# Patient Record
Sex: Male | Born: 1945 | Race: White | Hispanic: No | Smoking: Never smoker
Health system: Southern US, Community
[De-identification: ages and names within clinical notes are randomized; demographics above are authoritative.]

## PROBLEM LIST (undated history)

## (undated) DIAGNOSIS — F419 Anxiety disorder, unspecified: Secondary | ICD-10-CM

## (undated) DIAGNOSIS — I2089 Other forms of angina pectoris: Secondary | ICD-10-CM

## (undated) DIAGNOSIS — F32A Depression, unspecified: Secondary | ICD-10-CM

## (undated) DIAGNOSIS — I208 Other forms of angina pectoris: Secondary | ICD-10-CM

## (undated) DIAGNOSIS — F4310 Post-traumatic stress disorder, unspecified: Secondary | ICD-10-CM

## (undated) DIAGNOSIS — T7840XA Allergy, unspecified, initial encounter: Secondary | ICD-10-CM

## (undated) DIAGNOSIS — M545 Low back pain, unspecified: Secondary | ICD-10-CM

## (undated) DIAGNOSIS — I2510 Atherosclerotic heart disease of native coronary artery without angina pectoris: Secondary | ICD-10-CM

## (undated) DIAGNOSIS — R42 Dizziness and giddiness: Secondary | ICD-10-CM

## (undated) DIAGNOSIS — M1990 Unspecified osteoarthritis, unspecified site: Secondary | ICD-10-CM

## (undated) DIAGNOSIS — G9050 Complex regional pain syndrome I, unspecified: Secondary | ICD-10-CM

## (undated) DIAGNOSIS — N419 Inflammatory disease of prostate, unspecified: Secondary | ICD-10-CM

## (undated) DIAGNOSIS — L309 Dermatitis, unspecified: Secondary | ICD-10-CM

## (undated) DIAGNOSIS — M546 Pain in thoracic spine: Secondary | ICD-10-CM

## (undated) DIAGNOSIS — K219 Gastro-esophageal reflux disease without esophagitis: Secondary | ICD-10-CM

## (undated) HISTORY — DX: Dizziness and giddiness: R42

## (undated) HISTORY — PX: TONSILLECTOMY: SUR1361

## (undated) HISTORY — DX: Complex regional pain syndrome I, unspecified: G90.50

## (undated) HISTORY — DX: Low back pain, unspecified: M54.50

## (undated) HISTORY — DX: Allergy, unspecified, initial encounter: T78.40XA

## (undated) HISTORY — DX: Gastro-esophageal reflux disease without esophagitis: K21.9

## (undated) HISTORY — DX: Dermatitis, unspecified: L30.9

## (undated) HISTORY — DX: Pain in thoracic spine: M54.6

## (undated) HISTORY — DX: Depression, unspecified: F32.A

## (undated) HISTORY — DX: Other forms of angina pectoris: I20.8

## (undated) HISTORY — PX: NASAL SINUS SURGERY: SHX719

## (undated) HISTORY — PX: OTHER SURGICAL HISTORY: SHX169

## (undated) HISTORY — DX: Inflammatory disease of prostate, unspecified: N41.9

## (undated) HISTORY — PX: CHOLECYSTECTOMY: SHX55

## (undated) HISTORY — DX: Other forms of angina pectoris: I20.89

## (undated) HISTORY — PX: ESOPHAGEAL DILATION: SHX303

## (undated) HISTORY — DX: Low back pain: M54.5

## (undated) HISTORY — DX: Unspecified osteoarthritis, unspecified site: M19.90

## (undated) HISTORY — PX: BACK SURGERY: SHX140

---

## 1998-01-11 ENCOUNTER — Ambulatory Visit (HOSPITAL_COMMUNITY): Admission: RE | Admit: 1998-01-11 | Discharge: 1998-01-11 | Payer: Self-pay | Admitting: Neurological Surgery

## 1998-01-21 ENCOUNTER — Ambulatory Visit (HOSPITAL_COMMUNITY): Admission: RE | Admit: 1998-01-21 | Discharge: 1998-01-21 | Payer: Self-pay | Admitting: Neurological Surgery

## 1998-03-18 ENCOUNTER — Encounter: Admission: RE | Admit: 1998-03-18 | Discharge: 1998-06-16 | Payer: Self-pay | Admitting: Neurological Surgery

## 1998-04-12 ENCOUNTER — Encounter: Admission: RE | Admit: 1998-04-12 | Discharge: 1998-07-11 | Payer: Self-pay | Admitting: Anesthesiology

## 1998-10-09 ENCOUNTER — Encounter: Payer: Self-pay | Admitting: Neurological Surgery

## 1998-10-09 ENCOUNTER — Ambulatory Visit (HOSPITAL_COMMUNITY): Admission: RE | Admit: 1998-10-09 | Discharge: 1998-10-09 | Payer: Self-pay | Admitting: Neurological Surgery

## 1998-11-29 ENCOUNTER — Encounter: Admission: RE | Admit: 1998-11-29 | Discharge: 1999-02-27 | Payer: Self-pay

## 1998-12-12 ENCOUNTER — Encounter: Payer: Self-pay | Admitting: Emergency Medicine

## 1998-12-12 ENCOUNTER — Emergency Department (HOSPITAL_COMMUNITY): Admission: EM | Admit: 1998-12-12 | Discharge: 1998-12-12 | Payer: Self-pay | Admitting: Emergency Medicine

## 1999-02-21 ENCOUNTER — Encounter: Admission: RE | Admit: 1999-02-21 | Discharge: 1999-05-22 | Payer: Self-pay

## 2001-09-07 HISTORY — PX: OTHER SURGICAL HISTORY: SHX169

## 2016-07-21 ENCOUNTER — Ambulatory Visit: Payer: Self-pay | Admitting: Pediatrics

## 2016-09-22 ENCOUNTER — Ambulatory Visit (INDEPENDENT_AMBULATORY_CARE_PROVIDER_SITE_OTHER): Payer: Medicare Other | Admitting: Pediatrics

## 2016-09-22 ENCOUNTER — Encounter: Payer: Self-pay | Admitting: Pediatrics

## 2016-09-22 VITALS — BP 140/88 | HR 88 | Temp 98.0°F | Resp 24 | Ht 69.69 in | Wt 223.1 lb

## 2016-09-22 DIAGNOSIS — K219 Gastro-esophageal reflux disease without esophagitis: Secondary | ICD-10-CM | POA: Diagnosis not present

## 2016-09-22 DIAGNOSIS — L500 Allergic urticaria: Secondary | ICD-10-CM | POA: Diagnosis not present

## 2016-09-22 DIAGNOSIS — J3089 Other allergic rhinitis: Secondary | ICD-10-CM | POA: Diagnosis not present

## 2016-09-22 DIAGNOSIS — L282 Other prurigo: Secondary | ICD-10-CM

## 2016-09-22 DIAGNOSIS — I209 Angina pectoris, unspecified: Secondary | ICD-10-CM | POA: Diagnosis not present

## 2016-09-22 DIAGNOSIS — I208 Other forms of angina pectoris: Secondary | ICD-10-CM | POA: Diagnosis not present

## 2016-09-22 NOTE — Patient Instructions (Addendum)
Zyrtec 10 mg once a day for runny nose or itching Do foods with salicylates make you itch ? Environmental control of dust mite Triamcinolone 0.1% cream twice a day if needed to red itchy areas below the face Schedule your cardiac catheterization

## 2016-09-22 NOTE — Progress Notes (Signed)
[address] [place] [address] Dept: [phone]  New Patient Note  Patient ID: Cesar [name], Cesar [name]    DOB: 1946/04/05  Age: 71 y.o. MRN: [id number] Date of Office Visit: 09/22/2016 Referring provider: Brooke Bonito, MD No address on file    Chief Complaint: Pruritus (all over for about 20 years. sores on his legs and chest area. pt.states he noticed some itching when he came out of [place] exposed to agent orange.)  HPI Cesar [name] presents for evaluation of an itchy rash since 1970. He also has had eczema since then. He was exposed to agent orange in [place]. The itching comes and goes. He has had sinus problems for  several years which improved following sinus surgery. There are no clearcut symptoms to his rash. He has never had asthma. He has angina and has to take nitroglycerin 3 times a day. This may explain why his skin feels warm most of the time He was scheduled to have cardiac catheterization 2 months ago but it was canceled when he had a severe case of poison ivy.  Review of Systems  Constitutional:       Feels warm most of the time  HENT:       Sinus surgery. Occasional  nasal congestion and postnasal drainage. Tonsillectomy  Eyes: Negative.   Respiratory: Negative.   Cardiovascular:       Angina requiring nitroglycerin 3 times a day. He was scheduled for coronary angiography last fall but postponed because of poison ivy  Gastrointestinal:       Heartburn. 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 Carpal tunnel and  replacement of one joint in one thumb  Skin:       Itching and hives since 1970. History of eczema.  Neurological:       Lumbar surgery requiring a rod  Endo/Heme/Allergies:       No diabetes or thyroid disease  Psychiatric/Behavioral: Negative.     Outpatient Encounter Prescriptions as of 09/22/2016  Medication Sig  . acetaminophen (TYLENOL) 500 MG  tablet Take 500 mg by mouth every 6 (six) hours as needed.  . ALPRAZolam (XANAX) 0.5 MG tablet Take by mouth.  Marland Kitchen aspirin 81 MG chewable tablet Chew by mouth daily.  Marland Kitchen CALCIUM CARBONATE PO Take by mouth.  . Calcium-Vitamin D 600-200 MG-UNIT tablet Take by mouth.  . ciprofloxacin (CIPRO) 500 MG tablet Take 500 mg by mouth 2 (two) times daily. Pt. Takes as needed  . Naproxen Sodium (ALEVE PO) Take by mouth.  . neomycin-polymyxin-hydrocortisone (CORTISPORIN) 3.5-10000-1 otic suspension 4 drops two (2) times a day as needed (itching).  . nitroGLYCERIN (NITRODUR - DOSED IN MG/24 HR) 0.1 mg/hr patch Place onto the skin.  Marland Kitchen nitroGLYCERIN (NITROSTAT) 0.4 MG SL tablet Place 0.4 mg under the tongue.  Marland Kitchen omeprazole (PRILOSEC) 20 MG capsule Take by mouth.  . polyethylene glycol (MIRALAX / GLYCOLAX) packet 17 g by Does not apply route.  . triamcinolone cream (KENALOG) 0.1 % Apply to affected areas twice daily as needed  . [DISCONTINUED] aspirin EC 81 MG tablet Take by mouth.  . [DISCONTINUED] meloxicam (MOBIC) 7.5 MG tablet Take by mouth.   No facility-administered encounter medications on file as of 09/22/2016.      Drug Allergies:  Allergies  Allergen Reactions  . Formaldehyde Itching and Rash    SOLN  . Latex Itching  .  Sulfa Antibiotics Itching, Nausea And Vomiting and Rash    15 POWD SOLN  . Butorphanol Other (See Comments)    Drowsiness Drowsy Drowsiness  . Celecoxib Rash    Flushing Flushing  . Hydrocodone-Acetaminophen Other (See Comments) and Nausea And Vomiting  . Metformin Other (See Comments)    Unknown  . Oxycodone-Acetaminophen Nausea And Vomiting  . Povidone Iodine Rash  . Metformin Hcl Other (See Comments)    *blurred vision, 11/06/2009  . Simvastatin Other (See Comments)    Myalgias  . Tamsulosin Other (See Comments)    Dyspnea  . Nifedipine Nausea Only  . Nitroglycerin Rash    Nitroglycerin patch developed rash and blisters from adhesive and latex  . Povidone-Iodine  Rash  . Quaternium-15 Rash  . Rofecoxib Nausea Only    Flushing  . Tape Itching and Rash    Family History: Viyan's family history is not on file..Family history is negative for asthma, hayfever, sinus problems, angioedema, eczema, hives, and food allergies or lupus.  Social and environmental. There are no pets in the home. He is not exposed to cigarette smoking. He has never smoked cigarettes in the past. He was exposed to agent orange in the 1960s when he was in [place]  Physical Exam: BP 140/88 (BP Location: Right Arm, Patient Position: Sitting, Cuff Size: Normal)   Pulse 88   Temp 98 F (36.7 C) (Oral)   Resp (!) 24   Ht 5' 9.69" (1.77 m)   Wt 223 lb 1.7 oz (101.2 kg)   BMI 32.30 kg/m    Physical Exam  Constitutional: He is oriented to person, place, and time. He appears well-developed and well-nourished.  HENT:  Eyes normal. Ears normal. Nose normal. Pharynx normal.  Neck: Neck supple. No thyromegaly present.  Cardiovascular:  S1 and S2 normal no murmurs  Pulmonary/Chest:  Clear to percussion and  auscultation  Abdominal: Soft. There is no tenderness (no hepatosplenomegaly).  Neurological: He is alert and oriented to person, place, and time.  Skin:  Clear except for a few papular urticaria lesions. He also had dermographia  Psychiatric: He has a normal mood and affect. His behavior is normal. Judgment and thought content normal.  Vitals reviewed.   Diagnostics:  Allergy skin tests were positive to grass pollen and dust mites. Skin testing to foods was negative  Assessment  Assessment and Plan: 1. Allergic urticaria   2. Other allergic rhinitis   3. Angina at rest [facility](HCC)   4. Gastroesophageal reflux disease without esophagitis   5. Papular urticaria     No orders of the defined types were placed in this encounter.   Patient Instructions  Zyrtec 10 mg once a day for runny nose or itching Do foods with salicylates make you itch ? Environmental control of  dust mite Triamcinolone 0.1% cream twice a day if needed to red itchy areas below the face Schedule your cardiac catheterization   Return in about 4 weeks (around 10/20/2016).   Thank you for the opportunity to care for this patient.  Please do not hesitate to contact me with questions.  Tonette BihariJ. A. Sydny Schnitzler, M.D.  [facility] [address] [place], [place] [address] [phone]

## 2016-09-25 DIAGNOSIS — K219 Gastro-esophageal reflux disease without esophagitis: Secondary | ICD-10-CM | POA: Insufficient documentation

## 2016-09-25 DIAGNOSIS — J3089 Other allergic rhinitis: Secondary | ICD-10-CM | POA: Insufficient documentation

## 2016-09-25 DIAGNOSIS — L500 Allergic urticaria: Secondary | ICD-10-CM | POA: Insufficient documentation

## 2016-09-25 DIAGNOSIS — I208 Other forms of angina pectoris: Secondary | ICD-10-CM | POA: Insufficient documentation

## 2016-10-26 ENCOUNTER — Ambulatory Visit (INDEPENDENT_AMBULATORY_CARE_PROVIDER_SITE_OTHER): Payer: Medicare Other | Admitting: Pediatrics

## 2016-10-26 ENCOUNTER — Encounter: Payer: Self-pay | Admitting: Pediatrics

## 2016-10-26 ENCOUNTER — Ambulatory Visit: Payer: Medicare Other | Admitting: Pediatrics

## 2016-10-26 VITALS — BP 138/86 | HR 84 | Temp 98.2°F | Resp 18

## 2016-10-26 DIAGNOSIS — L503 Dermatographic urticaria: Secondary | ICD-10-CM | POA: Diagnosis not present

## 2016-10-26 DIAGNOSIS — L282 Other prurigo: Secondary | ICD-10-CM | POA: Diagnosis not present

## 2016-10-26 DIAGNOSIS — K219 Gastro-esophageal reflux disease without esophagitis: Secondary | ICD-10-CM | POA: Diagnosis not present

## 2016-10-26 DIAGNOSIS — I208 Other forms of angina pectoris: Secondary | ICD-10-CM | POA: Diagnosis not present

## 2016-10-26 DIAGNOSIS — L500 Allergic urticaria: Secondary | ICD-10-CM

## 2016-10-26 DIAGNOSIS — I209 Angina pectoris, unspecified: Secondary | ICD-10-CM | POA: Diagnosis not present

## 2016-10-26 NOTE — Progress Notes (Signed)
[address] [place] [address] Dept: [phone]  FOLLOW UP NOTE  Patient ID: Cesar [name], male    DOB: 1946/05/09  Age: 71 y.o. MRN: [id number] Date of Office Visit: 10/26/2016  Assessment  Chief Complaint: Follow-up (pt is doing well)  HPI Cesar PortsDonald C Vanschaick presents for follow-up of itchy skin rash. His symptoms are somewhat improved. He has been able to cut back nitroglycerin to twice a day. He has not had a cardiac catheterization as recommended by his cardiologist. He did find that foods with a lot of acids did make his skin itch more.  Current medications are Zyrtec 10 mg once a day and triamcinolone 0.1% cream twice a day if needed to red itchy areas below the face   Drug Allergies:  Allergies  Allergen Reactions  . Formaldehyde Itching and Rash    SOLN  . Latex Itching  . Sulfa Antibiotics Itching, Nausea And Vomiting and Rash    15 POWD SOLN  . Butorphanol Other (See Comments)    Drowsiness Drowsy Drowsiness  . Celecoxib Rash    Flushing Flushing  . Hydrocodone-Acetaminophen Other (See Comments) and Nausea And Vomiting  . Metformin Other (See Comments)    Unknown  . Oxycodone-Acetaminophen Nausea And Vomiting  . Povidone Iodine Rash  . Metformin Hcl Other (See Comments)    *blurred vision, 11/06/2009  . Simvastatin Other (See Comments)    Myalgias  . Tamsulosin Other (See Comments)    Dyspnea  . Nifedipine Nausea Only  . Nitroglycerin Rash    Nitroglycerin patch developed rash and blisters from adhesive and latex  . Povidone-Iodine Rash  . Quaternium-15 Rash  . Rofecoxib Nausea Only    Flushing  . Tape Itching and Rash    Physical Exam: BP 138/86   Pulse 84   Temp 98.2 F (36.8 C) (Oral)   Resp 18   SpO2 97%    Physical Exam  Constitutional: He is oriented to person, place, and time. He appears well-developed and well-nourished.  HENT:  Eyes normal. Ears normal. Nose normal. Pharynx normal.  Neck: Neck supple.  Cardiovascular:    S1 and S2 normal no murmurs  Pulmonary/Chest:  Clear to percussion and auscultation  Lymphadenopathy:    He has no cervical adenopathy.  Neurological: He is alert and oriented to person, place, and time.  Skin:  IA Few 2 mm papular erythematous areas in the back  Psychiatric: He has a normal mood and affect. His behavior is normal. Judgment and thought content normal.  Vitals reviewed.   Diagnostics:  none  Assessment and Plan: 1. Allergic urticaria   2. Dermographia   3. Papular urticaria   4. Angina at rest [facility](HCC)   5. Gastroesophageal reflux disease without esophagitis     No orders of the defined types were placed in this encounter.   Patient Instructions  Zyrtec 10 mg once a day Triamcinolone 0.1% cream twice a day if needed to red itchy areas below the face Avoid foods with a lot of acids Triamcinolone 0.1% cream twice a day if needed to red itchy areas Follow-up about a month after his cardiac catheterization which he plans to do within a month Continue on his other medications   Return in about 8 weeks (around 12/21/2016).    Thank you for the opportunity to care for this patient.  Please do not hesitate to contact me with questions.  Tonette BihariJ. A. Bardelas, M.D.  [facility] [place] [address]  [place],  [address] [phone]

## 2016-10-26 NOTE — Patient Instructions (Addendum)
Zyrtec 10 mg once a day Triamcinolone 0.1% cream twice a day if needed to red itchy areas below the face Avoid foods with a lot of acids Triamcinolone 0.1% cream twice a day if needed to red itchy areas Follow-up about a month after his cardiac catheterization which he plans to do within a month Continue on his other medications

## 2020-07-08 ENCOUNTER — Other Ambulatory Visit: Payer: Self-pay

## 2020-07-08 ENCOUNTER — Encounter: Payer: Self-pay | Admitting: Family Medicine

## 2020-07-08 ENCOUNTER — Ambulatory Visit (INDEPENDENT_AMBULATORY_CARE_PROVIDER_SITE_OTHER): Payer: Medicare Other | Admitting: Family Medicine

## 2020-07-08 VITALS — BP 130/72 | HR 69 | Temp 97.7°F | Ht 71.0 in | Wt 197.1 lb

## 2020-07-08 DIAGNOSIS — M545 Low back pain, unspecified: Secondary | ICD-10-CM | POA: Diagnosis not present

## 2020-07-08 DIAGNOSIS — G8929 Other chronic pain: Secondary | ICD-10-CM

## 2020-07-08 DIAGNOSIS — Z634 Disappearance and death of family member: Secondary | ICD-10-CM

## 2020-07-08 DIAGNOSIS — F411 Generalized anxiety disorder: Secondary | ICD-10-CM

## 2020-07-08 DIAGNOSIS — F4310 Post-traumatic stress disorder, unspecified: Secondary | ICD-10-CM | POA: Diagnosis not present

## 2020-07-08 MED ORDER — ESCITALOPRAM OXALATE 10 MG PO TABS: 10.0000 mg | ORAL_TABLET | Freq: Every day | ORAL | 3 refills | Status: DC

## 2020-07-08 NOTE — Patient Instructions (Addendum)
You are on quite a bit of Xanax. I want you to use it only when you need it rather than scheduled 3 times daily.   Aim to do some physical exertion for 150 minutes per week. This is typically divided into 5 days per week, 30 minutes per day. The activity should be enough to get your heart rate up. Anything is better than nothing if you have time constraints.  Please consider counseling. Contact [phone] to schedule an appointment or inquire about cost/insurance coverage.  Coping skills Choose 5 that work for you:  Take a deep breath  Count to 20  Read a book  Do a puzzle  Meditate  Bake  Sing  Knit  Garden  Pray  Go outside  Call a friend  Listen to music  Take a walk  Color  Send a note  Take a bath  Watch a movie  Be alone in a quiet place  Pet an animal  Visit a friend  Journal  Exercise  Stretch    [facility] [address] [place], [place] [address] [phone]  [facility] [address] [place], [place] [address] [phone]  [facility] health [address] [place], [place] [address] [phone]  [facility] [address], [place], [place], #[phone] [address], [place], [place], #[phone]  [facility] [address] [place], [place] [address] [phone]  [facility] [address] [place], [place] [phone]  [facility] Department [address] [place], [place] [phone]  Call one of these offices sooner than later as it can take 2-3 months to get a new patient appointment.
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
## 2020-07-10 ENCOUNTER — Telehealth: Payer: Self-pay | Admitting: Family Medicine

## 2020-07-10 MED ORDER — VENLAFAXINE HCL ER 37.5 MG PO CP24: 37.5000 mg | ORAL_CAPSULE | Freq: Every day | ORAL | 3 refills | Status: DC

## 2020-07-10 NOTE — Telephone Encounter (Signed)
I will send something else in. He stopped the Prozac though, correct?

## 2020-07-10 NOTE — Telephone Encounter (Signed)
Called the patient to inform. YES he has stopped the prozac He verbalized understanding.

## 2020-07-10 NOTE — Telephone Encounter (Signed)
Patient states he is not able to take escitalopram (LEXAPRO) 10 MG tablet   it caused heart racing lack of sleep Hot Flashes, caused light diarrhea  and will not be able to take it.

## 2020-07-11 ENCOUNTER — Telehealth: Payer: Self-pay | Admitting: Family Medicine

## 2020-07-11 NOTE — Telephone Encounter (Signed)
Patient states he has researched venlafine xr and its the same type of medication as the previous. Patient state he had a conversation with the pharmacist and he suggested maybe he had serotonin syndrome and that is the reason why he gets the side effects.  Patient states whenever he is about to get a panic/anxiety attack, he takes xanax and that seems to help. He is wondering if he should only continue to take xanax.

## 2020-07-12 NOTE — Telephone Encounter (Signed)
He states he had only been on prozac not even a couple months. He states in the past 3 days has taken nothing (only xanax if needed) He has had no sweats/heart racing--states feeling much better. He prefers to stay off everything other than xanax as needed but is feeling much better.

## 2020-07-12 NOTE — Telephone Encounter (Signed)
Prozac is also in the same family which is why I thought it was medication specific. He is free to stay on Xanax only, but if that is the case I will be referring to the psychiatry team to further manage because I do not think things are controlled, nor is taking that much Xanax per day my goal for him. Ty.

## 2020-07-29 ENCOUNTER — Telehealth: Payer: Self-pay

## 2020-07-29 ENCOUNTER — Ambulatory Visit (INDEPENDENT_AMBULATORY_CARE_PROVIDER_SITE_OTHER): Payer: Medicare Other | Admitting: Family Medicine

## 2020-07-29 ENCOUNTER — Telehealth: Payer: Self-pay | Admitting: *Deleted

## 2020-07-29 ENCOUNTER — Encounter: Payer: Self-pay | Admitting: Family Medicine

## 2020-07-29 ENCOUNTER — Other Ambulatory Visit: Payer: Self-pay

## 2020-07-29 VITALS — BP 128/72 | HR 78 | Temp 97.7°F | Ht 72.0 in | Wt 193.4 lb

## 2020-07-29 DIAGNOSIS — F411 Generalized anxiety disorder: Secondary | ICD-10-CM

## 2020-07-29 DIAGNOSIS — F4310 Post-traumatic stress disorder, unspecified: Secondary | ICD-10-CM

## 2020-07-29 MED ORDER — HYDROXYZINE HCL 25 MG PO TABS: 12.5000 mg | ORAL_TABLET | Freq: Three times a day (TID) | ORAL | 1 refills | Status: DC | PRN

## 2020-07-29 MED ORDER — NORTRIPTYLINE HCL 10 MG PO CAPS: 10.0000 mg | ORAL_CAPSULE | Freq: Every day | ORAL | 2 refills | Status: DC

## 2020-07-29 NOTE — Telephone Encounter (Signed)
Patient scheduled with you this afternoon at 245pm

## 2020-07-29 NOTE — Telephone Encounter (Addendum)
Contact Type Call Who Is Calling Patient / Member / Family / Caregiver Call Type Triage / Clinical Relationship To Patient Self Return Phone Number [phone] (Primary) Chief Complaint Anxiety and Panic Attack Reason for Call Request to Schedule Office Appointment Initial Comment Caller states he wanted to know if he can get an appointment on today if possible. Caller states he is having serve anxiety this morning. Caller states the anxierty began after his wife passed in July. Translation No Nurse Assessment Nurse: Vear Clock, RN, Elease Hashimoto Date/Time Cesar [name] Time): 07/29/2020 8:35:29 AM Confirm and document reason for call. If symptomatic, describe symptoms. ---He would like an appointment on today if possible. He is having serve anxiety this morning. Caller states the anxiety began after his wife passed in July. He has seen the MD for it and the MD gave him some medication but it did not help . He states it made hi "heart race". He does take Xanax but it does not help with the anxiety.

## 2020-07-29 NOTE — Telephone Encounter (Signed)
PA initiated via Covermymeds; KEY:  [id number]. Awaiting determination.

## 2020-07-29 NOTE — Patient Instructions (Signed)
Please consider counseling. Contact [phone] to schedule an appointment or inquire about cost/insurance coverage.  Aim to do some physical exertion for 150 minutes per week. This is typically divided into 5 days per week, 30 minutes per day. The activity should be enough to get your heart rate up. Anything is better than nothing if you have time constraints.  [facility] [address] [place], [place] [address] [phone]  [facility] [address] [place], [place] [address] [phone]  [facility] health [address] [place], [place] [address] [phone]  [facility] Medicine [address], [place], [place], #[phone] [address], [place], [place], #[phone]  [facility] [address] [place], [place] [address] [phone]  [facility] and Counseling [address] [place], [place] [phone]  [facility] [address] [place], [place] [phone]  Call one of these offices sooner than later as it can take 2-3 months to get a new patient appointment.

## 2020-07-29 NOTE — Progress Notes (Signed)
Chief Complaint  Patient presents with  . Anxiety    Subjective Cesar [name] presents for f/u anxiety/PTSD.  Pt is currently being treated with: nothing, just stopped Prozac, Effexor and Lexapro; is on Xanax prn.  Reports lots of anxiety and panic attacks since treatment. No thoughts of harming self or others. No self-medication with alcohol, prescription drugs or illicit drugs. Pt is not following with a counselor/psychologist.  Past Medical History:  Diagnosis Date  . Allergy   . Angina at rest [facility]   . Arthritis   . Back pain, lumbosacral   . Back pain, thoracic   . Depression   . Eczema   . GERD (gastroesophageal reflux disease)   . Prostatitis   . RSD (reflex sympathetic dystrophy)    on the right side  . Vertigo    Allergies as of 07/29/2020      Reactions   Formaldehyde Itching, Rash   SOLN   Latex Itching   Sulfa Antibiotics Itching, Nausea And Vomiting, Rash   15 POWD SOLN   Butorphanol Other (See Comments)   Drowsiness Drowsy Drowsiness   Celecoxib Rash   Flushing Flushing   Hydrocodone-acetaminophen Other (See Comments), Nausea And Vomiting   Metformin Other (See Comments)   Unknown   Oxycodone-acetaminophen Nausea And Vomiting   Povidone Iodine Rash   Metformin Hcl Other (See Comments)   *blurred vision, 11/06/2009   Simvastatin Other (See Comments)   Myalgias   Tamsulosin Other (See Comments)   Dyspnea   Nifedipine Nausea Only   Nitroglycerin Rash   Nitroglycerin patch developed rash and blisters from adhesive and latex   Povidone-iodine Rash   Quaternium-15 Rash   Rofecoxib Nausea Only   Flushing   Tape Itching, Rash      Medication List       Accurate as of July 29, 2020  3:55 PM. If you have any questions, ask your nurse or doctor.        STOP taking these medications   ALPRAZolam 0.5 MG tablet Commonly known as: XANAX Stopped by: Sharlene Dory, DO   venlafaxine XR 37.5 MG 24 hr capsule Commonly known as:  Effexor XR Stopped by: Sharlene Dory, DO     TAKE these medications   aspirin 81 MG chewable tablet Chew by mouth daily.   Calcium-Vitamin D 600-200 MG-UNIT tablet Take by mouth.   ciprofloxacin 500 MG tablet Commonly known as: CIPRO Take 500 mg by mouth 2 (two) times daily. Pt. Takes as needed   hydrOXYzine 25 MG tablet Commonly known as: ATARAX/VISTARIL Take 0.5-1 tablets (12.5-25 mg total) by mouth every 8 (eight) hours as needed for anxiety. Started by: Sharlene Dory, DO   metoprolol tartrate 25 MG tablet Commonly known as: LOPRESSOR Take 1/2 twice daily   nitroGLYCERIN 0.4 MG SL tablet Commonly known as: NITROSTAT Place 0.4 mg under the tongue.   nortriptyline 10 MG capsule Commonly known as: Pamelor Take 1 capsule (10 mg total) by mouth at bedtime. Started by: Sharlene Dory, DO   omeprazole 20 MG capsule Commonly known as: PRILOSEC Take by mouth.   polyethylene glycol 17 g packet Commonly known as: MIRALAX / GLYCOLAX 17 g by Does not apply route.   triamcinolone 0.1 % Commonly known as: KENALOG Apply to affected areas twice daily as needed   TYLENOL 500 MG tablet Generic drug: acetaminophen Take 500 mg by mouth every 6 (six) hours as needed.       Exam BP 128/72 (BP  Location: Left Arm, Patient Position: Sitting, Cuff Size: Normal)   Pulse 78   Temp 97.7 F (36.5 C) (Oral)   Ht 6' (1.829 m)   Wt 193 lb 6 oz (87.7 kg)   SpO2 98%   BMI 26.23 kg/m  General:  well developed, well nourished, in no apparent distress Lungs:  No respiratory distress Psych: well oriented with normal range of affect and age-appropriate judgement/insight, alert and oriented x4.  Assessment and Plan  PTSD (post-traumatic stress disorder) - Plan: hydrOXYzine (ATARAX/VISTARIL) 25 MG tablet, nortriptyline (PAMELOR) 10 MG capsule  GAD (generalized anxiety disorder) - Plan: hydrOXYzine (ATARAX/VISTARIL) 25 MG tablet, nortriptyline (PAMELOR) 10 MG  capsule  Status: Chronic, uncontrolled.  Stop Effexor, start nortriptyline 10 mg daily.  We will also stop alprazolam and start hydroxyzine 12.5-25 mg every 8 hours as needed for anxiety/panic attacks.  I also gave him psychiatry resources to reach out to.  It should take around 3 months to get and so we will see him back in around 1 month.  Could consider Abilify versus BuSpar as an adjunct versus mirtazapine. The patient voiced understanding and agreement to the plan.  [name] [place], DO 07/29/20 3:55 PM

## 2020-07-29 NOTE — Telephone Encounter (Addendum)
PA approved. Effective 04/30/2020 to 07/29/2021

## 2020-08-07 ENCOUNTER — Ambulatory Visit: Payer: Medicare Other | Admitting: Family Medicine

## 2020-08-14 ENCOUNTER — Telehealth: Payer: Self-pay | Admitting: Family Medicine

## 2020-08-14 NOTE — Telephone Encounter (Signed)
Patient states he has been having lots of anxiety, nervousness. Patient states he is not able to sleep. He is not sure the medication is helping. He is wondering if maybe medication needs to be adjusted.

## 2020-08-14 NOTE — Telephone Encounter (Signed)
Called and scheduled appt for 08/19/20

## 2020-08-14 NOTE — Telephone Encounter (Signed)
Schedule in next week or so. Ty.

## 2020-08-19 ENCOUNTER — Other Ambulatory Visit (HOSPITAL_BASED_OUTPATIENT_CLINIC_OR_DEPARTMENT_OTHER): Payer: Self-pay | Admitting: Internal Medicine

## 2020-08-19 ENCOUNTER — Other Ambulatory Visit: Payer: Self-pay

## 2020-08-19 ENCOUNTER — Ambulatory Visit: Payer: Medicare Other | Attending: Internal Medicine

## 2020-08-19 ENCOUNTER — Encounter: Payer: Self-pay | Admitting: Family Medicine

## 2020-08-19 ENCOUNTER — Ambulatory Visit (INDEPENDENT_AMBULATORY_CARE_PROVIDER_SITE_OTHER): Payer: Medicare Other | Admitting: Family Medicine

## 2020-08-19 VITALS — BP 134/74 | HR 78 | Temp 98.3°F | Ht 72.0 in | Wt 190.4 lb

## 2020-08-19 DIAGNOSIS — Z23 Encounter for immunization: Secondary | ICD-10-CM

## 2020-08-19 DIAGNOSIS — F411 Generalized anxiety disorder: Secondary | ICD-10-CM

## 2020-08-19 DIAGNOSIS — F4310 Post-traumatic stress disorder, unspecified: Secondary | ICD-10-CM | POA: Diagnosis not present

## 2020-08-19 MED ORDER — OMEPRAZOLE 20 MG PO CPDR
20.0000 mg | DELAYED_RELEASE_CAPSULE | Freq: Every day | ORAL | 1 refills | Status: DC
Start: 2020-08-19 — End: 2021-03-07

## 2020-08-19 MED ORDER — ARIPIPRAZOLE 2 MG PO TABS: 2.0000 mg | ORAL_TABLET | Freq: Every day | ORAL | 2 refills | Status: DC

## 2020-08-19 MED ORDER — HYDROXYZINE HCL 25 MG PO TABS: 12.5000 mg | ORAL_TABLET | Freq: Three times a day (TID) | ORAL | 1 refills | Status: DC | PRN

## 2020-08-19 NOTE — Progress Notes (Signed)
Chief Complaint  Patient presents with  . Anxiety  . Insomnia    Subjective: Patient is a 74 y.o. male here for follow up.  Patient is being treated for posttraumatic stress disorder.  We recently started him on nortriptyline 10 mg nightly and changed his Xanax to hydroxyzine.  He noticed no improvement with the former but feels the latter is quite helpful.  He is not experiencing side effects with either medication.  He would like a refill of the hydroxyzine.  He has failed Lexapro, Effexor, and Prozac.  [facility] and he is going to start face timing a therapist several times per week.  He is unsure if he is going to see a psychiatrist.    Past Medical History:  Diagnosis Date  . Allergy   . Angina at rest [facility])   . Arthritis   . Back pain, lumbosacral   . Back pain, thoracic   . Depression   . Eczema   . GERD (gastroesophageal reflux disease)   . Prostatitis   . RSD (reflex sympathetic dystrophy)    on the right side  . Vertigo     Objective: BP 134/74 (BP Location: Left Arm, Patient Position: Sitting, Cuff Size: Normal)   Pulse 78   Temp 98.3 F (36.8 C) (Oral)   Ht 6' (1.829 m)   Wt 190 lb 6 oz (86.4 kg)   SpO2 98%   BMI 25.82 kg/m  General: Awake, appears stated age Heart: RRR Lungs: CTAB, no rales, wheezes or rhonchi. No accessory muscle use Psych: Age appropriate judgment and insight, normal affect and mood  Assessment and Plan: PTSD (post-traumatic stress disorder) - Plan: hydrOXYzine (ATARAX/VISTARIL) 25 MG tablet, ARIPiprazole (ABILIFY) 2 MG tablet  GAD (generalized anxiety disorder) - Plan: hydrOXYzine (ATARAX/VISTARIL) 25 MG tablet, ARIPiprazole (ABILIFY) 2 MG tablet  Status: Uncontrolled; continue hydroxyzine as needed.  Stop nortriptyline.  Start Abilify 2 mg daily as he does not seem to have done well with serotonin altering agents.  Could consider Cymbalta, mirtazapine, Seroquel, or BuSpar.  He will send me a message/call in 2 weeks and  we will consider increasing the dosage from 2 mg daily to 5 mg daily.  I will see him in 4 weeks. The patient voiced understanding and agreement to the plan.  [name] [place], DO 08/19/20  11:48 AM

## 2020-08-19 NOTE — Patient Instructions (Signed)
Aim to do some physical exertion for 150 minutes per week. This is typically divided into 5 days per week, 30 minutes per day. The activity should be enough to get your heart rate up. Anything is better than nothing if you have time constraints.  Let me know if you aren't doing well with the new medicine in the next 10-14 days.  [facility] about [facility] (a physician who can help with medication).  Let us know if you need anything.

## 2020-08-26 ENCOUNTER — Telehealth: Payer: Self-pay | Admitting: Family Medicine

## 2020-08-26 MED ORDER — QUETIAPINE FUMARATE 25 MG PO TABS: 25.0000 mg | ORAL_TABLET | Freq: Every day | ORAL | 3 refills | Status: DC

## 2020-08-26 MED FILL — PFIZER-BIONTECH COVID-19 VA: 30 | 1 days supply | Qty: 0 | Fill #0

## 2020-08-26 NOTE — Telephone Encounter (Signed)
Patient states medication (Ability)  is making him have an allergic reaction. Patient states he is nauseated. Patient states after talking with the pharmacy they recommend him go back on Nortriptyline.   Please Advise

## 2020-08-26 NOTE — Telephone Encounter (Signed)
Patient informed of PCP response/instructions. He stated the [facility] is working on getting [facility] set up. He is going to follow-up with them on that as well.

## 2020-08-26 NOTE — Telephone Encounter (Signed)
Nausea alone or other allergic symptoms?

## 2020-08-26 NOTE — Telephone Encounter (Signed)
ALSO he did say he would be willing to go back on the previous one he tried as he did not feel nearly this bad with it.

## 2020-08-26 NOTE — Telephone Encounter (Signed)
OK, I will send in a different medication. I want him to set up with psychiatry at this point as we are running low on medication options. Ty.

## 2020-08-26 NOTE — Telephone Encounter (Signed)
Patient stated he is having bad headaches/nausea and dizziness Patient normally has vertigo but this dizziness seems different His pharmacist thought maybe it was a reaction

## 2020-08-26 NOTE — Telephone Encounter (Signed)
Patient states call the home first then cell , patient may be away this afternoon  Cell :  [phone]

## 2020-09-02 ENCOUNTER — Other Ambulatory Visit: Payer: Self-pay

## 2020-09-02 NOTE — Telephone Encounter (Signed)
That's fine, please update med list. Ty.

## 2020-09-02 NOTE — Telephone Encounter (Signed)
Patient states that he would like to stay on the Nortriptyline for anxiety. Patient states it is working for him at this time. Patient states he did not pick up the other medication sent to pharmacy. Patient would like to know if ok to continue taking Nortriptyline.   Please advise

## 2020-09-02 NOTE — Telephone Encounter (Signed)
Please advise 

## 2020-09-02 NOTE — Telephone Encounter (Signed)
Patient would like to be called back at [phone]

## 2020-09-09 ENCOUNTER — Ambulatory Visit (INDEPENDENT_AMBULATORY_CARE_PROVIDER_SITE_OTHER): Payer: Medicare Other | Admitting: Family Medicine

## 2020-09-09 ENCOUNTER — Other Ambulatory Visit: Payer: Self-pay

## 2020-09-09 ENCOUNTER — Encounter: Payer: Self-pay | Admitting: Family Medicine

## 2020-09-09 VITALS — BP 118/80 | HR 91 | Temp 97.7°F | Resp 18 | Ht 72.0 in | Wt 194.6 lb

## 2020-09-09 DIAGNOSIS — F4310 Post-traumatic stress disorder, unspecified: Secondary | ICD-10-CM

## 2020-09-09 DIAGNOSIS — F411 Generalized anxiety disorder: Secondary | ICD-10-CM | POA: Diagnosis not present

## 2020-09-09 MED ORDER — NORTRIPTYLINE HCL 10 MG PO CAPS: 20.0000 mg | ORAL_CAPSULE | Freq: Every day | ORAL | 2 refills | Status: DC

## 2020-09-09 NOTE — Progress Notes (Signed)
Chief Complaint  Patient presents with  . Post-Traumatic Stress Disorder    Pt states the Nortriptyline has been working "okay". Pt states no sides effects.   . Follow-up    Subjective Cesar [name] presents for f/u anxiety/depression.  Pt is currently being treated with nortriptyline 10 mg qhs. He uses around 1.5 tabs of hydroxyzine Reports around 50% improvement since treatment. No thoughts of harming self or others. No self-medication with alcohol, prescription drugs or illicit drugs. Pt is following with a counselor/psychologist.  Past Medical History:  Diagnosis Date  . Allergy   . Angina at rest [facility])   . Arthritis   . Back pain, lumbosacral   . Back pain, thoracic   . Depression   . Eczema   . GERD (gastroesophageal reflux disease)   . Prostatitis   . RSD (reflex sympathetic dystrophy)    on the right side  . Vertigo    Allergies as of 09/09/2020      Reactions   Formaldehyde Itching, Rash   SOLN   Latex Itching   Sulfa Antibiotics Itching, Nausea And Vomiting, Rash   15 POWD SOLN   Butorphanol Other (See Comments)   Drowsiness Drowsy Drowsiness   Celecoxib Rash   Flushing Flushing   Hydrocodone-acetaminophen Other (See Comments), Nausea And Vomiting   Metformin Other (See Comments)   Unknown   Oxycodone-acetaminophen Nausea And Vomiting   Povidone Iodine Rash   Atarax [hydroxyzine] Nausea Only   H/A, Dizziness   Metformin Hcl Other (See Comments)   *blurred vision, 11/06/2009   Simvastatin Other (See Comments)   Myalgias   Tamsulosin Other (See Comments)   Dyspnea   Nifedipine Nausea Only   Nitroglycerin Rash   Nitroglycerin patch developed rash and blisters from adhesive and latex   Povidone-iodine Rash   Quaternium-15 Rash   Rofecoxib Nausea Only   Flushing   Tape Itching, Rash      Medication List       Accurate as of September 09, 2020  2:10 PM. If you have any questions, ask your nurse or doctor.        STOP taking these medications    ciprofloxacin 500 MG tablet Commonly known as: CIPRO Stopped by: Sharlene Dory, DO   QUEtiapine 25 MG tablet Commonly known as: SEROquel Stopped by: Sharlene Dory, DO     TAKE these medications   acetaminophen 500 MG tablet Commonly known as: TYLENOL Take 500 mg by mouth every 6 (six) hours as needed.   aspirin 81 MG chewable tablet Chew by mouth daily.   Calcium-Vitamin D 600-200 MG-UNIT tablet Take by mouth.   hydrOXYzine 25 MG tablet Commonly known as: ATARAX/VISTARIL Take 0.5-1 tablets (12.5-25 mg total) by mouth every 8 (eight) hours as needed for anxiety.   metoprolol tartrate 25 MG tablet Commonly known as: LOPRESSOR Take 1/2 twice daily   nitroGLYCERIN 0.4 MG SL tablet Commonly known as: NITROSTAT Place 0.4 mg under the tongue.   omeprazole 20 MG capsule Commonly known as: PRILOSEC Take 1 capsule (20 mg total) by mouth daily.   polyethylene glycol 17 g packet Commonly known as: MIRALAX / GLYCOLAX 17 g by Does not apply route.   triamcinolone 0.1 % Commonly known as: KENALOG Apply to affected areas twice daily as needed       Exam BP 118/80 (BP Location: Left Arm, Patient Position: Sitting, Cuff Size: Normal)   Pulse 91   Temp 97.7 F (36.5 C) (Oral)   Resp 18  Ht 6' (1.829 m)   Wt 194 lb 9.6 oz (88.3 kg)   SpO2 99%   BMI 26.39 kg/m  General:  well developed, well nourished, in no apparent distress Lungs:  No respiratory distress Psych: well oriented with normal range of affect and age-appropriate judgement/insight, alert and oriented x4.  Assessment and Plan  GAD (generalized anxiety disorder)  PTSD (post-traumatic stress disorder)  Increase dosage of nortriptyline from 10 mg qhs to 20 mg qhs. Cont hydroxyzine prn.  F/u in 6 weeks. The patient voiced understanding and agreement to the plan.  [name] [place], DO 09/09/20 2:10 PM

## 2020-09-09 NOTE — Patient Instructions (Signed)
Take 2 caps nightly of the nortriptyline.  Continue with the counseling team.  Let us know if you need anything.

## 2020-10-22 ENCOUNTER — Encounter: Payer: Self-pay | Admitting: Family Medicine

## 2020-10-22 ENCOUNTER — Ambulatory Visit (INDEPENDENT_AMBULATORY_CARE_PROVIDER_SITE_OTHER): Payer: Medicare Other | Admitting: Family Medicine

## 2020-10-22 ENCOUNTER — Other Ambulatory Visit: Payer: Self-pay

## 2020-10-22 VITALS — BP 120/64 | HR 78 | Temp 97.9°F | Ht 66.0 in | Wt 195.5 lb

## 2020-10-22 DIAGNOSIS — F411 Generalized anxiety disorder: Secondary | ICD-10-CM

## 2020-10-22 DIAGNOSIS — R42 Dizziness and giddiness: Secondary | ICD-10-CM | POA: Diagnosis not present

## 2020-10-22 DIAGNOSIS — F4310 Post-traumatic stress disorder, unspecified: Secondary | ICD-10-CM | POA: Diagnosis not present

## 2020-10-22 DIAGNOSIS — I951 Orthostatic hypotension: Secondary | ICD-10-CM

## 2020-10-22 MED ORDER — MIDODRINE HCL 5 MG PO TABS: 5.0000 mg | ORAL_TABLET | Freq: Two times a day (BID) | ORAL | 1 refills | Status: DC

## 2020-10-22 MED ORDER — HYDROXYZINE HCL 25 MG PO TABS: 12.5000 mg | ORAL_TABLET | Freq: Three times a day (TID) | ORAL | 1 refills | Status: DC | PRN

## 2020-10-22 NOTE — Patient Instructions (Signed)
We will be in touch regarding what we need to do.  Stay hydrated.  Let me know if you need refills.  Let us know if you need anything.

## 2020-10-22 NOTE — Progress Notes (Signed)
Chief Complaint  Patient presents with  . Follow-up    Subjective Cesar [name] presents for f/u anxiety/depression.  Pt is currently being treated with Pamelor 20 mg qhs and 25-37.5 mg q 8 hrs prn.  Reports doing fairly since treatment. No thoughts of harming self or others. No self-medication with alcohol, prescription drugs or illicit drugs. Pt is following with a counselor/psychologist.  Lightheadedness 10 yrs, not improving or worsening. Old doc said he needs to get up slowly. He tried increasing his hydration.   Past Medical History:  Diagnosis Date  . Allergy   . Angina at rest [facility])   . Arthritis   . Back pain, lumbosacral   . Back pain, thoracic   . Depression   . Eczema   . GERD (gastroesophageal reflux disease)   . Prostatitis   . RSD (reflex sympathetic dystrophy)    on the right side  . Vertigo    Allergies as of 10/22/2020      Reactions   Formaldehyde Itching, Rash   SOLN   Latex Itching   Sulfa Antibiotics Itching, Nausea And Vomiting, Rash   15 POWD SOLN   Butorphanol Other (See Comments)   Drowsiness Drowsy Drowsiness   Celecoxib Rash   Flushing Flushing   Hydrocodone-acetaminophen Other (See Comments), Nausea And Vomiting   Metformin Other (See Comments)   Unknown   Oxycodone-acetaminophen Nausea And Vomiting   Povidone Iodine Rash   Metformin Hcl Other (See Comments)   *blurred vision, 11/06/2009   Simvastatin Other (See Comments)   Myalgias   Tamsulosin Other (See Comments)   Dyspnea   Nifedipine Nausea Only   Nitroglycerin Rash   Nitroglycerin patch developed rash and blisters from adhesive and latex   Povidone-iodine Rash   Quaternium-15 Rash   Rofecoxib Nausea Only   Flushing   Tape Itching, Rash      Medication List       Accurate as of October 22, 2020  3:29 PM. If you have any questions, ask your nurse or doctor.        acetaminophen 500 MG tablet Commonly known as: TYLENOL Take 500 mg by mouth every 6 (six) hours  as needed.   aspirin 81 MG chewable tablet Chew by mouth daily.   Calcium-Vitamin D 600-200 MG-UNIT tablet Take by mouth.   hydrOXYzine 25 MG tablet Commonly known as: ATARAX/VISTARIL Take 0.5-1 tablets (12.5-25 mg total) by mouth every 8 (eight) hours as needed for anxiety.   metoprolol tartrate 25 MG tablet Commonly known as: LOPRESSOR Take 1/2 twice daily   midodrine 5 MG tablet Commonly known as: PROAMATINE Take 1 tablet (5 mg total) by mouth 2 (two) times daily with a meal. Started by: Sharlene Dory, DO   nitroGLYCERIN 0.4 MG SL tablet Commonly known as: NITROSTAT Place 0.4 mg under the tongue.   nortriptyline 10 MG capsule Commonly known as: Pamelor Take 2 capsules (20 mg total) by mouth at bedtime.   omeprazole 20 MG capsule Commonly known as: PRILOSEC Take 1 capsule (20 mg total) by mouth daily.   polyethylene glycol 17 g packet Commonly known as: MIRALAX / GLYCOLAX 17 g by Does not apply route.   triamcinolone 0.1 % Commonly known as: KENALOG Apply to affected areas twice daily as needed       Exam BP 120/64 (BP Location: Left Arm, Patient Position: Sitting, Cuff Size: Normal)   Pulse 78   Temp 97.9 F (36.6 C) (Oral)   Ht 5\' 6"  (1.676 m)  Wt 195 lb 8 oz (88.7 kg)   SpO2 98%   BMI 31.55 kg/m  General:  well developed, well nourished, in no apparent distress Lungs:  No respiratory distress Psych: well oriented with normal range of affect and age-appropriate judgement/insight, alert and oriented x4.  Assessment and Plan  PTSD (post-traumatic stress disorder) - Plan: hydrOXYzine (ATARAX/VISTARIL) 25 MG tablet  GAD (generalized anxiety disorder) - Plan: hydrOXYzine (ATARAX/VISTARIL) 25 MG tablet  Lightheadedness  Orthostatic hypotension - Plan: midodrine (PROAMATINE) 5 MG tablet  1/2.  Continue Atarax as needed in addition to nortriptyline 20 mg daily.  We will continue with the counseling team. 3/4.  Systolic blood pressure dropped  by 20 mmHg with position change.  We will start midodrine twice daily, 5 mg.  I will see him in 1 month. The patient voiced understanding and agreement to the plan.  [name] [place], DO 10/22/20 3:29 PM

## 2020-11-20 ENCOUNTER — Encounter: Payer: Self-pay | Admitting: Family Medicine

## 2020-11-20 ENCOUNTER — Ambulatory Visit (INDEPENDENT_AMBULATORY_CARE_PROVIDER_SITE_OTHER): Payer: Medicare Other | Admitting: Family Medicine

## 2020-11-20 ENCOUNTER — Other Ambulatory Visit: Payer: Self-pay

## 2020-11-20 VITALS — BP 94/60 | HR 81 | Temp 97.7°F | Resp 18 | Ht 66.0 in | Wt 194.2 lb

## 2020-11-20 DIAGNOSIS — I951 Orthostatic hypotension: Secondary | ICD-10-CM | POA: Diagnosis not present

## 2020-11-20 DIAGNOSIS — F411 Generalized anxiety disorder: Secondary | ICD-10-CM | POA: Diagnosis not present

## 2020-11-20 DIAGNOSIS — F4310 Post-traumatic stress disorder, unspecified: Secondary | ICD-10-CM | POA: Diagnosis not present

## 2020-11-20 NOTE — Patient Instructions (Addendum)
Stay hydrated.  Stay active.  Get outside as much as possible.  Continue to get up slowly.   Let us know if you need anything.

## 2020-11-20 NOTE — Progress Notes (Signed)
Chief Complaint  Patient presents with  . Anxiety  . Depression  . Follow-up    Subjective: Patient is a 75 y.o. male here for f/u orthostatis.  Found to have orthostatic hypotension 1 mo ago. He was started on midodrine 5 mg bid. He reports compliance and no AE's. Has not had many dizzy spells since starting. He has been more careful also. Has not fainted either.   He was having some trouble sleeping and started taking hydroxyzine with the nortriptyline 20 mg. No AE's from this. It worked much better. He is following w a Veterinary surgeon thru the [place].   Past Medical History:  Diagnosis Date  . Allergy   . Angina at rest [facility])   . Arthritis   . Back pain, lumbosacral   . Back pain, thoracic   . Depression   . Eczema   . GERD (gastroesophageal reflux disease)   . Prostatitis   . RSD (reflex sympathetic dystrophy)    on the right side  . Vertigo     Objective: BP 94/60 (BP Location: Left Arm, Patient Position: Sitting, Cuff Size: Normal)   Pulse 81   Temp 97.7 F (36.5 C) (Oral)   Resp 18   Ht 5\' 6"  (1.676 m)   Wt 194 lb 3.2 oz (88.1 kg)   SpO2 98%   BMI 31.34 kg/m  General: Awake, appears stated age HEENT: MMM Heart: RRR, no murmurs Lungs: CTAB, no rales, wheezes or rhonchi. No accessory muscle use Psych: Age appropriate judgment and insight, normal affect and mood  Assessment and Plan: Orthostatic hypotension  PTSD (post-traumatic stress disorder)  GAD (generalized anxiety disorder)  1. Cont midodrine 5 mg bid. Stay hydrated. Get up slowly.  2/3. PHQ9 is 16. He is on treatment and reports overall stability. He does not wish to change anything right now. Cont Pamelor 20 mg qhs, hydroxyzine 12.5-25 mg q 8 hrs prn. Cont w therapist.  F/u in 6 mo or prn.  The patient voiced understanding and agreement to the plan.  10-07-1983 [place], DO 11/20/20  1:52 PM    j

## 2020-12-30 ENCOUNTER — Ambulatory Visit (INDEPENDENT_AMBULATORY_CARE_PROVIDER_SITE_OTHER): Payer: Medicare Other | Admitting: Family Medicine

## 2020-12-30 ENCOUNTER — Encounter: Payer: Self-pay | Admitting: Family Medicine

## 2020-12-30 ENCOUNTER — Other Ambulatory Visit: Payer: Self-pay

## 2020-12-30 VITALS — BP 120/78 | HR 85 | Temp 97.5°F | Ht 72.0 in | Wt 193.0 lb

## 2020-12-30 DIAGNOSIS — F4310 Post-traumatic stress disorder, unspecified: Secondary | ICD-10-CM

## 2020-12-30 DIAGNOSIS — F411 Generalized anxiety disorder: Secondary | ICD-10-CM

## 2020-12-30 DIAGNOSIS — I208 Other forms of angina pectoris: Secondary | ICD-10-CM | POA: Diagnosis not present

## 2020-12-30 MED ORDER — ALPRAZOLAM 0.5 MG PO TABS: 0.5000 mg | ORAL_TABLET | Freq: Three times a day (TID) | ORAL | 1 refills | Status: DC | PRN

## 2020-12-30 NOTE — Patient Instructions (Addendum)
Continue with the counseling team through the [place].  Keep the diet clean and stay active.  Try not to take the medication as scheduled, but only when you need it.   Please consider counseling. Contact [phone] to schedule an appointment or inquire about cost/insurance coverage.  Let us know if you need anything.  [facility] [address] [place], [place] [address] [phone]  [facility] [address] [place], [place] [address] [phone]  [facility] [address] [place], [place] [address] [phone]  [facility] [address], [place], [place], [phone] [address], [place], [place], [phone]  [facility] [address] [place], [place] [address] [phone]  [facility] [address] [place], [place] [phone]  [facility] [address] [place], [place] [phone]  Call one of these offices sooner than later as it can take 2-3 months to get a new patient appointment.    [facility] Ask your health care provider which exercises are safe for you. Do exercises exactly as told by your health care provider and adjust them as directed. It is normal to feel mild stretching, pulling, tightness, or discomfort as you do these exercises, but you should stop right away if you feel sudden pain or your pain gets worse.Do not begin these exercises until told by your health care provider. Stretching and range of motion exercises These exercises warm up your muscles and joints and improve the movement and flexibility of your shoulder. These exercises can also help to relieve pain, numbness, and tingling. Exercise A: Pendulum  1. Stand near a wall or a surface that you can hold onto for balance. 2. Bend at the waist and let your left / right arm hang straight down. Use your other arm to keep  your balance. 3. Relax your arm and shoulder muscles, and move your hips and your trunk so your left / right arm swings freely. Your arm should swing because of the motion of your body, not because you are using your arm or shoulder muscles. 4. Keep moving so your arm swings in the following directions, as told by your health care provider: ? Side to side. ? Forward and backward. ? In clockwise and counterclockwise circles. 5. Slowly return to the starting position. Repeat 2 times. Complete this exercise 3 times per week. Exercise B: Abduction, standing 1. Stand and hold a broomstick, a cane, or a similar object. Place your hands a little more than shoulder-width apart on the object. Your left / right hand should be palm-up, and your other hand should be palm-down. 2. While keeping your elbow straight and your shoulder muscles relaxed, push the stick across your body toward your left / right side. Raise your left / right arm to the side of your body and then over your head until you feel a stretch in your shoulder. ? Stop when you reach the angle that is recommended by your health care provider. ? Avoid shrugging your shoulder while you raise your arm. Keep your shoulder blade tucked down toward the middle of your spine. 3. Hold for 10 seconds. 4. Slowly return to the starting position. Repeat 2 times. Complete this exercise 3 times per week. Exercise C: Wand flexion, supine  1. Lie on your back. You may bend your knees for comfort. 2. Hold a broomstick, a cane, or a similar object so that your hands are about  shoulder-width apart on the object. Your palms should face toward your feet. 3. Raise your left / right arm in front of your face, then behind your head (toward the floor). Use your other hand to help you do this. Stop when you feel a gentle stretch in your shoulder, or when you reach the angle that is recommended by your health care provider. 4. Hold for 3 seconds. 5. Use the broomstick  and your other arm to help you return your left / right arm to the starting position. Repeat 2 times. Complete this exercise 3 times per week. Exercise D: Wand shoulder external rotation 1. Stand and hold a broomstick, a cane, or a similar object so your handsare about shoulder-width apart on the object. 2. Start with your arms hanging down, then bend both elbows to an "L" shape (90 degrees). 3. Keep your left / right elbow at your side. Use your other hand to push the stick so your left / right forearm moves away from your body, out to your side. ? Keep your left / right elbow bent to 90 degrees and keep it against your side. ? Stop when you feel a gentle stretch in your shoulder, or when you reach the angle recommended by your health care provider. 4. Hold for 10 seconds. 5. Use the stick to help you return your left / right arm to the starting position. Repeat 2 times. Complete this exercise 3 times per week. Strengthening exercises These exercises build strength and endurance in your shoulder. Endurance is the ability to use your muscles for a long time, even after your muscles get tired. Exercise E: Scapular protraction, standing 1. Stand so you are facing a wall. Place your feet about one arm-length away from the wall. 2. Place your hands on the wall and straighten your elbows. 3. Keep your hands on the wall as you push your upper back away from the wall. You should feel your shoulder blades sliding forward.Keep your elbows and your head still. ? If you are not sure that you are doing this exercise correctly, ask your health care provider for more instructions. 4. Hold for 3 seconds. 5. Slowly return to the starting position. Let your muscles relax completely before you repeat this exercise. Repeat 2 times. Complete this exercise 3 times per week. Exercise F: Shoulder blade squeezes  (scapular retraction) 1. Sit with good posture in a stable chair. Do not let your back touch the back  of the chair. 2. Your arms should be at your sides with your elbows bent. You may rest your forearms on a pillow if that is more comfortable. 3. Squeeze your shoulder blades together. Bring them down and back. ? Keep your shoulders level. ? Do not lift your shoulders up toward your ears. 4. Hold for 3 seconds. 5. Return to the starting position. Repeat 2 times. Complete this exercise 3 times per week. This information is not intended to replace advice given to you by your health care provider. Make sure you discuss any questions you have with your health care provider. Document Released: 08/24/2005 Document Revised: 06/04/2016 Document Reviewed: 05/12/2015 Elsevier Interactive Patient Education  [employer].

## 2020-12-30 NOTE — Progress Notes (Signed)
Chief Complaint  Patient presents with  . Follow-up    Medication    Subjective Cesar [name] presents for f/u anxiety/depression.  Pt is currently being treated with Hydroxyzine 50 mg TID prn.  Reports doing poorly since treatment. No thoughts of harming self or others. No self-medication with alcohol, prescription drugs or illicit drugs. Pt is following with a counselor/psychologist.  Patient reports bilateral lung sharp chest pain over the past couple months.  It is worse in the morning when he first wakes up.  No specific injury or change in activity.  He is not having any wheezing, cough, shortness of breath, exertional symptoms, and notices no association with meals.  He has not tried anything at home so far.  Past Medical History:  Diagnosis Date  . Allergy   . Angina at rest [facility])   . Arthritis   . Back pain, lumbosacral   . Back pain, thoracic   . Depression   . Eczema   . GERD (gastroesophageal reflux disease)   . Prostatitis   . RSD (reflex sympathetic dystrophy)    on the right side  . Vertigo    Allergies as of 12/30/2020      Reactions   Formaldehyde Itching, Rash   SOLN   Latex Itching   Sulfa Antibiotics Itching, Nausea And Vomiting, Rash   15 POWD SOLN   Butorphanol Other (See Comments)   Drowsiness Drowsy Drowsiness   Celecoxib Rash   Flushing Flushing   Hydrocodone-acetaminophen Other (See Comments), Nausea And Vomiting   Metformin Other (See Comments)   Unknown   Oxycodone-acetaminophen Nausea And Vomiting   Povidone Iodine Rash   Metformin Hcl Other (See Comments)   *blurred vision, 11/06/2009   Simvastatin Other (See Comments)   Myalgias   Tamsulosin Other (See Comments)   Dyspnea   Nifedipine Nausea Only   Nitroglycerin Rash   Nitroglycerin patch developed rash and blisters from adhesive and latex   Povidone-iodine Rash   Quaternium-15 Rash   Rofecoxib Nausea Only   Flushing   Tape Itching, Rash      Medication List        Accurate as of December 30, 2020  2:15 PM. If you have any questions, ask your nurse or doctor.        acetaminophen 500 MG tablet Commonly known as: TYLENOL Take 500 mg by mouth every 6 (six) hours as needed.   ALPRAZolam 0.5 MG tablet Commonly known as: XANAX Take 1 tablet (0.5 mg total) by mouth 3 (three) times daily as needed for anxiety. Started by: Sharlene Dory, DO   aspirin 81 MG chewable tablet Chew by mouth daily.   Calcium-Vitamin D 600-200 MG-UNIT tablet Take by mouth.   hydrOXYzine 25 MG tablet Commonly known as: ATARAX/VISTARIL Take 0.5-1 tablets (12.5-25 mg total) by mouth every 8 (eight) hours as needed for anxiety.   metoprolol tartrate 25 MG tablet Commonly known as: LOPRESSOR Take 1/2 twice daily   midodrine 5 MG tablet Commonly known as: PROAMATINE Take 1 tablet (5 mg total) by mouth 2 (two) times daily with a meal.   nitroGLYCERIN 0.4 MG SL tablet Commonly known as: NITROSTAT Place 0.4 mg under the tongue.   nortriptyline 10 MG capsule Commonly known as: Pamelor Take 2 capsules (20 mg total) by mouth at bedtime.   omeprazole 20 MG capsule Commonly known as: PRILOSEC Take 1 capsule (20 mg total) by mouth daily.   Pfizer-BioNTech COVID-19 Vacc 30 MCG/0.3ML injection Generic drug: COVID-19 mRNA  vaccine [employer]) INJECT AS DIRECTED   polyethylene glycol 17 g packet Commonly known as: MIRALAX / GLYCOLAX 17 g by Does not apply route.   triamcinolone cream 0.1 % Commonly known as: KENALOG Apply to affected areas twice daily as needed       Exam BP 120/78 (BP Location: Left Arm, Patient Position: Sitting, Cuff Size: Normal)   Pulse 85   Temp (!) 97.5 F (36.4 C) (Oral)   Ht 6' (1.829 m)   Wt 193 lb (87.5 kg)   SpO2 94%   BMI 26.18 kg/m  General:  well developed, well nourished, in no apparent distress Heart: RRR, no bruits, no lower extremity edema MSK: There is some tenderness to palpation over the sternum and lower rib cage  bilaterally; this is the same distribution that he describes his pain. Lungs:  CTAB. No respiratory distress Psych: well oriented with normal range of affect and age-appropriate judgement/insight, alert and oriented x4.  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  Stop hydroxyzine, restart Xanax. CSC today.  Psychiatric and psychology resources provided in his AVS. 3.  His chest pain is nonexertional and certainly atypical in nature.  He is in process of setting up with a new cardiologist.  I did give him some stretches and exercises for the chest wall pain. F/u as originally scheduled in September. The patient voiced understanding and agreement to the plan.  [name] [place], DO 12/30/20 2:15 PM

## 2021-01-17 ENCOUNTER — Other Ambulatory Visit: Payer: Self-pay | Admitting: Family Medicine

## 2021-01-20 ENCOUNTER — Ambulatory Visit: Payer: Medicare Other | Admitting: Family Medicine

## 2021-02-05 ENCOUNTER — Encounter: Payer: Self-pay | Admitting: Family Medicine

## 2021-02-05 ENCOUNTER — Ambulatory Visit (INDEPENDENT_AMBULATORY_CARE_PROVIDER_SITE_OTHER): Payer: Medicare Other | Admitting: Family Medicine

## 2021-02-05 ENCOUNTER — Other Ambulatory Visit: Payer: Self-pay

## 2021-02-05 VITALS — BP 108/76 | HR 82 | Temp 97.9°F | Ht 72.0 in | Wt 195.1 lb

## 2021-02-05 DIAGNOSIS — F4310 Post-traumatic stress disorder, unspecified: Secondary | ICD-10-CM | POA: Diagnosis not present

## 2021-02-05 MED ORDER — CITALOPRAM HYDROBROMIDE 20 MG PO TABS: 20.0000 mg | ORAL_TABLET | Freq: Every day | ORAL | 3 refills | Status: DC

## 2021-02-05 MED ORDER — NITROGLYCERIN 0.4 MG SL SUBL: 0.4000 mg | SUBLINGUAL_TABLET | SUBLINGUAL | 2 refills | Status: AC | PRN

## 2021-02-05 NOTE — Progress Notes (Signed)
Chief Complaint  Patient presents with  . Anxiety    Subjective Cesar [name] presents for f/u anxiety.  Pt is currently being treated with Xanax 0.5 mg TID prn, Pamelor 20 mg qhs. Vistaril prn does help.  Reports still having anxiety since treatment. No thoughts of harming self or others. No self-medication with alcohol, prescription drugs or illicit drugs. Pt is not following with a counselor/psychologist.  Past Medical History:  Diagnosis Date  . Allergy   . Angina at rest [facility])   . Arthritis   . Back pain, lumbosacral   . Back pain, thoracic   . Depression   . Eczema   . GERD (gastroesophageal reflux disease)   . Prostatitis   . RSD (reflex sympathetic dystrophy)    on the right side  . Vertigo    Allergies as of 02/05/2021      Reactions   Formaldehyde Itching, Rash   SOLN   Latex Itching   Sulfa Antibiotics Itching, Nausea And Vomiting, Rash   15 POWD SOLN   Butorphanol Other (See Comments)   Drowsiness Drowsy Drowsiness   Celecoxib Rash   Flushing Flushing   Hydrocodone-acetaminophen Other (See Comments), Nausea And Vomiting   Metformin Other (See Comments)   Unknown   Oxycodone-acetaminophen Nausea And Vomiting   Povidone Iodine Rash   Metformin Hcl Other (See Comments)   *blurred vision, 11/06/2009   Simvastatin Other (See Comments)   Myalgias   Tamsulosin Other (See Comments)   Dyspnea   Nifedipine Nausea Only   Nitroglycerin Rash   Nitroglycerin patch developed rash and blisters from adhesive and latex   Povidone-iodine Rash   Quaternium-15 Rash   Rofecoxib Nausea Only   Flushing   Tape Itching, Rash      Medication List       Accurate as of February 05, 2021  2:13 PM. If you have any questions, ask your nurse or doctor.        STOP taking these medications   nortriptyline 10 MG capsule Commonly known as: PAMELOR Stopped by: Sharlene Dory, DO     TAKE these medications   acetaminophen 500 MG tablet Commonly known as:  TYLENOL Take 500 mg by mouth every 6 (six) hours as needed.   ALPRAZolam 0.5 MG tablet Commonly known as: XANAX Take 1 tablet (0.5 mg total) by mouth 3 (three) times daily as needed for anxiety.   aspirin 81 MG chewable tablet Chew by mouth daily.   Calcium-Vitamin D 600-200 MG-UNIT tablet Take by mouth.   citalopram 20 MG tablet Commonly known as: CELEXA Take 1 tablet (20 mg total) by mouth daily. Started by: Sharlene Dory, DO   hydrOXYzine 25 MG tablet Commonly known as: ATARAX/VISTARIL Take 0.5-1 tablets (12.5-25 mg total) by mouth every 8 (eight) hours as needed for anxiety.   metoprolol tartrate 25 MG tablet Commonly known as: LOPRESSOR Take 1/2 twice daily   midodrine 5 MG tablet Commonly known as: PROAMATINE Take 1 tablet (5 mg total) by mouth 2 (two) times daily with a meal.   nitroGLYCERIN 0.4 MG SL tablet Commonly known as: NITROSTAT Place 1 tablet (0.4 mg total) under the tongue every 5 (five) minutes as needed for chest pain. What changed:   when to take this  reasons to take this Changed by: Sharlene Dory, DO   omeprazole 20 MG capsule Commonly known as: PRILOSEC Take 1 capsule (20 mg total) by mouth daily.   Pfizer-BioNTech COVID-19 Vacc 30 MCG/0.3ML injection Generic  drug: COVID-19 mRNA vaccine (Pfizer) INJECT AS DIRECTED   polyethylene glycol 17 g packet Commonly known as: MIRALAX / GLYCOLAX 17 g by Does not apply route.   triamcinolone cream 0.1 % Commonly known as: KENALOG Apply to affected areas twice daily as needed       Exam BP 108/76 (BP Location: Left Arm, Patient Position: Sitting, Cuff Size: Normal)   Pulse 82   Temp 97.9 F (36.6 C) (Oral)   Ht 6' (1.829 m)   Wt 195 lb 2 oz (88.5 kg)   SpO2 99%   BMI 26.46 kg/m  General:  well developed, well nourished, in no apparent distress Lungs:  No respiratory distress Psych: well oriented with normal range of affect and age-appropriate judgement/insight, alert  and oriented x4.  Assessment and Plan  PTSD (post-traumatic stress disorder) - Plan: citalopram (CELEXA) 20 MG tablet  Chronic, unstable. Cont Xanax 0.5 mg tid, Vistaril prn. Cont w counseling. Cont w physical activity. WOuld consider Cymbalta or Risperdal + psych referral if no better next mo.  F/u in 1 mo. The patient voiced understanding and agreement to the plan.  Cesar Roche [place], DO 02/05/21 2:13 PM

## 2021-02-05 NOTE — Patient Instructions (Signed)
Continue with the counseling team.  Stay active.  Let me know if there are issues with the new medication.  Let us know if you need anything.

## 2021-03-07 ENCOUNTER — Other Ambulatory Visit: Payer: Self-pay

## 2021-03-07 ENCOUNTER — Ambulatory Visit (INDEPENDENT_AMBULATORY_CARE_PROVIDER_SITE_OTHER): Payer: Medicare Other | Admitting: Family Medicine

## 2021-03-07 ENCOUNTER — Encounter: Payer: Self-pay | Admitting: Family Medicine

## 2021-03-07 VITALS — BP 108/72 | HR 77 | Temp 98.0°F | Ht 72.0 in | Wt 199.5 lb

## 2021-03-07 DIAGNOSIS — I951 Orthostatic hypotension: Secondary | ICD-10-CM

## 2021-03-07 DIAGNOSIS — F4310 Post-traumatic stress disorder, unspecified: Secondary | ICD-10-CM | POA: Diagnosis not present

## 2021-03-07 MED ORDER — VENLAFAXINE HCL ER 37.5 MG PO CP24: 37.5000 mg | ORAL_CAPSULE | Freq: Every day | ORAL | 2 refills | Status: DC

## 2021-03-07 MED ORDER — MIDODRINE HCL 5 MG PO TABS
5.0000 mg | ORAL_TABLET | Freq: Two times a day (BID) | ORAL | 2 refills | Status: DC
Start: 2021-03-07 — End: 2022-02-26

## 2021-03-07 MED ORDER — OMEPRAZOLE 20 MG PO CPDR: 20.0000 mg | DELAYED_RELEASE_CAPSULE | Freq: Every day | ORAL | 2 refills | Status: DC

## 2021-03-07 MED ORDER — DULOXETINE HCL 30 MG PO CPEP: 30.0000 mg | ORAL_CAPSULE | Freq: Every day | ORAL | 3 refills | Status: DC

## 2021-03-07 NOTE — Patient Instructions (Addendum)
Stop taking the omeprazole and see how you do. If you need to go back on it, let me know.   Continue with the counseling team.   Let us know if you need anything.

## 2021-03-07 NOTE — Progress Notes (Signed)
Chief Complaint  Patient presents with   Follow-up    Subjective Cesar [name] presents for f/u PTSD.  Pt is currently being treated with Citalopram.  Reports sob and anxiety since treatment. No thoughts of harming self or others. No self-medication with alcohol, prescription drugs or illicit drugs. Pt is following with a counselor/psychologist.  Past Medical History:  Diagnosis Date   Allergy    Angina at rest [facility])    Arthritis    Back pain, lumbosacral    Back pain, thoracic    Depression    Eczema    GERD (gastroesophageal reflux disease)    Prostatitis    RSD (reflex sympathetic dystrophy)    on the right side   Vertigo    Allergies as of 03/07/2021       Reactions   Formaldehyde Itching, Rash   SOLN   Latex Itching   Sulfa Antibiotics Itching, Nausea And Vomiting, Rash   15 POWD SOLN   Butorphanol Other (See Comments)   Drowsiness Drowsy Drowsiness   Celecoxib Rash   Flushing Flushing   Hydrocodone-acetaminophen Other (See Comments), Nausea And Vomiting   Metformin Other (See Comments)   Unknown   Oxycodone-acetaminophen Nausea And Vomiting   Povidone Iodine Rash   Metformin Hcl Other (See Comments)   *blurred vision, 11/06/2009   Simvastatin Other (See Comments)   Myalgias   Tamsulosin Other (See Comments)   Dyspnea   Nifedipine Nausea Only   Nitroglycerin Rash   Nitroglycerin patch developed rash and blisters from adhesive and latex   Povidone-iodine Rash   Quaternium-15 Rash   Rofecoxib Nausea Only   Flushing   Tape Itching, Rash        Medication List        Accurate as of March 07, 2021  1:27 PM. If you have any questions, ask your nurse or doctor.          STOP taking these medications    citalopram 20 MG tablet Commonly known as: CELEXA Stopped by: Sharlene Dory, DO       TAKE these medications    acetaminophen 500 MG tablet Commonly known as: TYLENOL Take 500 mg by mouth every 6 (six) hours as needed.    ALPRAZolam 0.5 MG tablet Commonly known as: XANAX Take 1 tablet (0.5 mg total) by mouth 3 (three) times daily as needed for anxiety.   aspirin 81 MG chewable tablet Chew by mouth daily.   Calcium-Vitamin D 600-200 MG-UNIT tablet Take by mouth.   DULoxetine 30 MG capsule Commonly known as: Cymbalta Take 1 capsule (30 mg total) by mouth daily. Started by: Sharlene Dory, DO   hydrOXYzine 25 MG tablet Commonly known as: ATARAX/VISTARIL Take 0.5-1 tablets (12.5-25 mg total) by mouth every 8 (eight) hours as needed for anxiety.   metoprolol tartrate 25 MG tablet Commonly known as: LOPRESSOR Take 1/2 twice daily   midodrine 5 MG tablet Commonly known as: PROAMATINE Take 1 tablet (5 mg total) by mouth 2 (two) times daily with a meal.   nitroGLYCERIN 0.4 MG SL tablet Commonly known as: NITROSTAT Place 1 tablet (0.4 mg total) under the tongue every 5 (five) minutes as needed for chest pain.   omeprazole 20 MG capsule Commonly known as: PRILOSEC Take 1 capsule (20 mg total) by mouth daily.   Pfizer-BioNTech COVID-19 Vacc 30 MCG/0.3ML injection Generic drug: COVID-19 mRNA vaccine (Pfizer) INJECT AS DIRECTED   polyethylene glycol 17 g packet Commonly known as: MIRALAX / GLYCOLAX 17  g by Does not apply route.   triamcinolone cream 0.1 % Commonly known as: KENALOG Apply to affected areas twice daily as needed        Exam BP 108/72   Pulse 77   Temp 98 F (36.7 C) (Oral)   Ht 6' (1.829 m)   Wt 199 lb 8 oz (90.5 kg)   SpO2 98%   BMI 27.06 kg/m  General:  well developed, well nourished, in no apparent distress Lungs:  No respiratory distress Psych: well oriented with normal range of affect and age-appropriate judgement/insight, alert and oriented x4.  Assessment and Plan  PTSD (post-traumatic stress disorder) - Plan: DULoxetine (CYMBALTA) 30 MG capsule  Orthostatic hypotension - Plan: midodrine (PROAMATINE) 5 MG tablet  Chronic, uncontrolled. Start  Cymbalta 30 mg/d. Cont w counseling.  F/u in 1 mo. The patient voiced understanding and agreement to the plan.  [name] [place], DO 03/07/21 1:27 PM

## 2021-04-07 ENCOUNTER — Encounter: Payer: Self-pay | Admitting: Family Medicine

## 2021-04-07 ENCOUNTER — Ambulatory Visit: Payer: Medicare Other | Attending: Internal Medicine

## 2021-04-07 ENCOUNTER — Other Ambulatory Visit: Payer: Self-pay

## 2021-04-07 ENCOUNTER — Ambulatory Visit (INDEPENDENT_AMBULATORY_CARE_PROVIDER_SITE_OTHER): Payer: Medicare Other | Admitting: Family Medicine

## 2021-04-07 DIAGNOSIS — F4310 Post-traumatic stress disorder, unspecified: Secondary | ICD-10-CM | POA: Diagnosis not present

## 2021-04-07 DIAGNOSIS — F411 Generalized anxiety disorder: Secondary | ICD-10-CM | POA: Diagnosis not present

## 2021-04-07 DIAGNOSIS — Z23 Encounter for immunization: Secondary | ICD-10-CM

## 2021-04-07 MED ORDER — HYDROXYZINE HCL 25 MG PO TABS: 25.0000 mg | ORAL_TABLET | Freq: Three times a day (TID) | ORAL | 1 refills | Status: DC | PRN

## 2021-04-07 MED ORDER — QUETIAPINE FUMARATE 25 MG PO TABS: 25.0000 mg | ORAL_TABLET | Freq: Every day | ORAL | 2 refills | Status: DC

## 2021-04-07 NOTE — Progress Notes (Signed)
Chief Complaint  Patient presents with   Follow-up    Subjective Cesar [name] presents for f/u anxiety/depression.  Pt is currently being treated with Cymbalta 30 mg/d.  Reports stopping since treatment as it made him jittery and gave him insomnia. Prn Xanax and hydroxyzine helps temporarily.  No thoughts of harming self or others. No self-medication with alcohol, prescription drugs or illicit drugs. Pt is following with a counselor/psychologist. Has now failed Cymbalta, Abilify, Prozac, Celexa, Lexapro, nortriptyline.   Past Medical History:  Diagnosis Date   Allergy    Angina at rest [facility])    Arthritis    Back pain, lumbosacral    Back pain, thoracic    Depression    Eczema    GERD (gastroesophageal reflux disease)    Prostatitis    RSD (reflex sympathetic dystrophy)    on the right side   Vertigo    Allergies as of 04/07/2021       Reactions   Formaldehyde Itching, Rash   SOLN   Latex Itching   Sulfa Antibiotics Itching, Nausea And Vomiting, Rash   15 POWD SOLN   Butorphanol Other (See Comments)   Drowsiness Drowsy Drowsiness   Celecoxib Rash   Flushing Flushing   Hydrocodone-acetaminophen Other (See Comments), Nausea And Vomiting   Metformin Other (See Comments)   Unknown   Oxycodone-acetaminophen Nausea And Vomiting   Povidone Iodine Rash   Metformin Hcl Other (See Comments)   *blurred vision, 11/06/2009   Simvastatin Other (See Comments)   Myalgias   Tamsulosin Other (See Comments)   Dyspnea   Nifedipine Nausea Only   Nitroglycerin Rash   Nitroglycerin patch developed rash and blisters from adhesive and latex   Povidone-iodine Rash   Quaternium-15 Rash   Rofecoxib Nausea Only   Flushing   Tape Itching, Rash        Medication List        Accurate as of April 07, 2021  2:09 PM. If you have any questions, ask your nurse or doctor.          STOP taking these medications    DULoxetine 30 MG capsule Commonly known as: Cymbalta Stopped  by: Sharlene Dory, DO       TAKE these medications    acetaminophen 500 MG tablet Commonly known as: TYLENOL Take 500 mg by mouth every 6 (six) hours as needed.   ALPRAZolam 0.5 MG tablet Commonly known as: XANAX Take 1 tablet (0.5 mg total) by mouth 3 (three) times daily as needed for anxiety.   aspirin 81 MG chewable tablet Chew by mouth daily.   Calcium-Vitamin D 600-200 MG-UNIT tablet Take by mouth.   hydrOXYzine 25 MG tablet Commonly known as: ATARAX/VISTARIL Take 1-2 tablets (25-50 mg total) by mouth every 8 (eight) hours as needed for anxiety. What changed: how much to take Changed by: Sharlene Dory, DO   metoprolol tartrate 25 MG tablet Commonly known as: LOPRESSOR Take 1/2 twice daily   midodrine 5 MG tablet Commonly known as: PROAMATINE Take 1 tablet (5 mg total) by mouth 2 (two) times daily with a meal.   nitroGLYCERIN 0.4 MG SL tablet Commonly known as: NITROSTAT Place 1 tablet (0.4 mg total) under the tongue every 5 (five) minutes as needed for chest pain.   omeprazole 20 MG capsule Commonly known as: PRILOSEC Take 1 capsule (20 mg total) by mouth daily.   Pfizer-BioNTech COVID-19 Vacc 30 MCG/0.3ML injection Generic drug: COVID-19 mRNA vaccine Proofreader) INJECT AS DIRECTED  polyethylene glycol 17 g packet Commonly known as: MIRALAX / GLYCOLAX 17 g by Does not apply route.   QUEtiapine 25 MG tablet Commonly known as: SEROquel Take 1 tablet (25 mg total) by mouth at bedtime. Started by: Sharlene Dory, DO   triamcinolone cream 0.1 % Commonly known as: KENALOG Apply to affected areas twice daily as needed        Exam BP 124/82   Pulse 66   Temp 98.1 F (36.7 C) (Oral)   Ht 6' (1.829 m)   Wt 200 lb (90.7 kg)   SpO2 99%   BMI 27.12 kg/m  General:  well developed, well nourished, in no apparent distress Lungs:  No respiratory distress Psych: well oriented with normal range of affect and age-appropriate  judgement/insight, alert and oriented x4.  Assessment and Plan  PTSD (post-traumatic stress disorder) - Plan: hydrOXYzine (ATARAX/VISTARIL) 25 MG tablet  GAD (generalized anxiety disorder) - Plan: hydrOXYzine (ATARAX/VISTARIL) 25 MG tablet  Chronic, uncontrolled. [facility] info provided. Has failed Prozac, Lexapro, Celexa, Cymbalta, Abilify, Nortriptyline. Cont Xanax, Hydroxyzine prn. Start Seroquel qhs. Cont w therapist, ask about psychiatry.  F/u in 1 mo. The patient voiced understanding and agreement to the plan.  [name] [place], DO 04/07/21 2:09 PM

## 2021-04-07 NOTE — Patient Instructions (Addendum)
OK to take 1-2 tabs of the hydroxyzine as needed.  We are adding a new medicine.  Ask your therapist about setting up with a psychiatrist as we are not having the success I had hoped for.  Please consider counseling. Contact [phone] to schedule an appointment or inquire about cost/insurance coverage.  Let us know if you need anything.

## 2021-04-07 NOTE — Progress Notes (Signed)
   Covid-19 Vaccination Clinic  Name:  Cesar [name]    MRN: [id number] DOB: 23-Jul-1946  04/07/2021  [name] was observed post Covid-19 immunization for 15 minutes without incident. He was provided with Vaccine Information Sheet and instruction to access the V-Safe system.   [name] was instructed to call 911 with any severe reactions post vaccine: Difficulty breathing  Swelling of face and throat  A fast heartbeat  A bad rash all over body  Dizziness and weakness   Immunizations Administered     Name Date Dose VIS Date Route   PFIZER Comrnaty(Gray TOP) Covid-19 Vaccine 04/07/2021  2:31 PM 0.3 mL 08/15/2020 Intramuscular   Manufacturer: [employer], [employer]   Lot: [id number]   NDC: [phone]

## 2021-04-10 ENCOUNTER — Other Ambulatory Visit (HOSPITAL_BASED_OUTPATIENT_CLINIC_OR_DEPARTMENT_OTHER): Payer: Self-pay

## 2021-04-10 MED ORDER — COVID-19 MRNA VAC-TRIS(PFIZER) 30 MCG/0.3ML IM SUSP
INTRAMUSCULAR | 0 refills | Status: DC
  Filled 2021-04-10: qty 0.3, 1d supply, fill #0

## 2021-05-13 ENCOUNTER — Ambulatory Visit (INDEPENDENT_AMBULATORY_CARE_PROVIDER_SITE_OTHER): Payer: Medicare Other | Admitting: Family Medicine

## 2021-05-13 ENCOUNTER — Encounter: Payer: Self-pay | Admitting: Family Medicine

## 2021-05-13 ENCOUNTER — Other Ambulatory Visit: Payer: Self-pay

## 2021-05-13 VITALS — BP 110/68 | HR 64 | Temp 98.1°F | Ht 71.0 in | Wt 195.4 lb

## 2021-05-13 DIAGNOSIS — R319 Hematuria, unspecified: Secondary | ICD-10-CM | POA: Diagnosis not present

## 2021-05-13 DIAGNOSIS — F4310 Post-traumatic stress disorder, unspecified: Secondary | ICD-10-CM

## 2021-05-13 DIAGNOSIS — F411 Generalized anxiety disorder: Secondary | ICD-10-CM | POA: Diagnosis not present

## 2021-05-13 LAB — URINALYSIS, MICROSCOPIC ONLY
RBC / HPF: NONE SEEN (ref 0–?)
WBC, UA: NONE SEEN (ref 0–?)

## 2021-05-13 MED ORDER — NORTRIPTYLINE HCL 25 MG PO CAPS: 25.0000 mg | ORAL_CAPSULE | Freq: Every day | ORAL | 2 refills | Status: DC

## 2021-05-13 NOTE — Progress Notes (Signed)
Chief Complaint  Patient presents with   Follow-up   Hematuria    Subjective Cesar [name] presents for f/u anxiety.  Pt is currently being treated with Seroquel 25 mg qhs, hydroxyzine and alprazolam prn.  Reports slightly since treatment. No thoughts of harming self or others. No self-medication with alcohol, prescription drugs or illicit drugs. Pt is following with a counselor/psychologist.  Past Medical History:  Diagnosis Date   Allergy    Angina at rest [facility])    Arthritis    Back pain, lumbosacral    Back pain, thoracic    Depression    Eczema    GERD (gastroesophageal reflux disease)    Prostatitis    RSD (reflex sympathetic dystrophy)    on the right side   Vertigo    Allergies as of 05/13/2021       Reactions   Formaldehyde Itching, Rash   SOLN   Latex Itching   Sulfa Antibiotics Itching, Nausea And Vomiting, Rash   15 POWD SOLN   Butorphanol Other (See Comments)   Drowsiness Drowsy Drowsiness   Celecoxib Rash   Flushing Flushing   Hydrocodone-acetaminophen Other (See Comments), Nausea And Vomiting   Metformin Other (See Comments)   Unknown   Oxycodone-acetaminophen Nausea And Vomiting   Povidone Iodine Rash   Metformin Hcl Other (See Comments)   *blurred vision, 11/06/2009   Simvastatin Other (See Comments)   Myalgias   Tamsulosin Other (See Comments)   Dyspnea   Nifedipine Nausea Only   Nitroglycerin Rash   Nitroglycerin patch developed rash and blisters from adhesive and latex   Povidone-iodine Rash   Quaternium-15 Rash   Rofecoxib Nausea Only   Flushing   Tape Itching, Rash        Medication List        Accurate as of May 13, 2021  1:21 PM. If you have any questions, ask your nurse or doctor.          STOP taking these medications    QUEtiapine 25 MG tablet Commonly known as: SEROquel Stopped by: Sharlene Dory, DO       TAKE these medications    acetaminophen 500 MG tablet Commonly known as:  TYLENOL Take 500 mg by mouth every 6 (six) hours as needed.   ALPRAZolam 0.5 MG tablet Commonly known as: XANAX Take 1 tablet (0.5 mg total) by mouth 3 (three) times daily as needed for anxiety.   aspirin 81 MG chewable tablet Chew by mouth daily.   Calcium-Vitamin D 600-200 MG-UNIT tablet Take by mouth.   hydrOXYzine 25 MG tablet Commonly known as: ATARAX/VISTARIL Take 1-2 tablets (25-50 mg total) by mouth every 8 (eight) hours as needed for anxiety.   metoprolol tartrate 25 MG tablet Commonly known as: LOPRESSOR Take 1/2 twice daily   midodrine 5 MG tablet Commonly known as: PROAMATINE Take 1 tablet (5 mg total) by mouth 2 (two) times daily with a meal.   nitroGLYCERIN 0.4 MG SL tablet Commonly known as: NITROSTAT Place 1 tablet (0.4 mg total) under the tongue every 5 (five) minutes as needed for chest pain.   nortriptyline 25 MG capsule Commonly known as: Pamelor Take 1 capsule (25 mg total) by mouth at bedtime. Started by: Sharlene Dory, DO   omeprazole 20 MG capsule Commonly known as: PRILOSEC Take 1 capsule (20 mg total) by mouth daily.   Pfizer-BioNTech COVID-19 Vacc 30 MCG/0.3ML injection Generic drug: COVID-19 mRNA vaccine (Pfizer) INJECT AS DIRECTED   Pfizer-BioNT COVID-19 Vac-TriS  Susp injection Generic drug: COVID-19 mRNA Vac-TriS (Pfizer) Inject into the muscle.   polyethylene glycol 17 g packet Commonly known as: MIRALAX / GLYCOLAX 17 g by Does not apply route.   triamcinolone cream 0.1 % Commonly known as: KENALOG Apply to affected areas twice daily as needed        Exam BP 110/68   Pulse 64   Temp 98.1 F (36.7 C) (Oral)   Ht 5\' 11"  (1.803 m)   Wt 195 lb 6 oz (88.6 kg)   SpO2 97%   BMI 27.25 kg/m  General:  well developed, well nourished, in no apparent distress Lungs:  No respiratory distress Psych: well oriented with normal range of affect and age-appropriate judgement/insight, alert and oriented x4.  Assessment and  Plan  PTSD (post-traumatic stress disorder)  GAD (generalized anxiety disorder)  Hematuria, unspecified type  1/2. Chronic, uncontrolled. Stop Seroquel. Go back on nortriptyline, but we will increase the dosage from 20 mg/d he was on before to 25 mg/d. Cont w counseling. He is getting set up w psychiatry thru .  3. Ck urine micro and cx.  F/u in 6 mo for med ck. The patient voiced understanding and agreement to the plan.  [place], DO 05/13/21 1:21 PM

## 2021-05-13 NOTE — Patient Instructions (Signed)
Let's hold off on a 1 month follow up since you are going to see a psychiatrist.   Stay active.  Let us know if you need anything.

## 2021-05-13 NOTE — Addendum Note (Signed)
Addended by: Scharlene Gloss B on: 05/13/2021 01:26 PM   Modules accepted: Orders

## 2021-05-14 LAB — URINE CULTURE
MICRO NUMBER:: 12335296
Result:: NO GROWTH
SPECIMEN QUALITY:: ADEQUATE

## 2021-05-23 ENCOUNTER — Ambulatory Visit: Payer: Medicare Other | Admitting: Family Medicine

## 2021-06-11 ENCOUNTER — Ambulatory Visit: Payer: Medicare Other | Attending: Internal Medicine

## 2021-06-11 ENCOUNTER — Other Ambulatory Visit (HOSPITAL_BASED_OUTPATIENT_CLINIC_OR_DEPARTMENT_OTHER): Payer: Self-pay

## 2021-06-11 DIAGNOSIS — Z23 Encounter for immunization: Secondary | ICD-10-CM

## 2021-06-11 MED ORDER — INFLUENZA VAC A&B SA ADJ QUAD 0.5 ML IM PRSY
PREFILLED_SYRINGE | INTRAMUSCULAR | 0 refills | Status: DC
  Filled 2021-06-11: qty 0.5, 1d supply, fill #0

## 2021-06-11 NOTE — Progress Notes (Signed)
   Covid-19 Vaccination Clinic  Name:  Cesar [name]    MRN: [id number] DOB: [birth year]  06/11/2021  Mr. [name] was observed post Covid-19 immunization for 15 minutes without incident. He was provided with Vaccine Information Sheet and instruction to access the V-Safe system.   Mr. [name] was instructed to call 911 with any severe reactions post vaccine: Difficulty breathing  Swelling of face and throat  A fast heartbeat  A bad rash all over body  Dizziness and weakness

## 2021-06-20 ENCOUNTER — Ambulatory Visit (INDEPENDENT_AMBULATORY_CARE_PROVIDER_SITE_OTHER): Payer: Medicare Other | Admitting: Family Medicine

## 2021-06-20 ENCOUNTER — Other Ambulatory Visit (HOSPITAL_BASED_OUTPATIENT_CLINIC_OR_DEPARTMENT_OTHER): Payer: Self-pay

## 2021-06-20 ENCOUNTER — Other Ambulatory Visit: Payer: Self-pay

## 2021-06-20 ENCOUNTER — Encounter: Payer: Self-pay | Admitting: Family Medicine

## 2021-06-20 VITALS — BP 124/84 | HR 63 | Temp 97.5°F | Ht 71.0 in | Wt 196.4 lb

## 2021-06-20 DIAGNOSIS — F4310 Post-traumatic stress disorder, unspecified: Secondary | ICD-10-CM

## 2021-06-20 DIAGNOSIS — R0789 Other chest pain: Secondary | ICD-10-CM

## 2021-06-20 MED ORDER — ALPRAZOLAM 0.5 MG PO TABS: 0.5000 mg | ORAL_TABLET | Freq: Three times a day (TID) | ORAL | 1 refills | Status: DC | PRN

## 2021-06-20 MED ORDER — COVID-19MRNA BIVAL VACC PFIZER 30 MCG/0.3ML IM SUSP
INTRAMUSCULAR | 0 refills | Status: DC
  Filled 2021-06-20: qty 0.3, 1d supply, fill #0

## 2021-06-20 NOTE — Progress Notes (Signed)
Chief Complaint  Patient presents with   Follow-up    medication    Cesar [name] is a 75 y.o. male here for evaluation of central chest pain sustained on 06/11/21.   Duration of issue: happened around 9 Quality: sharp Palliation: Nitro Provocation: Had the vaccine booster for covid and flu shot earlier in the day Severity: 7/10 Radiation: none Duration of chest pain: 1 day Associated symptoms: palpitations, slight sob Cardiac history: yes Family heart history: none Smoker? No  Past Medical History:  Diagnosis Date   Allergy    Angina at rest [facility])    Arthritis    Back pain, lumbosacral    Back pain, thoracic    Depression    Eczema    GERD (gastroesophageal reflux disease)    Prostatitis    RSD (reflex sympathetic dystrophy)    on the right side   Vertigo    Family History  Problem Relation Age of Onset   Allergic rhinitis Neg Hx    Angioedema Neg Hx    Asthma Neg Hx    Eczema Neg Hx    Immunodeficiency Neg Hx    Urticaria Neg Hx     BP 124/84   Pulse 63   Temp (!) 97.5 F (36.4 C) (Oral)   Ht 5\' 11"  (1.803 m)   Wt 196 lb 6 oz (89.1 kg)   SpO2 99%   BMI 27.39 kg/m  Gen: awake, alert, appears stated age Heart: RRR, no bruits, no LE edema Lungs: CTAB, no accessory muscle use MSK: chest pain is not reproducible to palptation Psych: Age appropriate judgment and insight, nml mood and affect  Atypical chest pain  PTSD (post-traumatic stress disorder) - Plan: ALPRAZolam (XANAX) 0.5 MG tablet  Likely AE of vaccine causing racing HR and leading to angina. No issues recently. Reassurance for now. Cont SL Nitro prn.  F/u prn. The patient voiced understanding and agreement to the plan.  [place], DO 06/20/21 11:19 AM

## 2021-06-20 NOTE — Patient Instructions (Signed)
I think this was more a vaccine adverse effect leading to an anginal episode.   Let me know if anything changes or new arises.   Let us know if you need anything.

## 2021-06-30 ENCOUNTER — Ambulatory Visit: Payer: Medicare Other | Admitting: Family Medicine

## 2021-07-22 ENCOUNTER — Encounter: Payer: Self-pay | Admitting: Family Medicine

## 2021-07-22 ENCOUNTER — Other Ambulatory Visit: Payer: Self-pay

## 2021-07-22 ENCOUNTER — Ambulatory Visit (INDEPENDENT_AMBULATORY_CARE_PROVIDER_SITE_OTHER): Payer: Medicare Other | Admitting: Family Medicine

## 2021-07-22 VITALS — BP 136/80 | HR 71 | Temp 97.7°F | Ht 72.0 in | Wt 195.2 lb

## 2021-07-22 DIAGNOSIS — F4310 Post-traumatic stress disorder, unspecified: Secondary | ICD-10-CM | POA: Diagnosis not present

## 2021-07-22 DIAGNOSIS — F411 Generalized anxiety disorder: Secondary | ICD-10-CM | POA: Diagnosis not present

## 2021-07-22 MED ORDER — BUSPIRONE HCL 7.5 MG PO TABS: 7.5000 mg | ORAL_TABLET | Freq: Two times a day (BID) | ORAL | 2 refills | Status: DC

## 2021-07-22 NOTE — Patient Instructions (Addendum)
[facility] [address] [place], [place] [address] [phone]  [facility] [address] [place], [place] [address] [phone]  [facility] [address] [place], [place] [address] [phone]  [facility] [address], [place], [place], [phone] [address], [place], [place], #[phone]  [facility] [address] [place], [place] [address] [phone]  [facility] [address] [place], [place] [phone]  [facility] [address] [place], [place] [phone]  [facility] [address] [place], [place]   [phone].   Please go online to complete a form to submit first.  [facility]  [address] [place], [place] [phone].     [facility] -  local practices located at: [address], [place], [place]. [phone]. [address] [place], [place], [place].  [phone]. [address], [place], [place].  [phone].  Contact one of these offices sooner than later as it can take 2-3 months to get a new patient appointment.   If you do not hear anything about your referral in the next 1-2 weeks, call our office and ask for an update.  Aim to do some physical exertion for 150 minutes per week. This is typically divided into 5 days per week, 30 minutes per day. The activity should be enough to get your heart rate up. Anything is better than nothing if you have time constraints.  Let us know if you need anything.

## 2021-07-22 NOTE — Progress Notes (Signed)
Chief Complaint  Patient presents with   Follow-up    Subjective Cesar [name] presents for f/u anxiety/PTSD.  Pt is currently being treated with Xanax TID prn, Pamelor 25 mg/d, hydroxyzine prn.  Reports doing poorly w anxiety since treatment. Having agoraphobia and OCD tendencies.  No thoughts of harming self or others. No self-medication with alcohol, prescription drugs or illicit drugs. Pt is not following with a counselor/psychologist.  Past Medical History:  Diagnosis Date   Allergy    Angina at rest [facility])    Arthritis    Back pain, lumbosacral    Back pain, thoracic    Depression    Eczema    GERD (gastroesophageal reflux disease)    Prostatitis    RSD (reflex sympathetic dystrophy)    on the right side   Vertigo    Allergies as of 07/22/2021       Reactions   Formaldehyde Itching, Rash   SOLN   Latex Itching   Sulfa Antibiotics Itching, Nausea And Vomiting, Rash   15 POWD SOLN   Butorphanol Other (See Comments)   Drowsiness Drowsy Drowsiness   Celecoxib Rash   Flushing Flushing   Hydrocodone-acetaminophen Other (See Comments), Nausea And Vomiting   Metformin Other (See Comments)   Unknown   Oxycodone-acetaminophen Nausea And Vomiting   Povidone Iodine Rash   Metformin Hcl Other (See Comments)   *blurred vision, 11/06/2009   Simvastatin Other (See Comments)   Myalgias   Tamsulosin Other (See Comments)   Dyspnea   Nifedipine Nausea Only   Nitroglycerin Rash   Nitroglycerin patch developed rash and blisters from adhesive and latex   Povidone-iodine Rash   Quaternium-15 Rash   Rofecoxib Nausea Only   Flushing   Tape Itching, Rash        Medication List        Accurate as of July 22, 2021  1:00 PM. If you have any questions, ask your nurse or doctor.          acetaminophen 500 MG tablet Commonly known as: TYLENOL Take 500 mg by mouth every 6 (six) hours as needed.   ALPRAZolam 0.5 MG tablet Commonly known as: XANAX Take 1  tablet (0.5 mg total) by mouth 3 (three) times daily as needed for anxiety.   aspirin 81 MG chewable tablet Chew by mouth daily.   busPIRone 7.5 MG tablet Commonly known as: BUSPAR Take 1 tablet (7.5 mg total) by mouth 2 (two) times daily. Started by: Sharlene Dory, DO   Calcium-Vitamin D 600-200 MG-UNIT tablet Take by mouth.   Fluad Quadrivalent 0.5 ML injection Generic drug: influenza vaccine adjuvanted Inject into the muscle.   hydrOXYzine 25 MG tablet Commonly known as: ATARAX/VISTARIL Take 1-2 tablets (25-50 mg total) by mouth every 8 (eight) hours as needed for anxiety.   metoprolol tartrate 25 MG tablet Commonly known as: LOPRESSOR Take 1/2 twice daily   midodrine 5 MG tablet Commonly known as: PROAMATINE Take 1 tablet (5 mg total) by mouth 2 (two) times daily with a meal.   nitroGLYCERIN 0.4 MG SL tablet Commonly known as: NITROSTAT Place 1 tablet (0.4 mg total) under the tongue every 5 (five) minutes as needed for chest pain.   nortriptyline 25 MG capsule Commonly known as: Pamelor Take 1 capsule (25 mg total) by mouth at bedtime.   omeprazole 20 MG capsule Commonly known as: PRILOSEC Take 1 capsule (20 mg total) by mouth daily.   Pfizer COVID-19 Vac Bivalent injection Generic drug: COVID-19  mRNA bivalent vaccine (Pfizer) Inject into the muscle.   Pfizer-BioNTech COVID-19 Vacc 30 MCG/0.3ML injection Generic drug: COVID-19 mRNA vaccine (Pfizer) INJECT AS DIRECTED   Pfizer-BioNT COVID-19 Vac-TriS Susp injection Generic drug: COVID-19 mRNA Vac-TriS (Pfizer) Inject into the muscle.   polyethylene glycol 17 g packet Commonly known as: MIRALAX / GLYCOLAX 17 g by Does not apply route.   triamcinolone cream 0.1 % Commonly known as: KENALOG Apply to affected areas twice daily as needed        Exam BP 136/80   Pulse 71   Temp 97.7 F (36.5 C) (Oral)   Ht 6' (1.829 m)   Wt 195 lb 4 oz (88.6 kg)   SpO2 98%   BMI 26.48 kg/m  General:   well developed, well nourished, in no apparent distress Lungs:  No respiratory distress Psych: well oriented with normal range of affect and age-appropriate judgement/insight, alert and oriented x4.  Assessment and Plan  PTSD (post-traumatic stress disorder) - Plan: Ambulatory referral to Psychiatry, busPIRone (BUSPAR) 7.5 MG tablet  GAD (generalized anxiety disorder) - Plan: Ambulatory referral to Psychiatry, busPIRone (BUSPAR) 7.5 MG tablet  Chronic, unstable. Cont Pamelor 25 mg/d, Hydroxyzine prn, Xanax prn. Add BuSpar 7.5 mg bid. Refer psychiatry. Self-referral contact list provided. Cont w counseling team thru [place] for now, unfortunately he could not get thru to a [place] psychiatrist at this time.  F/u in 1 mo. The patient voiced understanding and agreement to the plan.  [name] [place], DO 07/22/21 1:00 PM

## 2021-08-13 ENCOUNTER — Telehealth: Payer: Self-pay | Admitting: Family Medicine

## 2021-08-13 NOTE — Telephone Encounter (Signed)
Spoke to the patient to let him know [facility] has been contacted by our referral coordinators and they are currently pursuing them as referral is a month old now.  Will let him know as soon as we know something and that hopefully someone would call him soon. The patient verbalized understanding...had no other questions or concerns.

## 2021-08-13 NOTE — Telephone Encounter (Signed)
Pt called and stated he never received a call to schedule an appointment with a psychiatrist/therapist. He just wanted to get an update on that referral.

## 2021-08-22 ENCOUNTER — Encounter: Payer: Self-pay | Admitting: Family Medicine

## 2021-08-22 ENCOUNTER — Ambulatory Visit (INDEPENDENT_AMBULATORY_CARE_PROVIDER_SITE_OTHER): Payer: Medicare Other | Admitting: Family Medicine

## 2021-08-22 VITALS — BP 118/78 | HR 75 | Temp 98.6°F | Ht 72.0 in | Wt 193.4 lb

## 2021-08-22 DIAGNOSIS — F411 Generalized anxiety disorder: Secondary | ICD-10-CM | POA: Diagnosis not present

## 2021-08-22 DIAGNOSIS — F4310 Post-traumatic stress disorder, unspecified: Secondary | ICD-10-CM | POA: Diagnosis not present

## 2021-08-22 MED ORDER — PROPRANOLOL HCL ER 60 MG PO CP24: 60.0000 mg | ORAL_CAPSULE | Freq: Every day | ORAL | 2 refills | Status: DC

## 2021-08-22 NOTE — Progress Notes (Signed)
Chief Complaint  Patient presents with   Follow-up    Subjective Cesar [name] presents for f/u anxiety/PTSD.  Pt is currently being treated with Pamelor 25 mg/d, hydroxyzine/Xanax prn, BuSpar 7.5 mg bid recently added.  Felt flushed and lightheadedness on the BuSpar after 3 d. Kept taking, kept having and did not notice any improvement.  No thoughts of harming self or others. No self-medication with alcohol, prescription drugs or illicit drugs. Pt is following with a counselor/psychologist. Referred to psychiatry at last visit.   Past Medical History:  Diagnosis Date   Allergy    Angina at rest [facility])    Arthritis    Back pain, lumbosacral    Back pain, thoracic    Depression    Eczema    GERD (gastroesophageal reflux disease)    Prostatitis    RSD (reflex sympathetic dystrophy)    on the right side   Vertigo    Allergies as of 08/22/2021       Reactions   Formaldehyde Itching, Rash   SOLN   Latex Itching   Sulfa Antibiotics Itching, Nausea And Vomiting, Rash   15 POWD SOLN   Butorphanol Other (See Comments)   Drowsiness Drowsy Drowsiness   Celecoxib Rash   Flushing Flushing   Hydrocodone-acetaminophen Other (See Comments), Nausea And Vomiting   Metformin Other (See Comments)   Unknown   Oxycodone-acetaminophen Nausea And Vomiting   Povidone Iodine Rash   Metformin Hcl Other (See Comments)   *blurred vision, 11/06/2009   Simvastatin Other (See Comments)   Myalgias   Tamsulosin Other (See Comments)   Dyspnea   Nifedipine Nausea Only   Nitroglycerin Rash   Nitroglycerin patch developed rash and blisters from adhesive and latex   Povidone-iodine Rash   Quaternium-15 Rash   Rofecoxib Nausea Only   Flushing   Tape Itching, Rash        Medication List        Accurate as of August 22, 2021  1:44 PM. If you have any questions, ask your nurse or doctor.          STOP taking these medications    metoprolol tartrate 25 MG tablet Commonly known  as: LOPRESSOR Stopped by: Sharlene Dory, DO       TAKE these medications    acetaminophen 500 MG tablet Commonly known as: TYLENOL Take 500 mg by mouth every 6 (six) hours as needed.   ALPRAZolam 0.5 MG tablet Commonly known as: XANAX Take 1 tablet (0.5 mg total) by mouth 3 (three) times daily as needed for anxiety.   aspirin 81 MG chewable tablet Chew by mouth daily.   busPIRone 7.5 MG tablet Commonly known as: BUSPAR Take 1 tablet (7.5 mg total) by mouth 2 (two) times daily.   Calcium-Vitamin D 600-200 MG-UNIT tablet Take by mouth.   Fluad Quadrivalent 0.5 ML injection Generic drug: influenza vaccine adjuvanted Inject into the muscle.   hydrOXYzine 25 MG tablet Commonly known as: ATARAX Take 1-2 tablets (25-50 mg total) by mouth every 8 (eight) hours as needed for anxiety.   midodrine 5 MG tablet Commonly known as: PROAMATINE Take 1 tablet (5 mg total) by mouth 2 (two) times daily with a meal.   nitroGLYCERIN 0.4 MG SL tablet Commonly known as: NITROSTAT Place 1 tablet (0.4 mg total) under the tongue every 5 (five) minutes as needed for chest pain.   nortriptyline 25 MG capsule Commonly known as: Pamelor Take 1 capsule (25 mg total) by mouth at  bedtime.   omeprazole 20 MG capsule Commonly known as: PRILOSEC Take 1 capsule (20 mg total) by mouth daily.   Pfizer COVID-19 Vac Bivalent injection Generic drug: COVID-19 mRNA bivalent vaccine Proofreader) Inject into the muscle.   Pfizer-BioNT COVID-19 Vac-TriS Susp injection Generic drug: COVID-19 mRNA Vac-TriS (Pfizer) Inject into the muscle.   polyethylene glycol 17 g packet Commonly known as: MIRALAX / GLYCOLAX 17 g by Does not apply route.   propranolol ER 60 MG 24 hr capsule Commonly known as: Inderal LA Take 1 capsule (60 mg total) by mouth daily. Started by: Sharlene Dory, DO   triamcinolone cream 0.1 % Commonly known as: KENALOG Apply to affected areas twice daily as needed         Exam BP 118/78    Pulse 75    Temp 98.6 F (37 C) (Oral)    Ht 6' (1.829 m)    Wt 193 lb 6 oz (87.7 kg)    SpO2 98%    BMI 26.23 kg/m  General:  well developed, well nourished, in no apparent distress Lungs:  No respiratory distress Psych: well oriented with normal range of affect and age-appropriate judgement/insight, alert and oriented x4.  Assessment and Plan  PTSD (post-traumatic stress disorder)  GAD (generalized anxiety disorder)  Chronic, uncontrolled. Start Propranolol. Has not been on metoprolol routinely and having no CP. Cont w counseling. Will give psych resources again. Referral has failed. He tried calling a few places without much success. Cont Pamelor 25 mg/d, Xanax and hydroxyzine prn. Has failed Seroquel, Abilify, Prozac, Lexapro, Effexor, BuSpar, Celexa, Cymbalta. Could consider Risperdal vs Lamictal at next visit.  F/u in 1 mo. The patient voiced understanding and agreement to the plan.  [name] Pilger, DO 08/22/21 1:44 PM

## 2021-08-22 NOTE — Patient Instructions (Signed)
Stop the BuSpar.   Call some of the following places to see if they can get you in within the next few months.   Let us know if you need anything.  [facility] [address] [place], [place] [address] [phone]  [facility] [address] [place], [place] [address] [phone]  [facility] [address] [place], [place] [address] [phone]  [facility] [address], [place], [place], #[phone] [address], [place], [place], #[phone]  [facility] [address] [place], [place] [address] [phone]  [facility] [address] [place], [place] [phone]  [facility] [address] [place], [place] [phone]  [facility] [address] [place], [place]   [phone].   Please go online to complete a form to submit first.  [facility]  [address], [address] [place], [place] [phone].     [facility] -  local practices located at: [address], [place], [place]. [phone]. [address] [place], [place], [place].  [phone]. [address], [place], [place].  [phone].  Contact one of these offices sooner than later as it can take 2-3 months to get a new patient appointment.

## 2021-09-05 ENCOUNTER — Telehealth: Payer: Self-pay | Admitting: Family Medicine

## 2021-09-05 NOTE — Telephone Encounter (Signed)
Patient informed of PCP instructions. 

## 2021-09-05 NOTE — Telephone Encounter (Signed)
Updated list.

## 2021-09-05 NOTE — Telephone Encounter (Signed)
Pt states he feels like he is having some bad side effects from his new medication  propranolol ER (INDERAL LA) 60 MG 24 hr capsule . His symptoms include blurred vision, loss of balance, bad dreams, trouble sleeping, slight itchiness on his scalp, no facial swelling but does feel flushed. Since he lives alone he was concerned if he should keep taking medication since it is making feel so terrible. Advised pt robin was aware he is having problems with medication and will let Dr. Carmelia Roller know, did triage him to make sure he is okay for right now. Please advise.

## 2021-09-05 NOTE — Telephone Encounter (Signed)
Nurse Assessment Nurse: Thurmond Butts, RN, Meriam Sprague Date/Time Cesar [name] Time): 09/05/2021 11:17:10 AM Confirm and document reason for call. If symptomatic, describe symptoms. ---Caller states he has had a flushed feeling with itchy scalp with some blurred vision and dizziness. He saw the doctor the middle of december and started a new med. The doctor is going to get him in with a psychiatrist in February. The dizziness and blurred vision started 2 days ago. He has benign positional vertigo and he is not sure the med is causing all this. He is not having the dizziness or blurred vision now. The med is propranolol. He is not having the itchiness or the flushed feeling now either, but has had them the past 2 days off and on . He has not taken one of the pills today. Does the patient have any new or worsening symptoms? ---Yes Will a triage be completed? ---Yes Related visit to physician within the last 2 weeks? ---Yes Does the PT have any chronic conditions? (i.e. diabetes, asthma, this includes High risk factors for pregnancy, etc.) ---Yes List chronic conditions. ---anxiety and panic, depression, PTSD Is this a behavioral health or substance abuse call? ---No Guidelines Guideline Title Affirmed Question Affirmed Notes Nurse Date/Time (Eastern Time) Dizziness - Vertigo Taking a medicine that could cause dizziness (e.g., Thurmond Butts, RN, Meriam Sprague 09/05/2021 11:27:21 AM PLEASE NOTE: All timestamps contained within this report are represented as [place] Standard Time. CONFIDENTIALTY NOTICE: This fax transmission is intended only for the addressee. It contains information that is legally privileged, confidential or otherwise protected from use or disclosure. If you are not the intended recipient, you are strictly prohibited from reviewing, disclosing, copying using or disseminating any of this information or taking any action in reliance on or regarding this information. If you have received this fax in  error, please notify us immediately by telephone so that we can arrange for its return to [place]. Phone: [phone], Toll-Free: [phone], Fax: [phone] Page: 2 of 2 Call Id: [id number] Guidelines Guideline Title Affirmed Question Affirmed Notes Nurse Date/Time Cesar [name] Time) phenytoin [Dilantin], carbamazepine [Tegretol], primidone [Mysoline]) Disp. Time Cesar [name] Time) Disposition Final User 09/05/2021 11:29:05 AM See HCP within 4 Hours (or PCP triage) Yes Thurmond Butts, RN, Diamantina Providence Disagree/Comply Comply Caller Understands Yes PreDisposition Call Doctor Care Advice Given Per Guideline SEE HCP (OR PCP TRIAGE) WITHIN 4 HOURS: * IF OFFICE WILL BE OPEN: You need to be seen within the next 3 or 4 hours. Call your doctor (or NP/PA) now or as soon as the office opens. CALL BACK IF: * You become worse CARE ADVICE given per Dizziness - Vertigo (Adult) guideline. Comments User: Bradly Chris, RN Date/Time Cesar [name] Time): 09/05/2021 11:37:50 AM Called the backline and report given to [place]. She is going to send a message to his provider regarding him not taking the propranolol today and the symptoms leaving, so the Dr can give advice on whether he should stop the propranol, the Dr can order another med, or the Dr can bring him in for follow up. He was warm transferred and she did speak with patient. Referrals REFERRED TO PCP OFFICE

## 2021-09-05 NOTE — Telephone Encounter (Signed)
Spoke with pt. He says last does was at 2 pm yesterday afternoon and that all sxs are resolved today. He no longer wants take this RX. Asks if you want him to resume Metoprolol until his upcoming appointment.

## 2021-09-05 NOTE — Telephone Encounter (Signed)
Called the patient informed of PCP instructions. He stated he is doing so much better since stopping the medication yesterday. Agreed to do instructions. He wants to know should he restart metoprolol tartrate for Angina, he said when he start propanolol PCP stopped the metoprolol.   Advise. Call back cell number [phone]

## 2021-09-05 NOTE — Telephone Encounter (Signed)
Stop propranolol.  Check in Tues. ER if things get worse despite stopping med. Ty.

## 2021-09-05 NOTE — Addendum Note (Signed)
Addended by: Radene Gunning on: 09/05/2021 12:21 PM   Modules accepted: Orders

## 2021-09-22 ENCOUNTER — Encounter: Payer: Self-pay | Admitting: Family Medicine

## 2021-09-22 ENCOUNTER — Ambulatory Visit (INDEPENDENT_AMBULATORY_CARE_PROVIDER_SITE_OTHER): Payer: Medicare Other | Admitting: Family Medicine

## 2021-09-22 VITALS — BP 122/76 | HR 77 | Temp 98.2°F | Ht 72.0 in | Wt 194.2 lb

## 2021-09-22 DIAGNOSIS — I208 Other forms of angina pectoris: Secondary | ICD-10-CM | POA: Diagnosis not present

## 2021-09-22 DIAGNOSIS — F4310 Post-traumatic stress disorder, unspecified: Secondary | ICD-10-CM

## 2021-09-22 DIAGNOSIS — F411 Generalized anxiety disorder: Secondary | ICD-10-CM

## 2021-09-22 MED ORDER — FLUOXETINE HCL 20 MG PO TABS: 20.0000 mg | ORAL_TABLET | Freq: Every day | ORAL | 1 refills | Status: DC

## 2021-09-22 NOTE — Patient Instructions (Addendum)
Let's stop the hydroxyzine for now. If things get noticeably worse, go back on it.   Take 1/2 tab of Prozac for the first week and then 1 tab daily.   You have failed Seroquel, Pamelor, Hydroxyzine, Abilify, Prozac, Lexapro, Effexor, BuSpar, Celexa, Cymbalta.  Let us know if you need anything.

## 2021-09-22 NOTE — Progress Notes (Signed)
Chief Complaint  Patient presents with   Follow-up    Subjective Cesar [name] presents for f/u anxiety/PTSD.  Pt is currently being treated with Xanax 0.5 mg TID prn and Hydroxyzine prn, latter seeming to wear off, recently stopped Pamelor 25 mg/d.  Reports doing about the same since treatment. No thoughts of harming self or others. No self-medication with alcohol, prescription drugs or illicit drugs. Pt is following with a counselor/psychologist. Has appt w psychiatry in 2 weeks.  Past Medical History:  Diagnosis Date   Allergy    Angina at rest [facility])    Arthritis    Back pain, lumbosacral    Back pain, thoracic    Depression    Eczema    GERD (gastroesophageal reflux disease)    Prostatitis    RSD (reflex sympathetic dystrophy)    on the right side   Vertigo    Allergies as of 09/22/2021       Reactions   Formaldehyde Itching, Rash   SOLN   Latex Itching   Sulfa Antibiotics Itching, Nausea And Vomiting, Rash   15 POWD SOLN   Butorphanol Other (See Comments)   Drowsiness Drowsy Drowsiness   Celecoxib Rash   Flushing Flushing   Hydrocodone-acetaminophen Other (See Comments), Nausea And Vomiting   Metformin Other (See Comments)   Unknown   Oxycodone-acetaminophen Nausea And Vomiting   Povidone Iodine Rash   Metformin Hcl Other (See Comments)   *blurred vision, 11/06/2009   Simvastatin Other (See Comments)   Myalgias   Tamsulosin Other (See Comments)   Dyspnea   Nifedipine Nausea Only   Nitroglycerin Rash   Nitroglycerin patch developed rash and blisters from adhesive and latex   Povidone-iodine Rash   Quaternium-15 Rash   Rofecoxib Nausea Only   Flushing   Tape Itching, Rash        Medication List        Accurate as of September 22, 2021  2:00 PM. If you have any questions, ask your nurse or doctor.          STOP taking these medications    hydrOXYzine 25 MG tablet Commonly known as: ATARAX Stopped by: Sharlene Dory, DO    nortriptyline 25 MG capsule Commonly known as: Media planner Stopped by: Sharlene Dory, DO       TAKE these medications    acetaminophen 500 MG tablet Commonly known as: TYLENOL Take 500 mg by mouth every 6 (six) hours as needed.   ALPRAZolam 0.5 MG tablet Commonly known as: XANAX Take 1 tablet (0.5 mg total) by mouth 3 (three) times daily as needed for anxiety.   aspirin 81 MG chewable tablet Chew by mouth daily.   Calcium-Vitamin D 600-200 MG-UNIT tablet Take by mouth.   Fluad Quadrivalent 0.5 ML injection Generic drug: influenza vaccine adjuvanted Inject into the muscle.   FLUoxetine 20 MG tablet Commonly known as: PROZAC Take 1 tablet (20 mg total) by mouth daily. Started by: Sharlene Dory, DO   metoprolol tartrate 25 MG tablet Commonly known as: LOPRESSOR Take 12.5 mg by mouth 2 (two) times daily.   midodrine 5 MG tablet Commonly known as: PROAMATINE Take 1 tablet (5 mg total) by mouth 2 (two) times daily with a meal.   nitroGLYCERIN 0.4 MG SL tablet Commonly known as: NITROSTAT Place 1 tablet (0.4 mg total) under the tongue every 5 (five) minutes as needed for chest pain.   omeprazole 20 MG capsule Commonly known as: PRILOSEC Take 1 capsule (20  mg total) by mouth daily.   Pfizer COVID-19 Vac Bivalent injection Generic drug: COVID-19 mRNA bivalent vaccine Proofreader) Inject into the muscle.   Pfizer-BioNT COVID-19 Vac-TriS Susp injection Generic drug: COVID-19 mRNA Vac-TriS (Pfizer) Inject into the muscle.   polyethylene glycol 17 g packet Commonly known as: MIRALAX / GLYCOLAX 17 g by Does not apply route.   triamcinolone cream 0.1 % Commonly known as: KENALOG Apply to affected areas twice daily as needed        Exam BP 122/76    Pulse 77    Temp 98.2 F (36.8 C) (Oral)    Ht 6' (1.829 m)    Wt 194 lb 4 oz (88.1 kg)    SpO2 98%    BMI 26.35 kg/m  General:  well developed, well nourished, in no apparent distress Lungs:  No  respiratory distress Psych: well oriented with normal range of affect and age-appropriate judgement/insight, alert and oriented x4.  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 Discussed how he is seeing psych in 2 weeks and he would like to restart Prozac in hopes of considering 20 mg tid. 10 mg/d for 1 week and then increase to 20 mg/d.  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 He has failed many meds.  F/u in 6 mo for med ck or prn. The patient voiced understanding and agreement to the plan.  [name] [place], DO 09/22/21 2:00 PM

## 2021-10-02 ENCOUNTER — Other Ambulatory Visit: Payer: Self-pay | Admitting: Family Medicine

## 2021-10-08 ENCOUNTER — Ambulatory Visit (HOSPITAL_COMMUNITY): Payer: Self-pay | Admitting: Psychiatry

## 2021-10-14 ENCOUNTER — Ambulatory Visit (INDEPENDENT_AMBULATORY_CARE_PROVIDER_SITE_OTHER): Payer: Medicare Other

## 2021-10-14 VITALS — Ht 72.0 in | Wt 194.0 lb

## 2021-10-14 DIAGNOSIS — Z0000 Encounter for general adult medical examination without abnormal findings: Secondary | ICD-10-CM | POA: Diagnosis not present

## 2021-10-14 NOTE — Patient Instructions (Signed)
[name] , Thank you for taking time to complete your Medicare Wellness Visit. I appreciate your ongoing commitment to your health goals. Please review the following plan we discussed and let me know if I can assist you in the future.   Screening recommendations/referrals: Colonoscopy: Per our conversation, completed Cologuard within the last few months. Recommended yearly ophthalmology/optometry visit for glaucoma screening and checkup Recommended yearly dental visit for hygiene and checkup  Vaccinations: Influenza vaccine: Up to date Pneumococcal vaccine: Up to date Tdap vaccine: Up to date Shingles vaccine: Due-May obtain vaccine at your local pharmacy. Covid-19: Up to date  Advanced directives: Please bring a copy of Living Will and/or Healthcare Power of Attorney for your chart.   Conditions/risks identified: See problem list  Next appointment: Follow up in one year for your annual wellness visit.   Preventive Care 75 Years and Older, Male Preventive care refers to lifestyle choices and visits with your health care provider that can promote health and wellness. What does preventive care include? A yearly physical exam. This is also called an annual well check. Dental exams once or twice a year. Routine eye exams. Ask your health care provider how often you should have your eyes checked. Personal lifestyle choices, including: Daily care of your teeth and gums. Regular physical activity. Eating a healthy diet. Avoiding tobacco and drug use. Limiting alcohol use. Practicing safe sex. Taking low doses of aspirin every day. Taking vitamin and mineral supplements as recommended by your health care provider. What happens during an annual well check? The services and screenings done by your health care provider during your annual well check will depend on your age, overall health, lifestyle risk factors, and family history of disease. Counseling  Your health care provider may  ask you questions about your: Alcohol use. Tobacco use. Drug use. Emotional well-being. Home and relationship well-being. Sexual activity. Eating habits. History of falls. Memory and ability to understand (cognition). Work and work Astronomer. Screening  You may have the following tests or measurements: Height, weight, and BMI. Blood pressure. Lipid and cholesterol levels. These may be checked every 5 years, or more frequently if you are over 75 years old. Skin check. Lung cancer screening. You may have this screening every year starting at age 75 if you have a 30-pack-year history of smoking and currently smoke or have quit within the past 15 years. Fecal occult blood test (FOBT) of the stool. You may have this test every year starting at age 75. Flexible sigmoidoscopy or colonoscopy. You may have a sigmoidoscopy every 5 years or a colonoscopy every 10 years starting at age 75. Prostate cancer screening. Recommendations will vary depending on your family history and other risks. Hepatitis C blood test. Hepatitis B blood test. Sexually transmitted disease (STD) testing. Diabetes screening. This is done by checking your blood sugar (glucose) after you have not eaten for a while (fasting). You may have this done every 1-3 years. Abdominal aortic aneurysm (AAA) screening. You may need this if you are a current or former smoker. Osteoporosis. You may be screened starting at age 75 if you are at high risk. Talk with your health care provider about your test results, treatment options, and if necessary, the need for more tests. Vaccines  Your health care provider may recommend certain vaccines, such as: Influenza vaccine. This is recommended every year. Tetanus, diphtheria, and acellular pertussis (Tdap, Td) vaccine. You may need a Td booster every 10 years. Zoster vaccine. You may need this after  age 75. Pneumococcal 13-valent conjugate (PCV13) vaccine. One dose is recommended after age  75. Pneumococcal polysaccharide (PPSV23) vaccine. One dose is recommended after age  75. Talk to your health care provider about which screenings and vaccines you need and how often you need them. This information is not intended to replace advice given to you by your health care provider. Make sure you discuss any questions you have with your health care provider. Document Released: 09/20/2015 Document Revised: 05/13/2016 Document Reviewed: 06/25/2015 Elsevier Interactive Patient Education  2017 [place] Prevention in the Home Falls can cause injuries. They can happen to people of all ages. There are many things you can do to make your home safe and to help prevent falls. What can I do on the outside of my home? Regularly fix the edges of walkways and driveways and fix any cracks. Remove anything that might make you trip as you walk through a door, such as a raised step or threshold. Trim any bushes or trees on the path to your home. Use bright outdoor lighting. Clear any walking paths of anything that might make someone trip, such as rocks or tools. Regularly check to see if handrails are loose or broken. Make sure that both sides of any steps have handrails. Any raised decks and porches should have guardrails on the edges. Have any leaves, snow, or ice cleared regularly. Use sand or salt on walking paths during winter. Clean up any spills in your garage right away. This includes oil or grease spills. What can I do in the bathroom? Use night lights. Install grab bars by the toilet and in the tub and shower. Do not use towel bars as grab bars. Use non-skid mats or decals in the tub or shower. If you need to sit down in the shower, use a plastic, non-slip stool. Keep the floor dry. Clean up any water that spills on the floor as soon as it happens. Remove soap buildup in the tub or shower regularly. Attach bath mats securely with double-sided non-slip rug tape. Do not have throw  rugs and other things on the floor that can make you trip. What can I do in the bedroom? Use night lights. Make sure that you have a light by your bed that is easy to reach. Do not use any sheets or blankets that are too big for your bed. They should not hang down onto the floor. Have a firm chair that has side arms. You can use this for support while you get dressed. Do not have throw rugs and other things on the floor that can make you trip. What can I do in the kitchen? Clean up any spills right away. Avoid walking on wet floors. Keep items that you use a lot in easy-to-reach places. If you need to reach something above you, use a strong step stool that has a grab bar. Keep electrical cords out of the way. Do not use floor polish or wax that makes floors slippery. If you must use wax, use non-skid floor wax. Do not have throw rugs and other things on the floor that can make you trip. What can I do with my stairs? Do not leave any items on the stairs. Make sure that there are handrails on both sides of the stairs and use them. Fix handrails that are broken or loose. Make sure that handrails are as long as the stairways. Check any carpeting to make sure that it is firmly attached to the stairs.  Fix any carpet that is loose or worn. Avoid having throw rugs at the top or bottom of the stairs. If you do have throw rugs, attach them to the floor with carpet tape. Make sure that you have a light switch at the top of the stairs and the bottom of the stairs. If you do not have them, ask someone to add them for you. What else can I do to help prevent falls? Wear shoes that: Do not have high heels. Have rubber bottoms. Are comfortable and fit you well. Are closed at the toe. Do not wear sandals. If you use a stepladder: Make sure that it is fully opened. Do not climb a closed stepladder. Make sure that both sides of the stepladder are locked into place. Ask someone to hold it for you, if  possible. Clearly mark and make sure that you can see: Any grab bars or handrails. First and last steps. Where the edge of each step is. Use tools that help you move around (mobility aids) if they are needed. These include: Canes. Walkers. Scooters. Crutches. Turn on the lights when you go into a dark area. Replace any light bulbs as soon as they burn out. Set up your furniture so you have a clear path. Avoid moving your furniture around. If any of your floors are uneven, fix them. If there are any pets around you, be aware of where they are. Review your medicines with your doctor. Some medicines can make you feel dizzy. This can increase your chance of falling. Ask your doctor what other things that you can do to help prevent falls. This information is not intended to replace advice given to you by your health care provider. Make sure you discuss any questions you have with your health care provider. Document Released: 06/20/2009 Document Revised: 01/30/2016 Document Reviewed: 09/28/2014 Elsevier Interactive Patient Education  2017 [employer].

## 2021-10-14 NOTE — Progress Notes (Signed)
Subjective:   Cesar [name] is a 76 y.o. male who presents for an Initial Medicare Annual Wellness Visit.   I connected with Cesar [name] today by telephone and verified that I am speaking with the correct person using two identifiers. Location patient: home Location provider: work Persons participating in the virtual visit: patient, Engineer, civil (consulting)nurse.    I discussed the limitations, risks, security and privacy concerns of performing an evaluation and management service by telephone and the availability of in person appointments. I also discussed with the patient that there may be a patient responsible charge related to this service. The patient expressed understanding and verbally consented to this telephonic visit.    Interactive audio and video telecommunications were attempted between this provider and patient, however failed, due to patient having technical difficulties OR patient did not have access to video capability.  We continued and completed visit with audio only.  Some vital signs may be absent or patient reported.   Time Spent with patient on telephone encounter: 30 minutes  Review of Systems     Cardiac Risk Factors include: advanced age (>2255men, 71>65 women);male gender     Objective:    Today's Vitals   10/14/21 1341  Weight: 194 lb (88 kg)  Height: 6' (1.829 m)   Body mass index is 26.31 kg/m.  Advanced Directives 10/14/2021  Does Patient Have a Medical Advance Directive? Yes  Type of Estate agentAdvance Directive Healthcare Power of [place]agaAttorney;Living will  Copy of Healthcare Power of Attorney in Chart? No - copy requested    Current Medications (verified) Outpatient Encounter Medications as of 10/14/2021  Medication Sig   acetaminophen (TYLENOL) 500 MG tablet Take 500 mg by mouth every 6 (six) hours as needed.   ALPRAZolam (XANAX) 0.5 MG tablet Take 1 tablet (0.5 mg total) by mouth 3 (three) times daily as needed for anxiety.   aspirin 81 MG chewable tablet Chew by mouth daily.    Calcium-Vitamin D 600-200 MG-UNIT tablet Take by mouth.   FLUoxetine (PROZAC) 20 MG tablet Take 1 tablet (20 mg total) by mouth daily.   metoprolol tartrate (LOPRESSOR) 25 MG tablet TAKE 1/2 TABLET BY MOUTH TWICE DAILY   midodrine (PROAMATINE) 5 MG tablet Take 1 tablet (5 mg total) by mouth 2 (two) times daily with a meal.   nitroGLYCERIN (NITROSTAT) 0.4 MG SL tablet Place 1 tablet (0.4 mg total) under the tongue every 5 (five) minutes as needed for chest pain.   omeprazole (PRILOSEC) 20 MG capsule Take 1 capsule (20 mg total) by mouth daily.   polyethylene glycol (MIRALAX / GLYCOLAX) packet 17 g by Does not apply route.   triamcinolone cream (KENALOG) 0.1 % Apply to affected areas twice daily as needed   COVID-19 mRNA bivalent vaccine, Pfizer, injection Inject into the muscle. (Patient not taking: Reported on 10/14/2021)   COVID-19 mRNA Vac-TriS, Pfizer, SUSP injection Inject into the muscle. (Patient not taking: Reported on 10/14/2021)   influenza vaccine adjuvanted (FLUAD) 0.5 ML injection Inject into the muscle. (Patient not taking: Reported on 10/14/2021)   No facility-administered encounter medications on file as of 10/14/2021.    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)    Arthritis    Back pain, lumbosacral    Back pain, thoracic    Depression  Eczema    GERD (gastroesophageal reflux disease)    Prostatitis    RSD (reflex sympathetic dystrophy)    on the right side   Vertigo    Past Surgical History:  Procedure Laterality Date   BACK SURGERY  march 1998 and december 1998   carpel tunnel Left    cholecystectectomy     ESOPHAGEAL DILATION     stretching of  the esophagus   NASAL SINUS SURGERY     NASAL SINUS SURGERY     thumb joint replacement Left 2003   TONSILLECTOMY     Family History  Problem Relation Age of Onset   Allergic rhinitis Neg Hx    Angioedema Neg Hx    Asthma Neg Hx    Eczema Neg Hx    Immunodeficiency Neg Hx    Urticaria Neg Hx    Social History   Socioeconomic History   Marital status: Widowed    Spouse name: Not on file   Number of children: Not on file   Years of education: Not on file   Highest education level: Not on file  Occupational History   Not on file  Tobacco Use   Smoking status: Never   Smokeless tobacco: Never  Substance and Sexual Activity   Alcohol use: No   Drug use: No   Sexual activity: Not on file  Other Topics Concern   Not on file  Social History Narrative   Not on file   Social Determinants of Health   Financial Resource Strain: Low Risk    Difficulty of Paying Living Expenses: Not hard at all  Food Insecurity: No Food Insecurity   Worried About Running Out of Food in the Last Year: Never true   Ran Out of Food in the Last Year: Never true  Transportation Needs: No Transportation Needs   Lack of Transportation (Medical): No   Lack of Transportation (Non-Medical): No  Physical Activity: Sufficiently Active   Days of Exercise per Week: 5 days   Minutes of Exercise per Session: 30 min  Stress: No Stress Concern Present   Feeling of Stress : Not at all  Social Connections: Moderately Isolated   Frequency of Communication with Friends and Family: Three times a week   Frequency of Social Gatherings with Friends and Family: Twice a week   Attends Religious Services: Never   Database administrator or Organizations: Yes   Attends Engineer, structural: More than 4 times per year   Marital Status: Widowed    Tobacco Counseling Counseling given: Not Answered   Clinical Intake:  Pre-visit preparation completed: Yes  Pain : No/denies pain     BMI - recorded:  26.31 Nutritional Status: BMI 25 -29 Overweight Nutritional Risks: None Diabetes: No  How often do you need to have someone help you when you read instructions, pamphlets, or other written materials from your doctor or pharmacy?: 1 - Never  Diabetic?No  Interpreter Needed?: No  Information entered by :: Thomasenia Sales LPN   Activities of Daily Living In your present state of health, do you have any difficulty performing the following activities: 10/14/2021 11/20/2020  Hearing? Malvin Johns  Comment has hearing aids but does not use them all the time -  Vision? N N  Difficulty concentrating or making decisions? N N  Walking or climbing stairs? N Y  Dressing or bathing? N N  Doing errands, shopping? N N  Preparing Food and eating ? N -  Using the Toilet?  N -  In the past six months, have you accidently leaked urine? N -  Do you have problems with loss of bowel control? N -  Managing your Medications? N -  Managing your Finances? N -  Housekeeping or managing your Housekeeping? N -  Some recent data might be hidden    Patient Care Team: Sharlene Dory, DO as PCP - General (Family Medicine)  Indicate any recent Medical Services you may have received from other than [facility] providers in the past year (date may be approximate).     Assessment:   This is a routine wellness examination for Cesar [name].  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 Tepidino  Dietary issues and exercise activities discussed: Current Exercise Habits: Home exercise routine, Type of exercise: walking, Time (Minutes): 30, Frequency (Times/Week): 4, Weekly Exercise (Minutes/Week): 120, Intensity: Mild, Exercise limited by: None identified   Goals Addressed             This Visit's Progress    Patient Stated       Continue to eat healthy & walk        Depression Screen PHQ 2/9 Scores 10/14/2021 11/20/2020 11/20/2020  PHQ - 2 Score 1 6 1   PHQ- 9 Score - 16 -    Fall Risk Fall Risk  10/14/2021 02/05/2021  Falls in the past year? 0 0  Number falls in past yr: 0 0  Injury with Fall? 0 0  Risk for fall due to : - No Fall Risks  Follow up Falls prevention discussed Falls evaluation completed    FALL RISK PREVENTION PERTAINING TO THE HOME:  Any stairs in or around the home? No  Home free of loose throw rugs in walkways, pet beds, electrical cords, etc? Yes  Adequate lighting in your home to reduce risk of falls? Yes   ASSISTIVE DEVICES UTILIZED TO PREVENT FALLS:  Life alert? Yes  Use of a cane, walker or w/c? Yes cane occasionally Grab bars in the bathroom? Yes  Shower chair or bench in shower? No  Elevated toilet seat or a handicapped toilet? No   TIMED UP AND GO:  Was the test performed? No . Phone visit   Cognitive Function:Normal cognitive status assessed by this Nurse Health Advisor. No abnormalities found.          Immunizations Immunization History  Administered Date(s) Administered   Fluad Quad(high Dose 65+) 06/11/2021   Influenza, High Dose Seasonal PF 06/21/2020   PFIZER Comirnaty(Gray Top)Covid-19 Tri-Sucrose Vaccine 04/07/2021   PFIZER(Purple Top)SARS-COV-2 Vaccination 01/25/2020, 02/15/2020, 08/19/2020, 04/07/2021   Pfizer Covid-19 Vaccine Bivalent Booster 92yrs & up 06/11/2021   Pneumococcal Conjugate-13 07/04/2015   Pneumococcal Polysaccharide-23 06/28/2014   Tdap 06/06/2013   Zoster, Live 04/20/2008    TDAP status: Up to date  Flu Vaccine status: Up to date  Pneumococcal vaccine status: Up to date  Covid-19 vaccine status: Completed vaccines  Qualifies for Shingles Vaccine? Yes   Zostavax completed Yes   Shingrix Completed?: No.    Education has been provided regarding the importance of this vaccine. Patient has been advised to call insurance company to determine out of pocket expense if they  have not yet received this vaccine. Advised may also receive vaccine at local pharmacy or Health Dept. Verbalized acceptance and understanding.  Screening Tests Health Maintenance  Topic Date Due   Hepatitis C  Screening  Never done   Zoster Vaccines- Shingrix (1 of 2) Never done   TETANUS/TDAP  06/07/2023   COLONOSCOPY (Pts 45-66yrs Insurance coverage will need to be confirmed)  11/05/2025   Pneumonia Vaccine 28+ Years old  Completed   INFLUENZA VACCINE  Completed   COVID-19 Vaccine  Completed   HPV VACCINES  Aged Out    Health Maintenance  Health Maintenance Due  Topic Date Due   Hepatitis C Screening  Never done   Zoster Vaccines- Shingrix (1 of 2) Never done    Colorectal cancer screening: Type of screening: Colonoscopy. Completed 11/06/2015. Repeat every 10 years  Lung Cancer Screening: (Low Dose CT Chest recommended if Age 55-80 years, 30 pack-year currently smoking OR have quit w/in 15years.) does not qualify.     Additional Screening:  Hepatitis C Screening: Patient states he had completed at [facility]  Vision Screening: Recommended annual ophthalmology exams for early detection of glaucoma and other disorders of the eye. Is the patient up to date with their annual eye exam?  Yes  Who is the provider or what is the name of the office in which the patient attends annual eye exams? Dr. Meribeth Mattes   Dental Screening: Recommended annual dental exams for proper oral hygiene  Community Resource Referral / Chronic Care Management: CRR required this visit?  No   CCM required this visit?  No      Plan:     I have personally reviewed and noted the following in the patients chart:   Medical and social history Use of alcohol, tobacco or illicit drugs  Current medications and supplements including opioid prescriptions. Patient is not currently taking opioid prescriptions. Functional ability and status Nutritional status Physical activity Advanced directives List of  other physicians Hospitalizations, surgeries, and ER visits in previous 12 months Vitals Screenings to include cognitive, depression, and falls Referrals and appointments  In addition, I have reviewed and discussed with patient certain preventive protocols, quality metrics, and best practice recommendations. A written personalized care plan for preventive services as well as general preventive health recommendations were provided to patient.   Due to this being a telephonic visit, the after visit summary with patients personalized plan was offered to patient via mail or my-chart.  Patient would like to access on my-chart.   Roanna Raider, LPN   09/15/3788  Nurse Health Advisor  Nurse Notes: None

## 2021-11-05 ENCOUNTER — Ambulatory Visit (HOSPITAL_COMMUNITY): Payer: Self-pay | Admitting: Psychiatry

## 2021-11-11 ENCOUNTER — Ambulatory Visit (INDEPENDENT_AMBULATORY_CARE_PROVIDER_SITE_OTHER): Payer: Medicare Other | Admitting: Family Medicine

## 2021-11-11 ENCOUNTER — Other Ambulatory Visit (HOSPITAL_BASED_OUTPATIENT_CLINIC_OR_DEPARTMENT_OTHER): Payer: Self-pay

## 2021-11-11 ENCOUNTER — Encounter: Payer: Self-pay | Admitting: Family Medicine

## 2021-11-11 VITALS — BP 117/79 | HR 68 | Temp 98.1°F | Ht 72.0 in | Wt 193.4 lb

## 2021-11-11 DIAGNOSIS — I208 Other forms of angina pectoris: Secondary | ICD-10-CM | POA: Diagnosis not present

## 2021-11-11 DIAGNOSIS — K219 Gastro-esophageal reflux disease without esophagitis: Secondary | ICD-10-CM | POA: Diagnosis not present

## 2021-11-11 LAB — COMPREHENSIVE METABOLIC PANEL
ALT: 14 U/L (ref 0–53)
AST: 17 U/L (ref 0–37)
Albumin: 4.4 g/dL (ref 3.5–5.2)
Alkaline Phosphatase: 55 U/L (ref 39–117)
BUN: 15 mg/dL (ref 6–23)
CO2: 29 mEq/L (ref 19–32)
Calcium: 9.1 mg/dL (ref 8.4–10.5)
Chloride: 100 mEq/L (ref 96–112)
Creatinine, Ser: 0.87 mg/dL (ref 0.40–1.50)
GFR: 85.1 mL/min (ref >60.00)
Glucose, Bld: 76 mg/dL (ref 70–99)
Potassium: 4.1 mEq/L (ref 3.5–5.1)
Sodium: 137 mEq/L (ref 135–145)
Total Bilirubin: 0.8 mg/dL (ref 0.2–1.2)
Total Protein: 6.3 g/dL (ref 6.0–8.3)

## 2021-11-11 LAB — LIPID PANEL
Cholesterol: 181 mg/dL (ref 0–200)
HDL: 64.1 mg/dL (ref >39.00)
LDL Cholesterol: 98 mg/dL (ref 0–99)
NonHDL: 116.79
Total CHOL/HDL Ratio: 3
Triglycerides: 95 mg/dL (ref 0.0–149.0)
VLDL: 19 mg/dL (ref 0.0–40.0)

## 2021-11-11 MED ORDER — SHINGRIX 50 MCG/0.5ML IM SUSR
INTRAMUSCULAR | 1 refills | Status: DC
  Filled 2021-11-11: qty 1, 1d supply, fill #0
  Filled 2022-04-13: qty 1, 1d supply, fill #1

## 2021-11-11 MED ORDER — FLUOXETINE HCL 10 MG PO TABS: ORAL_TABLET | ORAL | 3 refills | Status: DC

## 2021-11-11 NOTE — Progress Notes (Signed)
Chief Complaint  ?Patient presents with  ? Follow-up  ?  6 month  ? ? ?Subjective: ?Angina ?Patient presents for angina follow up. ?Currently taking metoprolol 12.5 mg bid and Nitro prn and compliance with treatment thus far has been good. ?He  would have myalgias when on a statin. ?He is adhering to a healthy diet. ?Exercise: walking ?The patient is known to have coexisting coronary artery disease. ? ?GERD ?Hx of GERD. Controlled on omeprazole 20 mg/d. Compliant, no AE's. No coughing, pain, wt loss, N/V, bleeding, nighttime awakenings.  ? ?Past Medical History:  ?Diagnosis Date  ? Allergy   ? Angina at rest [facility])   ? Arthritis   ? Back pain, lumbosacral   ? Back pain, thoracic   ? Depression   ? Eczema   ? GERD (gastroesophageal reflux disease)   ? Prostatitis   ? RSD (reflex sympathetic dystrophy)   ? on the right side  ? Vertigo   ? ? ?Objective: ?BP 117/79   Pulse 68   Temp 98.1 ?F (36.7 ?C) (Oral)   Ht 6' (1.829 m)   Wt 193 lb 6 oz (87.7 kg)   SpO2 98%   BMI 26.23 kg/m?  ?General: Awake, appears stated age ?HEENT: MMM ?Heart: RRR, no LE edema, no bruits ?Lungs: CTAB, no rales, wheezes or rhonchi. No accessory muscle use ?Psych: Age appropriate judgment and insight, normal affect and mood ? ?Assessment and Plan: ?Gastroesophageal reflux disease without esophagitis ? ?Angina at rest [facility]) ? ?Chronic, stable. Cont Prilosec 20 mg/d. Discussed reflux precautions. ?Chronic, stable. Not using nitro. Cont metoprolol 12.5 mg bid. Depending on labs, would consider Livalo 1 mg/d.  ?F/u in 6 mo or prn. ?The patient voiced understanding and agreement to the plan. ? ?Sharlene Dory, DO ?11/11/21  ?1:04 PM ? ? ? ? ?

## 2021-11-11 NOTE — Patient Instructions (Addendum)
The only lifestyle changes that have data behind them are weight loss for the overweight/obese and elevating the head of the bed. Finding out which foods/positions are triggers is important. ? ?Keep the diet clean and stay active. ? ?Give [place] 2-3 business days to get the results of your labs back.  I may recommend another medication to help lower your cholesterol.  ? ?The new Shingrix vaccine (for shingles) is a 2 shot series. It can make people feel low energy, achy and almost like they have the flu for 48 hours after injection. Please plan accordingly when deciding on when to get this shot. Call your pharmacy to get this. The second shot of the series is less severe regarding the side effects, but it still lasts 48 hours.  ? ?Let us know if you need anything. ?

## 2021-11-12 ENCOUNTER — Other Ambulatory Visit: Payer: Self-pay | Admitting: Family Medicine

## 2021-11-12 ENCOUNTER — Telehealth: Payer: Self-pay

## 2021-11-12 DIAGNOSIS — E785 Hyperlipidemia, unspecified: Secondary | ICD-10-CM

## 2021-11-12 MED ORDER — PITAVASTATIN CALCIUM 1 MG PO TABS: 1.0000 mg | ORAL_TABLET | Freq: Every day | ORAL | 2 refills | Status: DC

## 2021-11-12 NOTE — Telephone Encounter (Signed)
PA approved. Effective 09/07/2021 - 11/12/2022.  ?

## 2021-11-12 NOTE — Telephone Encounter (Signed)
PA initiated via Covermymeds;KEY ;[id number]. Awaiting determination.  ?

## 2021-11-13 ENCOUNTER — Telehealth: Payer: Self-pay | Admitting: Family Medicine

## 2021-11-13 NOTE — Telephone Encounter (Signed)
Patient called stating pharmacy is saying medication is $300 ?Patient is worried that since its a statin drug he might not be able to take that. Since he has had problems before ? ?Wanted to check with wendling and what he thinks( wendling sent in lower dose for patient) ?

## 2021-11-14 MED ORDER — LOVASTATIN 10 MG PO TABS: 10.0000 mg | ORAL_TABLET | Freq: Every day | ORAL | 2 refills | Status: DC

## 2021-11-14 NOTE — Telephone Encounter (Signed)
Called left message to call back 

## 2021-11-14 NOTE — Telephone Encounter (Signed)
Sent Mevacor, should be no cost issues.  ?

## 2021-11-14 NOTE — Telephone Encounter (Signed)
Patient informed. 

## 2021-11-25 ENCOUNTER — Ambulatory Visit: Payer: Medicare Other | Admitting: Family Medicine

## 2021-11-25 ENCOUNTER — Emergency Department (HOSPITAL_BASED_OUTPATIENT_CLINIC_OR_DEPARTMENT_OTHER)
Admission: EM | Admit: 2021-11-25 | Discharge: 2021-11-25 | Disposition: A | Discharge status: Home / Self Care | Payer: Medicare Other | Attending: Emergency Medicine | Admitting: Emergency Medicine

## 2021-11-25 ENCOUNTER — Emergency Department (HOSPITAL_BASED_OUTPATIENT_CLINIC_OR_DEPARTMENT_OTHER): Payer: Medicare Other

## 2021-11-25 ENCOUNTER — Encounter (HOSPITAL_BASED_OUTPATIENT_CLINIC_OR_DEPARTMENT_OTHER): Payer: Self-pay | Admitting: Emergency Medicine

## 2021-11-25 ENCOUNTER — Other Ambulatory Visit: Payer: Self-pay

## 2021-11-25 DIAGNOSIS — U071 COVID-19: Secondary | ICD-10-CM | POA: Diagnosis not present

## 2021-11-25 DIAGNOSIS — R051 Acute cough: Secondary | ICD-10-CM

## 2021-11-25 DIAGNOSIS — Z7982 Long term (current) use of aspirin: Secondary | ICD-10-CM | POA: Insufficient documentation

## 2021-11-25 DIAGNOSIS — R0602 Shortness of breath: Secondary | ICD-10-CM | POA: Diagnosis present

## 2021-11-25 DIAGNOSIS — Z91040 Latex allergy status: Secondary | ICD-10-CM | POA: Insufficient documentation

## 2021-11-25 LAB — CBC WITH DIFFERENTIAL/PLATELET
Abs Immature Granulocytes: 0.05 10*3/uL (ref 0.00–0.07)
Basophils Absolute: 0 10*3/uL (ref 0.0–0.1)
Basophils Relative: 0 %
Eosinophils Absolute: 0 10*3/uL (ref 0.0–0.5)
Eosinophils Relative: 0 %
HCT: 39.2 % (ref 39.0–52.0)
Hemoglobin: 13.9 g/dL (ref 13.0–17.0)
Immature Granulocytes: 1 %
Lymphocytes Relative: 3 %
Lymphs Abs: 0.4 10*3/uL — ABNORMAL LOW (ref 0.7–4.0)
MCH: 31.8 pg (ref 26.0–34.0)
MCHC: 35.5 g/dL (ref 30.0–36.0)
MCV: 89.7 fL (ref 80.0–100.0)
Monocytes Absolute: 0.7 10*3/uL (ref 0.1–1.0)
Monocytes Relative: 6 %
Neutro Abs: 9.6 10*3/uL — ABNORMAL HIGH (ref 1.7–7.7)
Neutrophils Relative %: 90 %
Platelets: 171 10*3/uL (ref 150–400)
RBC: 4.37 MIL/uL (ref 4.22–5.81)
RDW: 13.7 % (ref 11.5–15.5)
WBC: 10.8 10*3/uL — ABNORMAL HIGH (ref 4.0–10.5)
nRBC: 0 % (ref 0.0–0.2)

## 2021-11-25 LAB — RESP PANEL BY RT-PCR (FLU A&B, COVID) ARPGX2
Influenza A by PCR: NEGATIVE
Influenza B by PCR: NEGATIVE
SARS Coronavirus 2 by RT PCR: POSITIVE — AB

## 2021-11-25 LAB — BASIC METABOLIC PANEL
Anion gap: 9 (ref 5–15)
BUN: 17 mg/dL (ref 8–23)
CO2: 23 mmol/L (ref 22–32)
Calcium: 8.5 mg/dL — ABNORMAL LOW (ref 8.9–10.3)
Chloride: 100 mmol/L (ref 98–111)
Creatinine, Ser: 0.85 mg/dL (ref 0.61–1.24)
GFR, Estimated: >60 mL/min (ref >60)
Glucose, Bld: 114 mg/dL — ABNORMAL HIGH (ref 70–99)
Potassium: 3.6 mmol/L (ref 3.5–5.1)
Sodium: 132 mmol/L — ABNORMAL LOW (ref 135–145)

## 2021-11-25 LAB — TROPONIN I (HIGH SENSITIVITY): Troponin I (High Sensitivity): 3 ng/L (ref <18)

## 2021-11-25 IMAGING — DX DG CHEST 1V PORT
2 series · 2 of 2 positions shown · non-contrast
Comparison: [DATE]

CLINICAL DATA: Shortness of breath.  Cough.  Nausea.

EXAM:
PORTABLE CHEST 1 VIEW

[chest ap (1 of 2)]
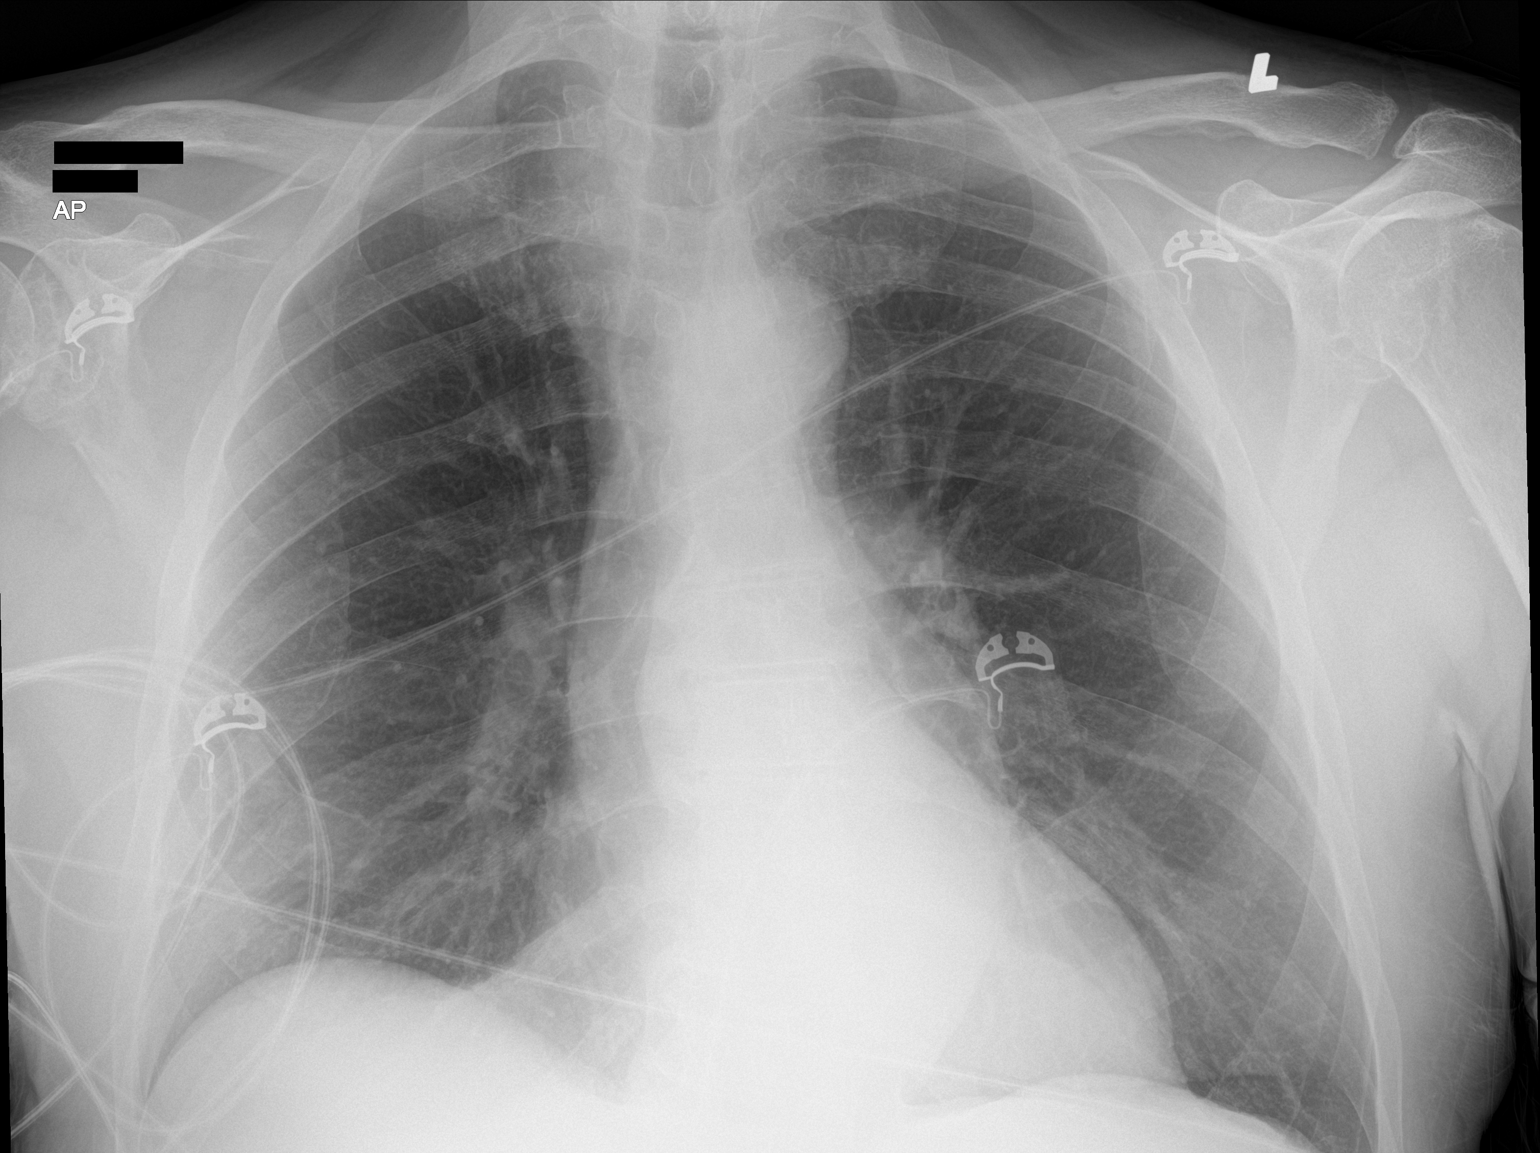

[chest ap (2 of 2)]
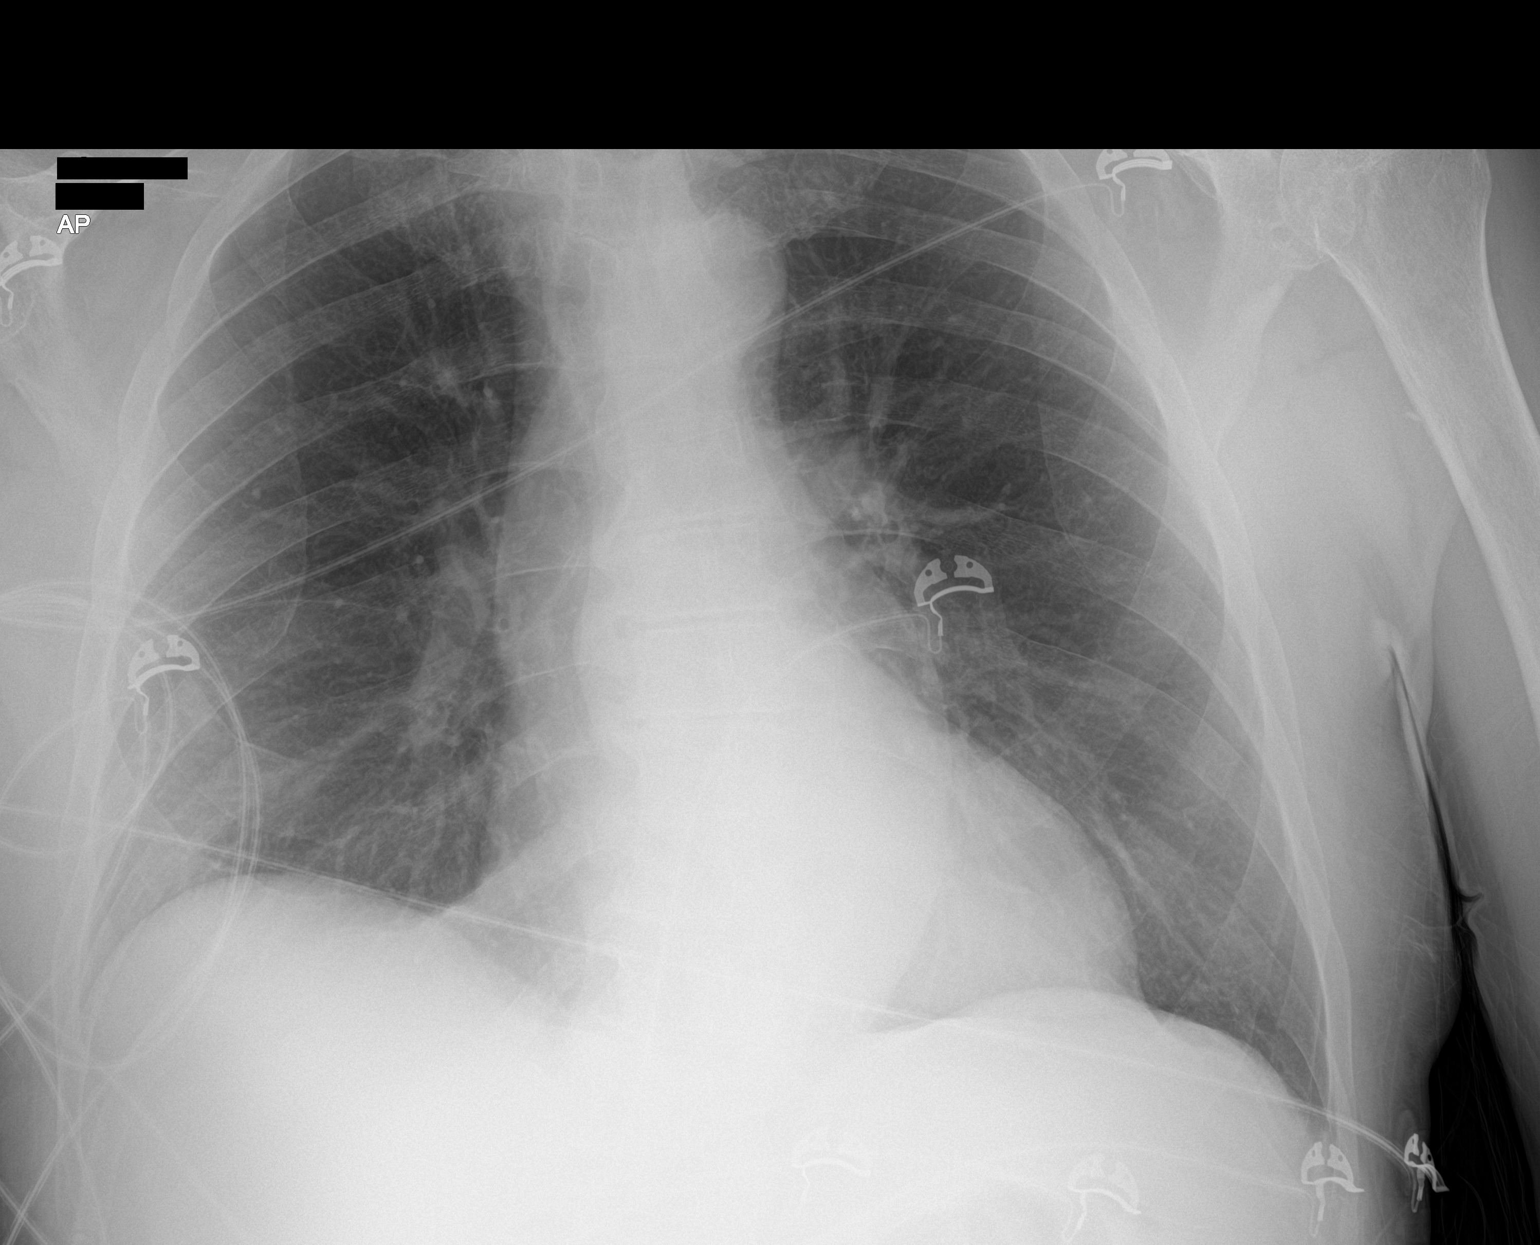

[2 of 2 positions shown; findings below may reference images not displayed]

FINDINGS: Cardiac silhouette and mediastinal contours are unchanged and within
normal limits with moderately tortuous thoracic aorta again seen.
The lungs are clear. No pleural effusion or pneumothorax. No acute
skeletal abnormality.
IMPRESSION: No acute cardiopulmonary disease process.

## 2021-11-25 MED ORDER — ONDANSETRON HCL 4 MG PO TABS: 4.0000 mg | ORAL_TABLET | Freq: Four times a day (QID) | ORAL | 0 refills | Status: DC

## 2021-11-25 MED ORDER — ONDANSETRON HCL 4 MG/2ML IJ SOLN
4.0000 mg | Freq: Once | INTRAMUSCULAR | Status: AC
  Administered 2021-11-25: 4 mg via INTRAVENOUS
  Filled 2021-11-25: qty 2

## 2021-11-25 MED ORDER — NIRMATRELVIR/RITONAVIR (PAXLOVID)TABLET: 3.0000 | ORAL_TABLET | Freq: Two times a day (BID) | ORAL | 0 refills | Status: AC

## 2021-11-25 NOTE — Discharge Instructions (Addendum)
COVID test is positive.  I have prescribed you an antiviral.  I have sent that to your pharmacy as well as nausea medicine.  Hold your lovastatin during duration of treatment for covid as antiviral has reaction with this.  ?

## 2021-11-25 NOTE — ED Triage Notes (Signed)
Pt here from home c/o cough and some nausea that started at 3 am this morning, no chest pain some slight sob  ?

## 2021-11-25 NOTE — ED Provider Notes (Addendum)
?[facility] EMERGENCY DEPARTMENT ?Provider Note ? ? ?CSN: [id number] ?Arrival date & time: 11/25/21  1549 ? ?  ? ?History ? ?Chief Complaint  ?Patient presents with  ? Shortness of Breath  ? ? ?Cesar [name] is a 76 y.o. male. ? ?The history is provided by the patient.  ?Shortness of Breath ?Severity:  Moderate ?Onset quality:  Gradual ?Duration:  1 day ?Timing:  Intermittent ?Progression:  Waxing and waning ?Chronicity:  New ?Context: URI   ?Relieved by:  Nothing ?Worsened by:  Nothing ?Associated symptoms: cough   ?Associated symptoms: no abdominal pain, no chest pain, no claudication, no diaphoresis, no ear pain, no fever, no headaches, no hemoptysis, no neck pain, no sore throat, no sputum production, no syncope, no swollen glands, no vomiting and no wheezing   ?Associated symptoms comment:  Vertigo symptoms at time today as well ? ?Risk factors comment:  Vertigo, GERD ? ?  ? ?Home Medications ?Prior to Admission medications   ?Medication Sig Start Date End Date Taking? Authorizing Provider  ?nirmatrelvir/ritonavir EUA (PAXLOVID) 20 x 150 MG & 10 x 100MG  TABS Take 3 tablets by mouth 2 (two) times daily for 5 days. Patient GFR is >60 . Take nirmatrelvir (150 mg) two tablets twice daily for 5 days and ritonavir (100 mg) one tablet twice daily for 5 days. 11/25/21 11/30/21 Yes Hernando Reali, DO  ?ondansetron (ZOFRAN) 4 MG tablet Take 1 tablet (4 mg total) by mouth every 6 (six) hours. 11/25/21  Yes Tavie Haseman, DO  ?acetaminophen (TYLENOL) 500 MG tablet Take 500 mg by mouth every 6 (six) hours as needed.    [provider]  ?ALPRAZolam 11/27/21) 0.5 MG tablet Take 1 tablet (0.5 mg total) by mouth 3 (three) times daily as needed for anxiety. 06/20/21   06/22/21, DO  ?aspirin 81 MG chewable tablet Chew by mouth daily.    [provider]  ?Calcium-Vitamin D 600-200 MG-UNIT tablet Take by mouth.    [provider]  ?COVID-19 mRNA bivalent vaccine, Pfizer, injection  Inject into the muscle. 06/11/21   08/11/21, MD  ?COVID-19 mRNA Vac-TriS, Pfizer, SUSP injection Inject into the muscle. 04/07/21   06/07/21, MD  ?FLUoxetine (PROZAC) 10 MG tablet Take 1 tab 3 times daily. 11/11/21   01/11/22, DO  ?influenza vaccine adjuvanted (FLUAD) 0.5 ML injection Inject into the muscle. 06/11/21   08/11/21, MD  ?lovastatin (MEVACOR) 10 MG tablet Take 1 tablet (10 mg total) by mouth at bedtime. 11/14/21   01/14/22, DO  ?metoprolol tartrate (LOPRESSOR) 25 MG tablet TAKE 1/2 TABLET BY MOUTH TWICE DAILY 10/03/21   10/05/21, DO  ?midodrine (PROAMATINE) 5 MG tablet Take 1 tablet (5 mg total) by mouth 2 (two) times daily with a meal. 03/07/21   Wendling, 05/08/21, DO  ?nitroGLYCERIN (NITROSTAT) 0.4 MG SL tablet Place 1 tablet (0.4 mg total) under the tongue every 5 (five) minutes as needed for chest pain. 02/05/21   04/07/21, DO  ?omeprazole (PRILOSEC) 20 MG capsule Take 1 capsule (20 mg total) by mouth daily. 03/07/21   05/08/21, DO  ?polyethylene glycol (MIRALAX / GLYCOLAX) packet 17 g by Does not apply route.    [provider]  ?triamcinolone cream (KENALOG) 0.1 % Apply to affected areas twice daily as needed 09/27/15   [provider]  ?Zoster Vaccine Adjuvanted [facility]) injection Inject into the muscle. 11/11/21   01/11/22, MD  ?   ? ?  Allergies    ?Formaldehyde, Latex, Sulfa antibiotics, Butorphanol, Celecoxib, Hydrocodone-acetaminophen, Metformin, Oxycodone-acetaminophen, Povidone iodine, Metformin hcl, Simvastatin, Tamsulosin, Nifedipine, Nitroglycerin, Povidone-iodine, Quaternium-15, Rofecoxib, and Tape   ? ?Review of Systems   ?Review of Systems  ?Constitutional:  Negative for diaphoresis and fever.  ?HENT:  Negative for ear pain and sore throat.   ?Respiratory:  Positive for cough and shortness of breath. Negative for hemoptysis, sputum production and wheezing.   ?Cardiovascular:   Negative for chest pain, claudication and syncope.  ?Gastrointestinal:  Negative for abdominal pain and vomiting.  ?Musculoskeletal:  Negative for neck pain.  ?Neurological:  Negative for headaches.  ? ?Physical Exam ?Updated Vital Signs ? ?ED Triage Vitals  ?Enc Vitals Group  ?   BP 11/25/21 1557 118/83  ?   Pulse Rate 11/25/21 1557 74  ?   Resp 11/25/21 1557 15  ?   Temp 11/25/21 1557 98.3 ?F (36.8 ?C)  ?   Temp Source 11/25/21 1557 Oral  ?   SpO2 11/25/21 1557 100 %  ?   Weight 11/25/21 1556 193 lb (87.5 kg)  ?   Height 11/25/21 1556 6' (1.829 m)  ?   Head Circumference --   ?   Peak Flow --   ?   Pain Score 11/25/21 1556 0  ?   Pain Loc --   ?   Pain Edu? --   ?   Excl. in GC? --   ? ? ?Physical Exam ?Vitals and nursing note reviewed.  ?Constitutional:   ?   General: He is not in acute distress. ?   Appearance: He is well-developed.  ?HENT:  ?   Head: Normocephalic and atraumatic.  ?   Mouth/Throat:  ?   Mouth: Mucous membranes are moist.  ?Eyes:  ?   Extraocular Movements: Extraocular movements intact.  ?   Conjunctiva/sclera: Conjunctivae normal.  ?   Pupils: Pupils are equal, round, and reactive to light.  ?Cardiovascular:  ?   Rate and Rhythm: Normal rate and regular rhythm.  ?   Pulses: Normal pulses.  ?   Heart sounds: No murmur heard. ?Pulmonary:  ?   Effort: Pulmonary effort is normal. No respiratory distress.  ?   Breath sounds: Normal breath sounds. No decreased breath sounds or wheezing.  ?Abdominal:  ?   Palpations: Abdomen is soft.  ?   Tenderness: There is no abdominal tenderness.  ?Musculoskeletal:     ?   General: No swelling. Normal range of motion.  ?   Cervical back: Normal range of motion and neck supple.  ?   Right lower leg: No edema.  ?   Left lower leg: No edema.  ?Skin: ?   General: Skin is warm and dry.  ?   Capillary Refill: Capillary refill takes less than 2 seconds.  ?Neurological:  ?   General: No focal deficit present.  ?   Mental Status: He is alert and oriented to person, place,  and time.  ?   Cranial Nerves: No cranial nerve deficit.  ?   Motor: No weakness.  ?   Comments: 5+/5 strength, normal sensation, normal speech, no drift, no vision changes, normal visual fields  ?Psychiatric:     ?   Mood and Affect: Mood normal.  ? ? ?ED Results / Procedures / Treatments   ?Labs ?(all labs ordered are listed, but only abnormal results are displayed) ?Labs Reviewed  ?RESP PANEL BY RT-PCR (FLU A&B, COVID) ARPGX2 - Abnormal; Notable for the following  components:  ?    Result Value  ? SARS Coronavirus 2 by RT PCR POSITIVE (*)   ? All other components within normal limits  ?CBC WITH DIFFERENTIAL/PLATELET - Abnormal; Notable for the following components:  ? WBC 10.8 (*)   ? Neutro Abs 9.6 (*)   ? Lymphs Abs 0.4 (*)   ? All other components within normal limits  ?BASIC METABOLIC PANEL - Abnormal; Notable for the following components:  ? Sodium 132 (*)   ? Glucose, Bld 114 (*)   ? Calcium 8.5 (*)   ? All other components within normal limits  ?TROPONIN I (HIGH SENSITIVITY)  ? ? ?EKG ?EKG Interpretation ? ?Date/Time:  Tuesday November 25 2021 15:58:05 EDT ?Ventricular Rate:  75 ?PR Interval:  181 ?QRS Duration: 102 ?QT Interval:  399 ?QTC Calculation: 446 ?R Axis:   -61 ?Text Interpretation: Sinus rhythm Confirmed by Virgina Norfolkuratolo, Michaeal Davis [id number]) on 11/25/2021 3:59:34 PM ? ?Radiology ?DG Chest Portable 1 View ? ?Result Date: 11/25/2021 ?CLINICAL DATA:  Shortness of breath.  Cough.  Nausea. EXAM: PORTABLE CHEST 1 VIEW COMPARISON:  06/02/2019 FINDINGS: Cardiac silhouette and mediastinal contours are unchanged and within normal limits with moderately tortuous thoracic aorta again seen. The lungs are clear. No pleural effusion or pneumothorax. No acute skeletal abnormality. IMPRESSION: No acute cardiopulmonary disease process. Electronically Signed   By: Neita Garnetonald  Viola M.D.   On: 11/25/2021 16:29   ? ?Procedures ?Procedures  ? ? ?Medications Ordered in ED ?Medications  ?ondansetron (ZOFRAN) injection 4 mg (4 mg Intravenous  Given 11/25/21 1655)  ? ? ?ED Course/ Medical Decision Making/ A&P ?  ?                        ?Medical Decision Making ?Amount and/or Complexity of Data Reviewed ?Labs: ordered. ?Radiology: ordered. ? ?R

## 2021-11-26 ENCOUNTER — Telehealth: Payer: Self-pay | Admitting: Family Medicine

## 2021-11-26 NOTE — Telephone Encounter (Signed)
Pt stated he was seen at ED yesterday for covid. Reccommended to follow up with pcp in 10 days, but Dr.Wendling will be out of office. Scheduled him for next Wednesday virtually, advised pt we can see him in the office instead but he does not want to get anyone sick. Please advise pt if you would rather him come in the office.  ?

## 2021-12-03 ENCOUNTER — Telehealth (INDEPENDENT_AMBULATORY_CARE_PROVIDER_SITE_OTHER): Payer: Medicare Other | Admitting: Family Medicine

## 2021-12-03 ENCOUNTER — Encounter: Payer: Self-pay | Admitting: Family Medicine

## 2021-12-03 DIAGNOSIS — U071 COVID-19: Secondary | ICD-10-CM

## 2021-12-03 NOTE — Progress Notes (Signed)
Chief Complaint  ?Patient presents with  ? Follow-up  ?  covid  ? ? ?Subjective: ?Patient is a 76 y.o. male here for f/u covid. Due to COVID-19 pandemic, we are interacting via telephone. I verified patient's ID using 2 identifiers. Patient agreed to proceed with visit via this method. Patient is at home, I am at office. Patient and I are present for visit.  ? ?Pt dx'd w covid on 11/25/21. He tested + in ED and was tx'd Paxlovid. S/s's started on 11/24/21. Still having some coughing, runny/stuffy nose. Denies fevers, myalgias, , wheezing, sob, ST, N/V/D, poor PO intake, ear pain/drainage from ears. No sick contacts known.  Using Delsym which is helping with the cough.  ? ?Past Medical History:  ?Diagnosis Date  ? Allergy   ? Angina at rest [facility])   ? Arthritis   ? Back pain, lumbosacral   ? Back pain, thoracic   ? Depression   ? Eczema   ? GERD (gastroesophageal reflux disease)   ? Prostatitis   ? RSD (reflex sympathetic dystrophy)   ? on the right side  ? Vertigo   ? ? ?Objective: ?No conversational dyspnea ?Age appropriate judgment and insight ?Nml affect and mood ? ?Assessment and Plan: ?COVID-19 ? ?S/p Paxlovid. Cont pushing fluids. Cont Delsym. Discussed quarantining guidelines.  ?Total time: 7 min ?The patient voiced understanding and agreement to the plan. ? ?Sharlene Dory, DO ?12/03/21  ?10:26 AM ? ? ? ? ?

## 2021-12-05 ENCOUNTER — Telehealth: Payer: Self-pay | Admitting: Family Medicine

## 2021-12-05 NOTE — Telephone Encounter (Signed)
Pt states he is concerned because he is still testing positive for covid. Advised him it can take some time as he was just sick and asked if he is still having sxs. He does get dizzy but he stated that was from the vertigo. He has no fever but mild cough and congestion. Advised him if he was concerned we could transfer to triage to speak with a nurse about sxs. He stated he will wait until Robin or Dr.Wendling is available next week. Please advise if anyone would be able to speak with him sooner.  ?

## 2021-12-08 NOTE — Telephone Encounter (Signed)
Called the patient and he is doing better today. ?The question he did have is that he has congestion in his chest and a little cough, he has no fever/no sore throat. ?Just a cough and congestion.  ?He found some cipro 500 mg (has 10 tablets) and amoxicillin 875/125 mg tablets (has 10 tablets) left over from a previous  illness. The patient wants to know if he can take either of these to help clear up his congestion. ? ? ?

## 2021-12-08 NOTE — Telephone Encounter (Signed)
Called and informed of PCP instructions ?He verbalized understanding and agreement. ?

## 2021-12-11 ENCOUNTER — Other Ambulatory Visit: Payer: Self-pay | Admitting: Family Medicine

## 2021-12-26 ENCOUNTER — Other Ambulatory Visit (INDEPENDENT_AMBULATORY_CARE_PROVIDER_SITE_OTHER): Payer: Medicare Other

## 2021-12-26 DIAGNOSIS — E785 Hyperlipidemia, unspecified: Secondary | ICD-10-CM

## 2021-12-26 LAB — LIPID PANEL
Cholesterol: 171 mg/dL (ref 0–200)
HDL: 64.2 mg/dL (ref >39.00)
LDL Cholesterol: 89 mg/dL (ref 0–99)
NonHDL: 106.56
Total CHOL/HDL Ratio: 3
Triglycerides: 87 mg/dL (ref 0.0–149.0)
VLDL: 17.4 mg/dL (ref 0.0–40.0)

## 2021-12-30 ENCOUNTER — Encounter: Payer: Self-pay | Admitting: Family Medicine

## 2021-12-30 ENCOUNTER — Ambulatory Visit (INDEPENDENT_AMBULATORY_CARE_PROVIDER_SITE_OTHER): Payer: Medicare Other | Admitting: Family Medicine

## 2021-12-30 VITALS — BP 120/82 | HR 72 | Temp 97.6°F | Ht 72.0 in | Wt 201.0 lb

## 2021-12-30 DIAGNOSIS — D692 Other nonthrombocytopenic purpura: Secondary | ICD-10-CM

## 2021-12-30 DIAGNOSIS — L989 Disorder of the skin and subcutaneous tissue, unspecified: Secondary | ICD-10-CM

## 2021-12-30 DIAGNOSIS — L570 Actinic keratosis: Secondary | ICD-10-CM

## 2021-12-30 NOTE — Patient Instructions (Addendum)
Give the psychiatrist a few more visits. If we feel like we aren't making progress, OK to reconvene with me.  ? ?If these lesions don't fall off in the next month, I want to see you again or you need to follow up with your dermatologist.  ? ?Don't worry about the bruising, this happens more as we get older.  ? ?Let us know if you need anything. ?

## 2021-12-30 NOTE — Progress Notes (Signed)
Chief Complaint  ?Patient presents with  ? Bleeding/Bruising  ? ? ?Cesar [name] is a 76 y.o. male here for a skin complaint. ? ?Duration: 1 month for arm, 4-5 mo for back of neck ?Location: back of neck and R forearm ?Pruritic? No ?Painful? No ?Drainage? No ?New soaps/lotions/topicals/detergents? No ?Sick contacts? No ?Other associated symptoms: scaly ?Therapies tried thus far: none ?Has some bruising as well.  ? ?Past Medical History:  ?Diagnosis Date  ? Allergy   ? Angina at rest [facility])   ? Arthritis   ? Back pain, lumbosacral   ? Back pain, thoracic   ? Depression   ? Eczema   ? GERD (gastroesophageal reflux disease)   ? Prostatitis   ? RSD (reflex sympathetic dystrophy)   ? on the right side  ? Vertigo   ? ? ?BP 120/82   Pulse 72   Temp 97.6 ?F (36.4 ?C) (Oral)   Ht 6' (1.829 m)   Wt 201 lb (91.2 kg)   SpO2 98%   BMI 27.26 kg/m?  ?Gen: awake, alert, appearing stated age ?Lungs: No accessory muscle use ?Skin: purpura noted on dorsum of hands and forearms. Scaly lesions with erythematous base on R forearm, 4 on L posterior neck near hairline ?Psych: Age appropriate judgment and insight ? ?Procedure note: cryotherapy ?Verbal consent obtained ?6 skin lesions treated ?Liquid nitrogen was applied via a thin spray creating an ice ball with 1-2 mm corona surrounding the lesion ?The patient tolerated the procedure well ?There were no immediate complications noted ? ?Senile purpura (HCC) ? ?Skin lesion - Plan: PR DESTRUC PREMAL,FIRST LESION, PR DESTRUC BENIGN/PREMAL,2-14 LESIONS ? ?Actinic keratoses - Plan: PR DESTRUC PREMAL,FIRST LESION, PR DESTRUC BENIGN/PREMAL,2-14 LESIONS ? ?Reassurance. ?Cryotherapy today. Will biopsy if no better. Cannot get in w derm for 5 months. ?As above.  ?F/u prn. ?The patient voiced understanding and agreement to the plan. ? ?Sharlene Dory, DO ?12/30/21 ?3:16 PM ? ?

## 2022-01-12 ENCOUNTER — Telehealth: Payer: Self-pay

## 2022-01-12 ENCOUNTER — Ambulatory Visit (INDEPENDENT_AMBULATORY_CARE_PROVIDER_SITE_OTHER): Payer: Medicare Other | Admitting: Family Medicine

## 2022-01-12 ENCOUNTER — Encounter: Payer: Self-pay | Admitting: Family Medicine

## 2022-01-12 VITALS — BP 120/75 | HR 83 | Temp 98.2°F | Ht 72.0 in | Wt 199.5 lb

## 2022-01-12 DIAGNOSIS — R441 Visual hallucinations: Secondary | ICD-10-CM | POA: Diagnosis not present

## 2022-01-12 DIAGNOSIS — H8110 Benign paroxysmal vertigo, unspecified ear: Secondary | ICD-10-CM

## 2022-01-12 DIAGNOSIS — R1319 Other dysphagia: Secondary | ICD-10-CM

## 2022-01-12 MED ORDER — ONDANSETRON 4 MG PO TBDP: 4.0000 mg | ORAL_TABLET | Freq: Three times a day (TID) | ORAL | 0 refills | Status: DC | PRN

## 2022-01-12 MED ORDER — DIAZEPAM 10 MG PO TABS: ORAL_TABLET | ORAL | 0 refills | Status: DC

## 2022-01-12 NOTE — Progress Notes (Signed)
Chief Complaint  ?Patient presents with  ? Nausea  ?  Dizziness ?  ? Anxiety  ?  Increased anxiety ?hallucinations  ? ? ?Cesar [name] is 76 y.o. pt here for dizziness. ? ?Duration: 2 weeks ?Pass out? No ?Lasts for a few seconds, happens with head movements.  ?Spinning? Yes ?Recent illness/fever? No ?Headache? No ?Neurologic signs? No ?Change in PO intake? 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  ? ?Hallucinations: worse over the past few weeks, has happened for decades. 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  Lately he has been seeing his wife more frequently and having short conversations with her.  She does not give him instructions or tell him things to do.  He states she will be standing in the doorway speaking with him and then go off to another room and not be there when he goes there.  Sometimes he states that she will come to the side of his bed making small talk and then disappear.  No difficulty with speech, vision changes, balance issues that are new, new weakness. ? ?Patient has a history of esophageal stricture requiring dilatation.  Over the past few weeks, it it has been more difficult to swallow.  He does not currently have a gastroenterologist we follows with and is requesting a referral.  He is not throwing up or regurgitating.  No unexplained weight changes. Feels things are getting stuck in his chest, similar to previous episode.  ? ?Past Medical History:  ?Diagnosis Date  ? Allergy   ? Angina at rest [facility])   ? Arthritis   ? Back pain, lumbosacral   ? Back pain, thoracic   ? Depression   ? Eczema   ? GERD (gastroesophageal reflux disease)   ? Prostatitis   ? RSD (reflex sympathetic dystrophy)   ? on the right side  ? Vertigo   ? ? ?Family History  ?Problem Relation Age of Onset  ? Allergic rhinitis Neg Hx   ? Angioedema Neg Hx   ? Asthma Neg Hx   ? Eczema Neg Hx   ? Immunodeficiency Neg Hx   ? Urticaria Neg Hx   ? ? ?Allergies as of 01/12/2022   ? ?   Reactions  ?  Formaldehyde Itching, Rash  ? SOLN  ? Latex Itching  ? Sulfa Antibiotics Itching, Nausea And Vomiting, Rash  ? 15 POWD ?SOLN  ? Butorphanol Other (See Comments)  ? Drowsiness ?Drowsy ?Drowsiness  ? Celecoxib Rash  ? Flushing ?Flushing  ? Hydrocodone-acetaminophen Other (See Comments), Nausea And Vomiting  ? Metformin Other (See Comments)  ? Unknown  ? Oxycodone-acetaminophen Nausea And Vomiting  ? Povidone Iodine Rash  ? Metformin Hcl Other (See Comments)  ? *blurred vision, 11/06/2009  ? Simvastatin Other (See Comments)  ? Myalgias  ? Tamsulosin Other (See Comments)  ? Dyspnea  ? Nifedipine Nausea Only  ? Nitroglycerin Rash  ? Nitroglycerin patch developed rash and blisters from adhesive and latex  ? Povidone-iodine Rash  ? Quaternium-15 Rash  ? Rofecoxib Nausea Only  ? Flushing  ? Tape Itching, Rash  ? ?  ? ?  ?Medication List  ?  ? ?  ? Accurate as of Jan 12, 2022 12:02 PM. If you have any questions, ask your nurse or doctor.  ?  ?  ? ?  ? ?aspirin 81 MG chewable tablet ?Chew by mouth daily. ?  ?Calcium-Vitamin D 600-200 MG-UNIT tablet ?Take by mouth. ?  ?diazepam  10 MG tablet ?Commonly known as: VALIUM ?Take 1 tab 45 min prior to MRI, repeat 1/2 tab in 20-30 min if no improvement. ?Started by: Sharlene Dory, DO ?  ?Fluad Quadrivalent 0.5 ML injection ?Generic drug: influenza vaccine adjuvanted ?Inject into the muscle. ?  ?FLUoxetine 10 MG tablet ?Commonly known as: PROZAC ?Take 1 tab 3 times daily. ?  ?lovastatin 10 MG tablet ?Commonly known as: MEVACOR ?Take 1 tablet (10 mg total) by mouth at bedtime. ?  ?metoprolol tartrate 25 MG tablet ?Commonly known as: LOPRESSOR ?TAKE 1/2 TABLET BY MOUTH TWICE DAILY ?  ?midodrine 5 MG tablet ?Commonly known as: PROAMATINE ?Take 1 tablet (5 mg total) by mouth 2 (two) times daily with a meal. ?  ?nitroGLYCERIN 0.4 MG SL tablet ?Commonly known as: NITROSTAT ?Place 1 tablet (0.4 mg total) under the tongue every 5 (five) minutes as needed for chest pain. ?  ?omeprazole 20  MG capsule ?Commonly known as: PRILOSEC ?Take 1 capsule (20 mg total) by mouth daily. ?  ?ondansetron 4 MG disintegrating tablet ?Commonly known as: ZOFRAN-ODT ?Take 1 tablet (4 mg total) by mouth every 8 (eight) hours as needed for nausea or vomiting. ?Started by: Sharlene Dory, DO ?  ?ondansetron 4 MG tablet ?Commonly known as: ZOFRAN ?Take 1 tablet (4 mg total) by mouth every 6 (six) hours. ?  ?Pfizer COVID-19 Vac Bivalent injection ?Generic drug: COVID-19 mRNA bivalent vaccine [employer]) ?Inject into the muscle. ?  ?Pfizer-BioNT COVID-19 Vac-TriS Susp injection ?Generic drug: COVID-19 mRNA Vac-TriS [employer]) ?Inject into the muscle. ?  ?polyethylene glycol 17 g packet ?Commonly known as: MIRALAX / GLYCOLAX ?17 g by Does not apply route. ?  ?Shingrix injection ?Generic drug: Zoster Vaccine Adjuvanted ?Inject into the muscle. ?  ?triamcinolone cream 0.1 % ?Commonly known as: KENALOG ?Apply to affected areas twice daily as needed ?  ? ?  ? ? ?BP 120/75   Pulse 83   Temp 98.2 ?F (36.8 ?C) (Oral)   Ht 6' (1.829 m)   Wt 199 lb 8 oz (90.5 kg)   SpO2 98%   BMI 27.06 kg/m?  ?General: Awake, alert, appears stated age ?Eyes: PERRLA, EOMi ?Heart: RRR ?Lungs: CTAB, no accessory muscle use ?Neuro:  5/5 strength throughout with the exception of 4/5 left hip flexion which is chronic for him, gait slow and cautious; No cerebellar signs, patellar reflex 1/4 b/l wo clonus, calcaneal reflex 0/4 b/l wo clonus, biceps reflex 1/4 b/l wo clonus; ?Psych: Age appropriate judgment and insight, normal mood and affect ? ?Benign paroxysmal positional vertigo, unspecified laterality ? ?Visual hallucinations - Plan: MR Brain Wo Contrast ? ?Esophageal dysphagia - Plan: Ambulatory referral to Gastroenterology ? ?We will send in some Zofran to help with the nausea, needs to get back on the Epley maneuver.  If he is having trouble over the next couple days, he will let us know and we will set him up with the vestibular rehab  team. ?Given worsening, we will check an MRI of his brain.  These do seem benign in nature but want to rule out any structural involvement. ?Chronic, worsening.  Refer to gastroenterology through [facility] at this request. ?F/u as originally scheduled ?Pt voiced understanding and agreement to the plan. ? ?Sharlene Dory, DO ?01/12/22 ?12:02 PM ? ?

## 2022-01-12 NOTE — Telephone Encounter (Signed)
Caller Name Nhia Heaphy ?Caller Phone Number [phone] ?Patient Name Cesar [name] ?Patient DOB [birth year] ?Call Type Message Only Information Provided ?Reason for Call Request to Schedule Office Appointment ?Initial Comment Caller states he is wanting to schedule an appt. ?Patient request to speak to RN No ?Additional Comment Pt is wanting to schedule an appt. ?Disp. Time Disposition Final User ?01/10/2022 7:00:03 PM General Information Provided Yes Baker Pierini ?Call Closed By: Baker Pierini ?Transaction Date/Time: 01/10/2022 6:54:52 PM (ET) ?

## 2022-01-12 NOTE — Patient Instructions (Addendum)
Try the Epley Maneuver again. Let me know if you want to see physical therapy to help with this.  ? ?We will be in touch regarding your MRI results. ? ?If you do not hear anything about your referral in the next 1-2 weeks, call our office and ask for an update. ? ?Let us know if you need anything. ?

## 2022-01-12 NOTE — Telephone Encounter (Signed)
Called the patient and scheduled appt for today at 11:15 ?For nausea/dizziness. ?

## 2022-01-19 ENCOUNTER — Telehealth: Payer: Self-pay | Admitting: Family Medicine

## 2022-01-19 MED ORDER — PROCHLORPERAZINE MALEATE 5 MG PO TABS: 5.0000 mg | ORAL_TABLET | Freq: Four times a day (QID) | ORAL | 0 refills | Status: DC | PRN

## 2022-01-19 NOTE — Telephone Encounter (Signed)
Patient informed sent in. 

## 2022-01-19 NOTE — Telephone Encounter (Signed)
Pt stated zofran does not seem to be helping and his pharmacist recommended maybe trying phenergan. Please advise.  ? ? ?[facility] - [place], [place] - [address]  ? [address], [place] [address]  ?Phone:  [phone]  Fax:  [phone]  ?

## 2022-01-19 NOTE — Telephone Encounter (Signed)
Alt sent.  

## 2022-01-20 ENCOUNTER — Encounter: Payer: Self-pay | Admitting: Physician Assistant

## 2022-02-04 ENCOUNTER — Other Ambulatory Visit (HOSPITAL_BASED_OUTPATIENT_CLINIC_OR_DEPARTMENT_OTHER): Payer: Self-pay

## 2022-02-04 ENCOUNTER — Ambulatory Visit (HOSPITAL_BASED_OUTPATIENT_CLINIC_OR_DEPARTMENT_OTHER)
Admission: RE | Admit: 2022-02-04 | Discharge: 2022-02-04 | Disposition: A | Discharge status: Home / Self Care | Payer: Medicare Other | Source: Ambulatory Visit | Attending: Family Medicine | Admitting: Family Medicine

## 2022-02-04 ENCOUNTER — Other Ambulatory Visit: Payer: Self-pay | Admitting: Family Medicine

## 2022-02-04 DIAGNOSIS — R441 Visual hallucinations: Secondary | ICD-10-CM

## 2022-02-04 IMAGING — MR MR HEAD W/O CM
10 series · 48 of 48 positions shown · non-contrast
Comparison: Report from brain MRI [DATE] (images unavailable).
Report from head CT [DATE] (images unavailable)

CLINICAL DATA: Provided history: Visual hallucinations. Psychosis

EXAM:
MRI HEAD WITHOUT CONTRAST
TECHNIQUE: Multiplanar, multiecho pulse sequences of the brain and surrounding
structures were obtained without intravenous contrast.

[Series 3: DWI · axial · 3.0mm · 1.25mm/px · z∈[-72,+83]mm · 9 of 108 slices shown (1 of 4)]
[im 1/108]
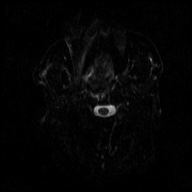
[im 14/108]
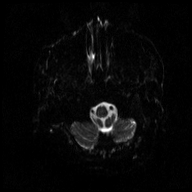
[im 27/108]
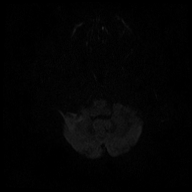
[im 41/108]
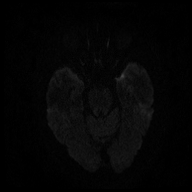
[im 54/108]
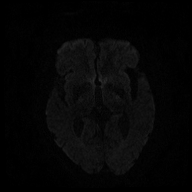
[im 67/108]
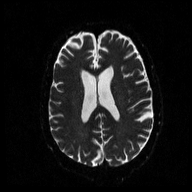
[im 81/108]
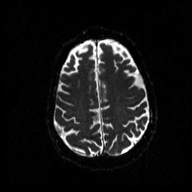
[im 94/108]
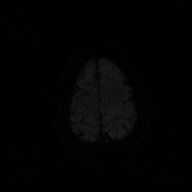
[im 108/108]
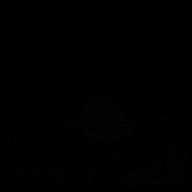

[Series 4: DWI · axial · 3.0mm · 1.25mm/px · z∈[-72,+83]mm · 4 of 54 slices shown (2 of 4)]
[im 1/54]
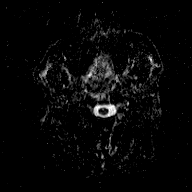
[im 18/54]
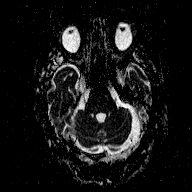
[im 36/54]
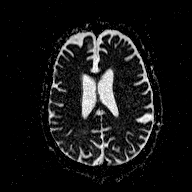
[im 54/54]
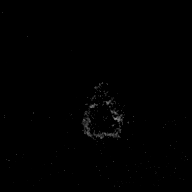

[Series 5: DWI · coronal · 5.0mm · 1.12mm/px · 5 of 65 slices shown (3 of 4)]
[im 1/65]
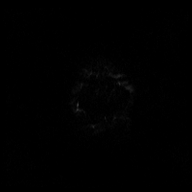
[im 17/65]
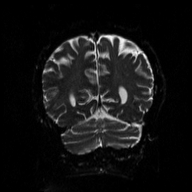
[im 33/65]
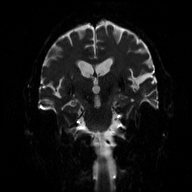
[im 49/65]
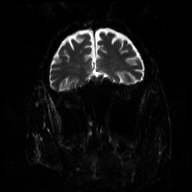
[im 65/65]
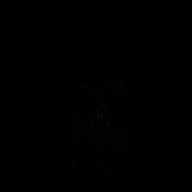

[Series 6: DWI · coronal · 5.0mm · 1.12mm/px · 3 of 33 slices shown (4 of 4)]
[im 1/33]
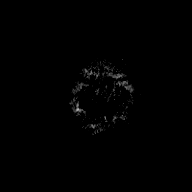
[im 17/33]
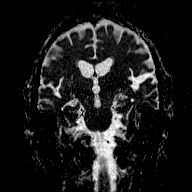
[im 33/33]
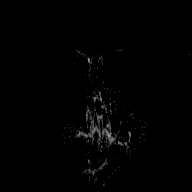

[Series 7: T1 · sagittal · 5.0mm · 0.49mm/px · 2 of 27 slices shown (1 of 2)]
[im 1/27]
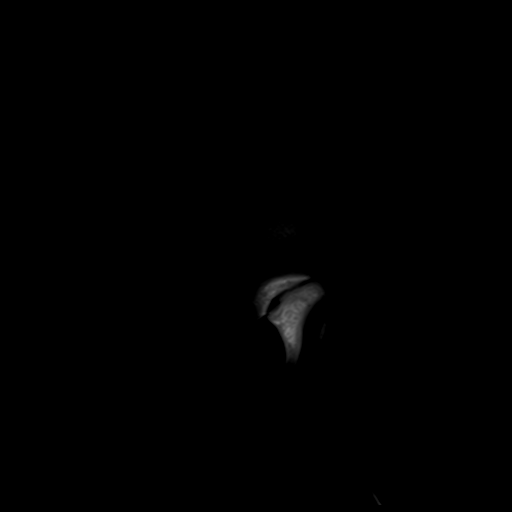
[im 27/27]
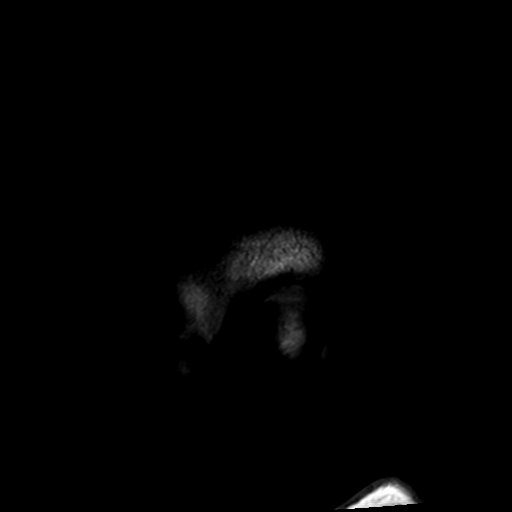

[Series 8: T2 · axial · 5.0mm · 0.72mm/px · z∈[-73,+81]mm · 2 of 23 slices shown (1 of 3)]
[im 1/23]
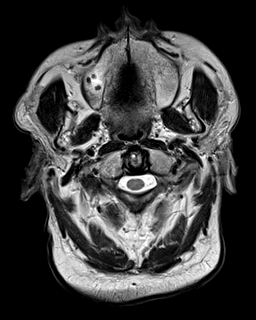
[im 23/23]
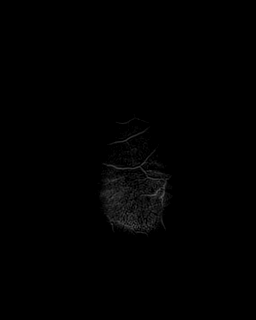

[Series 9: FLAIR · axial · 3.0mm · 0.45mm/px · z∈[-74,+81]mm · 4 of 55 slices shown]
[im 1/55]
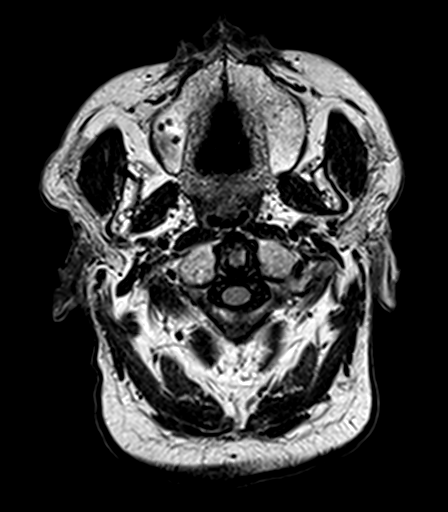
[im 19/55]
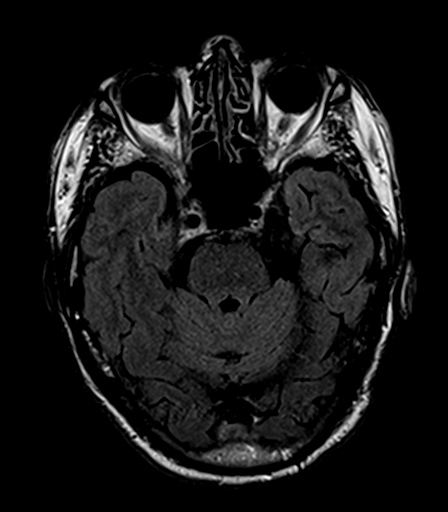
[im 37/55]
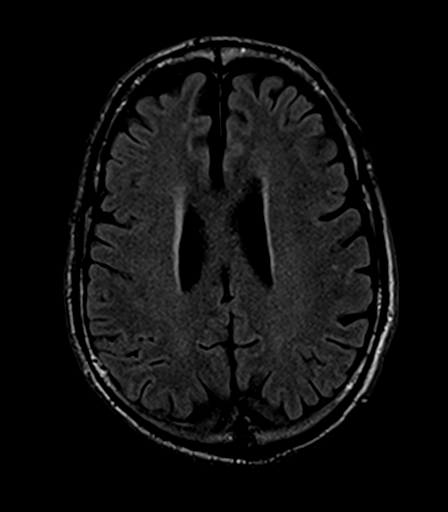
[im 55/55]
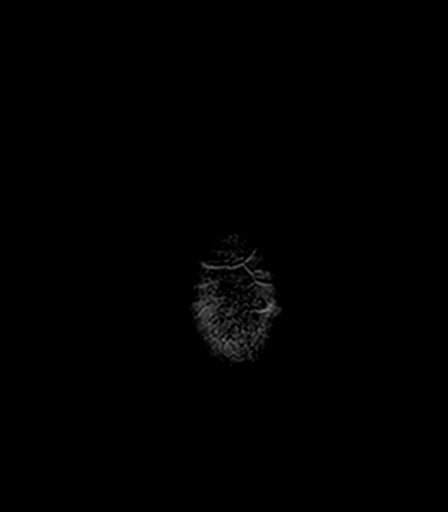

[Series 10: T2 · axial · 5.0mm · 0.72mm/px · z∈[-73,+81]mm · 2 of 24 slices shown (2 of 3)]
[im 1/24]
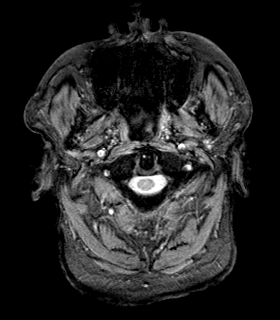
[im 24/24]
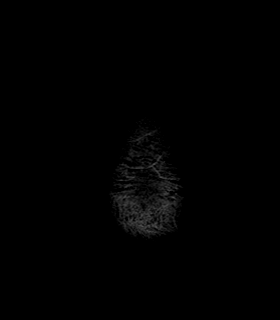

[Series 11: T1 · axial · 1.0mm · 0.94mm/px · z∈[-78,+89]mm · 14 of 176 slices shown (2 of 2)]
[im 1/176]
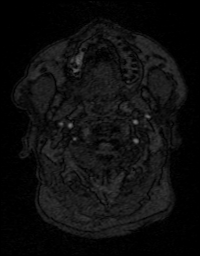
[im 14/176]
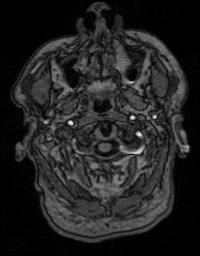
[im 27/176]
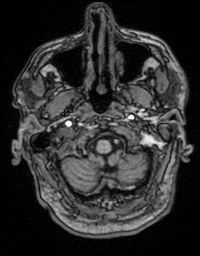
[im 41/176]
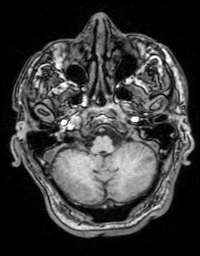
[im 54/176]
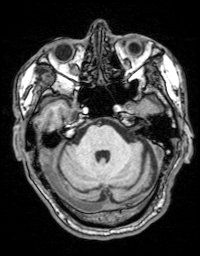
[im 68/176]
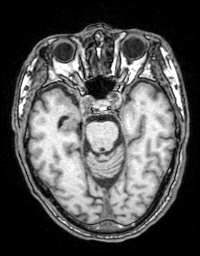
[im 81/176]
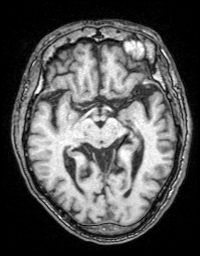
[im 95/176]
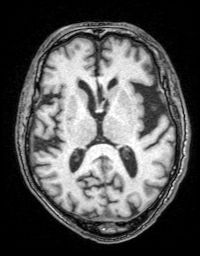
[im 108/176]
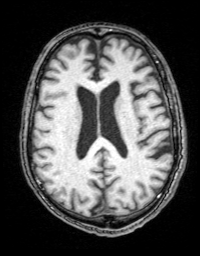
[im 122/176]
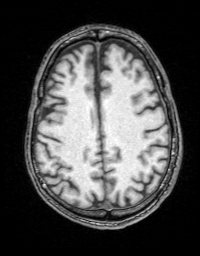
[im 135/176]
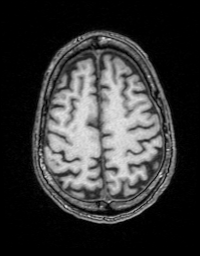
[im 149/176]
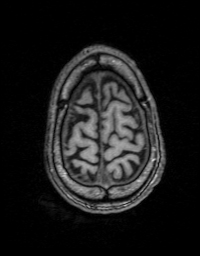
[im 162/176]
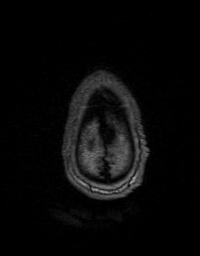
[im 176/176]
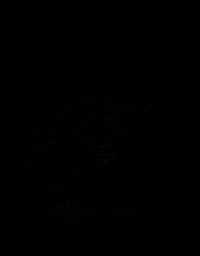

[Series 12: T2 · coronal · 5.0mm · 0.43mm/px · 3 of 32 slices shown (3 of 3)]
[im 1/32]
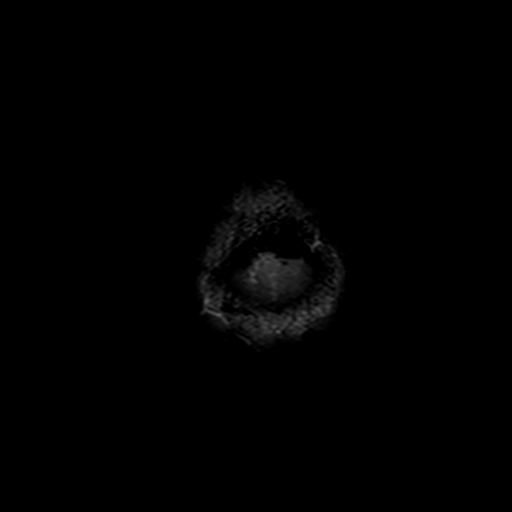
[im 16/32]
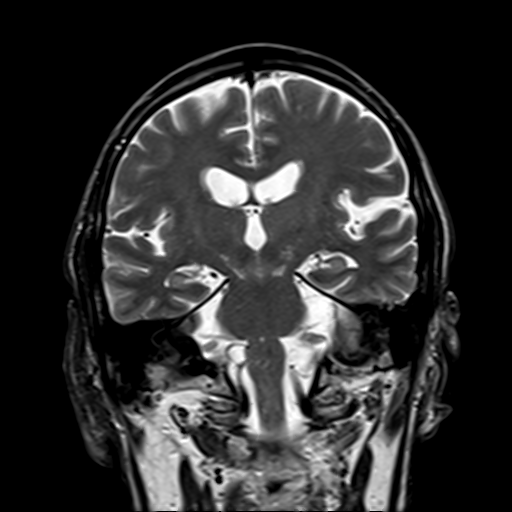
[im 32/32]
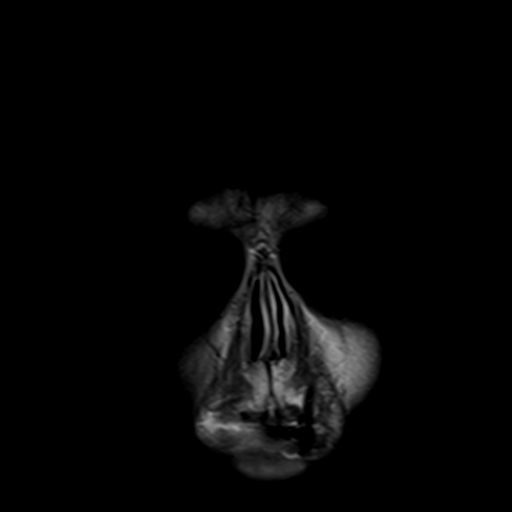

[48 of 48 positions shown; findings below may reference images not displayed]

FINDINGS: Brain:

Mild generalized cerebral atrophy.

Multifocal T2 FLAIR hyperintense signal abnormality within the
cerebral white matter, nonspecific but compatible with minimal
chronic small vessel ischemic disease.

4 mm focus of T1 hyperintense and T2 FLAIR hyperintense signal
abnormality within the midbrain tectum on the right with
corresponding T2* signal loss. (Series 11, image 72) (series 7,
image 13) (series 10, image 10) (series 9, image 22).

There is no acute infarct.

No extra-axial fluid collection.

No midline shift.

Vascular: Maintained flow voids within the proximal large arterial
vessels.

Skull and upper cervical spine: No focal suspicious marrow lesion.
Trace C3-C4 grade 1 anterolisthesis. Incompletely assessed cervical
spondylosis.

Sinuses/Orbits: No mass or acute finding within the imaged orbits.
Mild mucosal thickening within the bilateral ethmoid sinuses.

Impression 1 will be called to the ordering clinician or
representative by the Radiologist Assistant, and communication
documented in the PACS or [REDACTED].
IMPRESSION: 4 mm focus of signal abnormality within the midbrain tectum on the
right, as described. This may reflect a tectal plate lipoma,
cavernoma or small remote hemorrhage. However, post-contrast MR
imaging of the brain is recommended to exclude more worrisome
etiologies (such as a small isolated intracranial metastasis).

Minimal chronic small-vessel ischemic changes within the cerebral
white matter.

Mild generalized cerebral atrophy.

Mild mucosal thickening within the bilateral ethmoid sinuses.

## 2022-02-09 ENCOUNTER — Telehealth: Payer: Self-pay | Admitting: *Deleted

## 2022-02-09 NOTE — Telephone Encounter (Signed)
Scheduled the patient tomorrow 02/10/22 at 3:45.   Patient has been taking tylenol. Told him to seek the nearest ED if things worsen

## 2022-02-09 NOTE — Telephone Encounter (Signed)
Caller Name [place] Phone Number [phone] Patient Name Cesar [name] Patient DOB [birth year] Call Type Message Only Information Provided Reason for Call Request to Schedule Office Appointment Initial Comment Pt is having very bad headaches (has a mass on their brain) and is hoping to speak with someone in the office. Patient request to speak to RN No Disp. Time Disposition Final User 02/08/2022 1:12:00 PM General Information Provided Yes Wisdom, [name]

## 2022-02-10 ENCOUNTER — Ambulatory Visit (INDEPENDENT_AMBULATORY_CARE_PROVIDER_SITE_OTHER): Payer: Medicare Other | Admitting: Family Medicine

## 2022-02-10 ENCOUNTER — Encounter: Payer: Self-pay | Admitting: Family Medicine

## 2022-02-10 ENCOUNTER — Encounter: Payer: Self-pay | Admitting: Physician Assistant

## 2022-02-10 ENCOUNTER — Telehealth: Payer: Self-pay | Admitting: Family Medicine

## 2022-02-10 ENCOUNTER — Ambulatory Visit (INDEPENDENT_AMBULATORY_CARE_PROVIDER_SITE_OTHER): Payer: Medicare Other | Admitting: Physician Assistant

## 2022-02-10 VITALS — BP 130/78 | HR 70 | Ht 72.0 in | Wt 199.0 lb

## 2022-02-10 VITALS — BP 126/82 | HR 74 | Temp 97.5°F | Ht 72.0 in | Wt 199.4 lb

## 2022-02-10 DIAGNOSIS — R1310 Dysphagia, unspecified: Secondary | ICD-10-CM | POA: Diagnosis not present

## 2022-02-10 DIAGNOSIS — Z86010 Personal history of colonic polyps: Secondary | ICD-10-CM | POA: Diagnosis not present

## 2022-02-10 DIAGNOSIS — R519 Headache, unspecified: Secondary | ICD-10-CM | POA: Diagnosis not present

## 2022-02-10 DIAGNOSIS — K219 Gastro-esophageal reflux disease without esophagitis: Secondary | ICD-10-CM

## 2022-02-10 MED ORDER — TOPIRAMATE 25 MG PO TABS: ORAL_TABLET | ORAL | 1 refills | Status: DC

## 2022-02-10 NOTE — Progress Notes (Signed)
Subjective:    Patient ID: Cesar [name], male    DOB: 09/15/1945, 76 y.o.   MRN: [id number]  HPI Cesar [name] is a pleasant 76 year old white male, new to GI today referred by Dr. Arva ChafeNicholas Wendling, DO with complaints of dysphagia. Patient says his current symptoms have been present over the past 3 to 4 months and that he has noticed issues primarily with meat and bread.  He has adjusted his diet and is eating softer foods and primarily vegetables and is not having difficulty with these foods.  No difficulty with liquids.  He says he was getting a very frequent sensation of food "hanging up" when consuming meat and bread previously.  He was having to try to push the food down with a lot of liquids.  He has not had any episodes requiring regurgitation. He does have history of chronic GERD and is maintained on low-dose omeprazole 20 mg p.o. daily long-term. He says he has history of previous esophageal stricture and did have an EGD with esophageal dilation at some point in the past, perhaps by Dr. Loman Chromanhoton. Review of care everywhere shows that he did have EGD and colonoscopy by Dr. Loman Chromanhoton in March 2017.  EGD at that time did not show any evidence of stricture or esophageal ring, no evidence of eosinophilic esophagitis.  He did have some random biopsies taken of the stomach, did not undergo esophageal dilation. Biopsy showed fundic gland polyps, no H. pylori and negative small bowel biopsies. He also had colonoscopy at that same setting with finding of one 2 mm sessile polyp in the transverse colon and moderate-sized internal hemorrhoids.  Path on the polyp consistent with a tubular adenoma.  Patient has been followed through the [facility] but does not believe he has had any GI evaluation since 2017. He has history of PTSD, chronic anxiety, cervical stenosis, coronary artery disease. He very recently had MRI of the brain due to complaints of some dizziness and headaches.  This showed changes of chronic small  vessel ischemia and a 4 mm focus of signal abnormality in the mid brain on the right rule out tectal plate lipoma/cavernoma small remote hemorrhage however postcontrast MR imaging recommended to exclude isolated metastases He is being scheduled for further imaging.  I do not see records of his cardiac care, but note that he is on midodrine, no blood thinners.    Review of Systems. Pertinent positive and negative review of systems were noted in the above HPI section.  All other review of systems was otherwise negative.   Outpatient Encounter Medications as of 02/10/2022  Medication Sig   aspirin 81 MG chewable tablet Chew by mouth daily.   Calcium-Vitamin D 600-200 MG-UNIT tablet Take by mouth.   lovastatin (MEVACOR) 10 MG tablet Take 1 tablet (10 mg total) by mouth at bedtime.   metoprolol tartrate (LOPRESSOR) 25 MG tablet TAKE 1/2 TABLET BY MOUTH TWICE DAILY   midodrine (PROAMATINE) 5 MG tablet Take 1 tablet (5 mg total) by mouth 2 (two) times daily with a meal.   nitroGLYCERIN (NITROSTAT) 0.4 MG SL tablet Place 1 tablet (0.4 mg total) under the tongue every 5 (five) minutes as needed for chest pain.   omeprazole (PRILOSEC) 20 MG capsule Take 1 capsule (20 mg total) by mouth daily.   ondansetron (ZOFRAN-ODT) 4 MG disintegrating tablet Take 1 tablet (4 mg total) by mouth every 8 (eight) hours as needed for nausea or vomiting.   polyethylene glycol (MIRALAX / GLYCOLAX) packet 17 g by  Does not apply route.   prochlorperazine (COMPAZINE) 5 MG tablet Take 1 tablet (5 mg total) by mouth every 6 (six) hours as needed for nausea or vomiting.   sertraline (ZOLOFT) 50 MG tablet TAKE ONE TABLET BY MOUTH IN THE MORNING FOR PTSD, ANXIETY, AND DEPRESSION-- DISCONTINUE FLUOXETINE (PROZAC)   triamcinolone cream (KENALOG) 0.1 % Apply to affected areas twice daily as needed   Zoster Vaccine Adjuvanted [facility]) injection Inject into the muscle.   [DISCONTINUED] FLUoxetine (PROZAC) 10 MG tablet Take 1 tab 3  times daily.   [DISCONTINUED] COVID-19 mRNA bivalent vaccine, Pfizer, injection Inject into the muscle. (Patient not taking: Reported on 02/10/2022)   [DISCONTINUED] COVID-19 mRNA Vac-TriS, Pfizer, SUSP injection Inject into the muscle. (Patient not taking: Reported on 02/10/2022)   [DISCONTINUED] diazepam (VALIUM) 10 MG tablet Take 1 tab 45 min prior to MRI, repeat 1/2 tab in 20-30 min if no improvement. (Patient not taking: Reported on 02/10/2022)   [DISCONTINUED] influenza vaccine adjuvanted (FLUAD) 0.5 ML injection Inject into the muscle. (Patient not taking: Reported on 02/10/2022)   [DISCONTINUED] ondansetron (ZOFRAN) 4 MG tablet Take 1 tablet (4 mg total) by mouth every 6 (six) hours. (Patient not taking: Reported on 02/10/2022)   No facility-administered encounter medications on file as of 02/10/2022.   Allergies  Allergen Reactions   Formaldehyde Itching and Rash    SOLN   Latex Itching   Sulfa Antibiotics Itching, Nausea And Vomiting and Rash    15 POWD SOLN   Butorphanol Other (See Comments)    Drowsiness Drowsy Drowsiness   Celecoxib Rash    Flushing Flushing   Hydrocodone-Acetaminophen Other (See Comments) and Nausea And Vomiting   Metformin Other (See Comments)    Unknown   Oxycodone-Acetaminophen Nausea And Vomiting   Povidone Iodine Rash   Metformin Hcl Other (See Comments)    *blurred vision, 11/06/2009   Simvastatin Other (See Comments)    Myalgias   Tamsulosin Other (See Comments)    Dyspnea   Nifedipine Nausea Only   Nitroglycerin Rash    Nitroglycerin patch developed rash and blisters from adhesive and latex   Povidone-Iodine Rash   Quaternium-15 Rash   Rofecoxib Nausea Only    Flushing   Tape Itching and Rash   Patient Active Problem List   Diagnosis Date Noted   Hx of adenomatous colonic polyps 02/10/2022   PTSD (post-traumatic stress disorder) 07/08/2020   Chronic bilateral low back pain without sciatica 07/08/2020   GAD (generalized anxiety disorder)  07/08/2020   Papular urticaria 10/26/2016   Dermographia 10/26/2016   Gastroesophageal reflux disease without esophagitis 09/25/2016   Angina at rest [facility]) 09/25/2016   Other allergic rhinitis 09/25/2016   Allergic urticaria 09/25/2016   Social History   Socioeconomic History   Marital status: Widowed    Spouse name: Not on file   Number of children: Not on file   Years of education: Not on file   Highest education level: Not on file  Occupational History   Not on file  Tobacco Use   Smoking status: Never   Smokeless tobacco: Never  Vaping Use   Vaping Use: Never used  Substance and Sexual Activity   Alcohol use: No   Drug use: No   Sexual activity: Not on file  Other Topics Concern   Not on file  Social History Narrative   Not on file   Social Determinants of Health   Financial Resource Strain: Low Risk    Difficulty of Paying Living Expenses:  Not hard at all  Food Insecurity: No Food Insecurity   Worried About Programme researcher, broadcasting/film/video in the Last Year: Never true   Ran Out of Food in the Last Year: Never true  Transportation Needs: No Transportation Needs   Lack of Transportation (Medical): No   Lack of Transportation (Non-Medical): No  Physical Activity: Sufficiently Active   Days of Exercise per Week: 5 days   Minutes of Exercise per Session: 30 min  Stress: No Stress Concern Present   Feeling of Stress : Not at all  Social Connections: Moderately Isolated   Frequency of Communication with Friends and Family: Three times a week   Frequency of Social Gatherings with Friends and Family: Twice a week   Attends Religious Services: Never   Database administrator or Organizations: Yes   Attends Engineer, structural: More than 4 times per year   Marital Status: Widowed  Intimate Partner Violence: Not At Risk   Fear of Current or Ex-Partner: No   Emotionally Abused: No   Physically Abused: No   Sexually Abused: No    Mr. Wiebelhaus's family history is not on  file.      Objective:    Vitals:   02/10/22 1406  BP: 130/78  Pulse: 70    Physical Exam Well-developed well-nourished  eld WM  in no acute distress.  Very pleasant, height, Weight,199  BMI 27.04 ambulates with a cane  HEENT; nontraumatic normocephalic, EOMI, PE R LA, sclera anicteric. Oropharynx; not examined today Neck; supple, no JVD Cardiovascular; regular rate and rhythm with S1-S2, no murmur rub or gallop Pulmonary; Clear bilaterally Abdomen; soft, nontender, nondistended, no palpable mass or hepatosplenomegaly, bowel sounds are active Rectal; not done today Skin; benign exam, no jaundice rash or appreciable lesions Extremities; no clubbing cyanosis or edema skin warm and dry Neuro/Psych; alert and oriented x4, grossly nonfocal mood and affect appropriate        Assessment & Plan:   #41 76 year old white male with history of chronic GERD, with complaints of solid food dysphagia x3 to 4 months. Patient did have EGD 2017 with complaints of dysphagia/Dr. Loman Chroman in [place] esophageal stricture or ring noted, no dilation done.  Rule out esophageal stricture, rule out esophageal dysmotility  #2 history of adenomatous colon polyps-last colonoscopy 2017 Dr. Patrina Levering one 2 mm polyp removed. By current guidelines would anticipate follow-up colonoscopy indication at 7-year interval.  #3 coronary artery disease-review of care everywhere, cardiology note October 2019/Dr. Heron Nay [facility]-nonobstructive coronary artery disease  #4 question history of hypotension-on midodrine #5 PTSD #6 chronic anxiety #7 cervical stenosis  Plan; Will start with barium swallow with a tablet, then if stricture noted patient can be scheduled for EGD with esophageal dilation with Dr. Barron Alvine  For now continue omeprazole 20 mg p.o. every morning Plan for follow-up colonoscopy 2024.     Alliya Marcon S Kadir Azucena PA-C 02/10/2022   Cc: Sharlene Dory*

## 2022-02-10 NOTE — Patient Instructions (Signed)
If you are age 76 or older, your body mass index should be between 23-30. Your Body mass index is 26.99 kg/m. If this is out of the aforementioned range listed, please consider follow up with your Primary Care Provider. ________________________________________________________  The  GI providers would like to encourage you to use [facility] to communicate with providers for non-urgent requests or questions.  Due to long hold times on the telephone, sending your provider a message by [facility] may be a faster and more efficient way to get a response.  Please allow 48 business hours for a response.  Please remember that this is for non-urgent requests.  _______________________________________________________  Cesar [name] have been scheduled for a Barium Esophogram at [facility] (1st floor of the hospital) on 02/18/2022 at 11:00 am. Please arrive 15 minutes prior to your appointment for registration. Make certain not to have anything to eat or drink 4 hours prior to your test. If you need to reschedule for any reason, please contact radiology at [phone] to do so. __________________________________________________________________ A barium swallow is an examination that concentrates on views of the esophagus. This tends to be a double contrast exam (barium and two liquids which, when combined, create a gas to distend the wall of the oesophagus) or single contrast (non-ionic iodine based). The study is usually tailored to your symptoms so a good history is essential. Attention is paid during the study to the form, structure and configuration of the esophagus, looking for functional disorders (such as aspiration, dysphagia, achalasia, motility and reflux) EXAMINATION You may be asked to change into a gown, depending on the type of swallow being performed. A radiologist and radiographer will perform the procedure. The radiologist will advise you of the type of contrast selected for your procedure and  direct you during the exam. You will be asked to stand, sit or lie in several different positions and to hold a small amount of fluid in your mouth before being asked to swallow while the imaging is performed .In some instances you may be asked to swallow barium coated marshmallows to assess the motility of a solid food bolus. The exam can be recorded as a digital or video fluoroscopy procedure. POST PROCEDURE It will take 1-2 days for the barium to pass through your system. To facilitate this, it is important, unless otherwise directed, to increase your fluids for the next 24-48hrs and to resume your normal diet.  This test typically takes about 30 minutes to perform. __________________________________________________________________________________  Consider using Omeprazole 20 mg in the morning before breakfast.  Follow up pending the results of your Colonoscopy.  Thank you for entrusting me with your care and choosing [facility].  Amy Esterwood, PA-C

## 2022-02-10 NOTE — Patient Instructions (Addendum)
Call [phone] to schedule your next MRI.   Let me know if there are cost issues.   Let us know if you need anything.

## 2022-02-10 NOTE — Telephone Encounter (Signed)
Initiate PA for Topiramate 25 mg KEY:  [id number] Waiting response from Cover my meds.

## 2022-02-10 NOTE — Progress Notes (Signed)
Chief Complaint  Patient presents with   Headache    Cesar [name] is a 76 y.o. male here for evaluation of both headache.  Treatment: Tylenol Located over top of head Has been going on for 2 mo Aura: No, constant Palliation: none Provocation: none Associated symptoms: R jaw pain, R ear pain Denies: Gum chewing, injury, nausea, vomiting, sonophobia, photophobia, ataxia Currently with headache? Yes Failed therapies: Tylenol; has not taken prophylactic med MRI ordered for incidental finding.   BP 126/82   Pulse 74   Temp (!) 97.5 F (36.4 C) (Oral)   Ht 6' (1.829 m)   Wt 199 lb 6 oz (90.4 kg)   SpO2 96%   BMI 27.04 kg/m  General: awake, alert, appearing stated age Eyes: PERRLA, EOMi Lungs: no accessory muscle use Neuro: CN 2-12 intact, no cerebellar signs, DTR's equal and symmetric in UEs, 0/4 patellar reflex on R, 2/4 patellar reflex on L, no clonus MSK: 5/5 strength throughout, no TTP over posterior cervical triangle or paraspinal cervical musculature Psych: Age appropriate judgment and insight, mood and affect normal  Chronic daily headache - Plan: topiramate (TOPAMAX) 25 MG tablet  Chronic, not controlled.  He is on serotonin affecting medications through the psychiatry team.  We will start Topamax 25 mg daily for 3 days, and then increase by 25 mg every 3 days until he is taking 50 mg twice daily.  He has an MRI which he needs to have scheduled.  Can take Excedrin as needed. Follow up in 4 weeks. The patient voiced understanding and agreement to the plan.  Crosby Oyster [place], [place] 4:03 PM 02/10/22

## 2022-02-11 ENCOUNTER — Telehealth: Payer: Self-pay | Admitting: Family Medicine

## 2022-02-11 NOTE — Telephone Encounter (Signed)
Received denial for medication

## 2022-02-11 NOTE — Telephone Encounter (Signed)
Called informed the patient of PCP instructions. 

## 2022-02-11 NOTE — Telephone Encounter (Signed)
Received denial. Alternative magnesium?

## 2022-02-11 NOTE — Telephone Encounter (Signed)
Patient would like a call back discuss his topiramate (TOPAMAX) 25 MG tablet medication. He states his insurance won't cover it and he wants to know if there's another one he can take or if he just needs to stick with the excedrin that seems to be working. Please advise.

## 2022-02-11 NOTE — Telephone Encounter (Signed)
I have started the PA for this but have not received a response yet

## 2022-02-14 ENCOUNTER — Ambulatory Visit (HOSPITAL_BASED_OUTPATIENT_CLINIC_OR_DEPARTMENT_OTHER)
Admission: RE | Admit: 2022-02-14 | Discharge: 2022-02-14 | Disposition: A | Discharge status: Home / Self Care | Payer: Medicare Other | Source: Ambulatory Visit | Attending: Family Medicine | Admitting: Family Medicine

## 2022-02-14 DIAGNOSIS — R441 Visual hallucinations: Secondary | ICD-10-CM | POA: Insufficient documentation

## 2022-02-14 IMAGING — MR MR HEAD WO/W CM
10 series · 48 of 48 positions shown · IV contrast (gadavist)
Comparison: Brain MRI [DATE]

CLINICAL DATA: Abnormality on prior brain MRI.

EXAM:
MRI HEAD WITHOUT AND WITH CONTRAST
TECHNIQUE: Multiplanar, multiecho pulse sequences of the brain and surrounding
structures were obtained without and with intravenous contrast.
CONTRAST:  7.5mL GADAVIST GADOBUTROL 1 MMOL/ML IV SOLN

[Series 2: T1 · sagittal · 5.0mm · 0.72mm/px · 1 of 27 slices shown (1 of 2)]
[im 1/27]
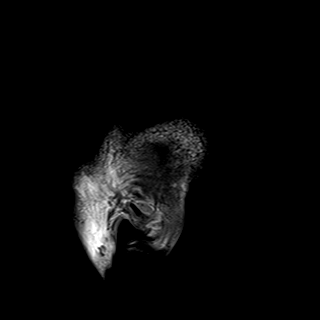

[Series 3: DWI · axial · 3.0mm · 1.88mm/px · z∈[-51,+100]mm · 9 of 96 slices shown (1 of 2)]
[im 1/96]
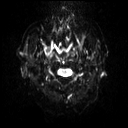
[im 12/96]
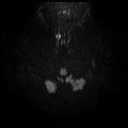
[im 24/96]
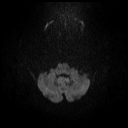
[im 36/96]
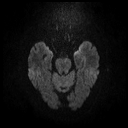
[im 48/96]
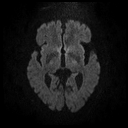
[im 60/96]
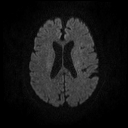
[im 72/96]
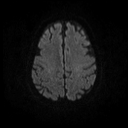
[im 84/96]
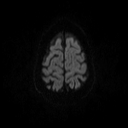
[im 96/96]
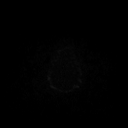

[Series 4: DWI · axial · 3.0mm · 1.88mm/px · z∈[-51,+100]mm · 4 of 44 slices shown (2 of 2)]
[im 1/44]
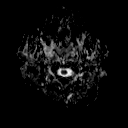
[im 15/44]
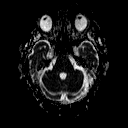
[im 29/44]
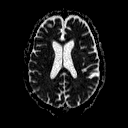
[im 44/44]
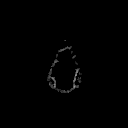

[Series 5: T2 · axial · 5.0mm · 0.69mm/px · z∈[-53,+98]mm · 3 of 32 slices shown (1 of 3)]
[im 1/32]
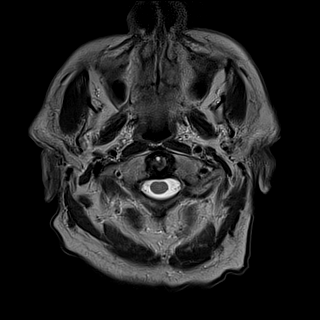
[im 16/32]
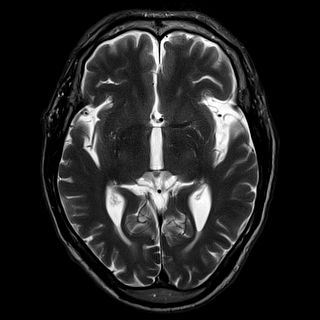
[im 32/32]
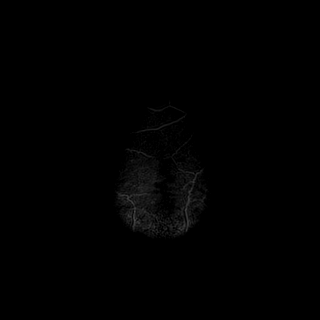

[Series 6: T2 · axial · 5.0mm · 0.69mm/px · z∈[-53,+98]mm · 3 of 32 slices shown (2 of 3)]
[im 1/32]
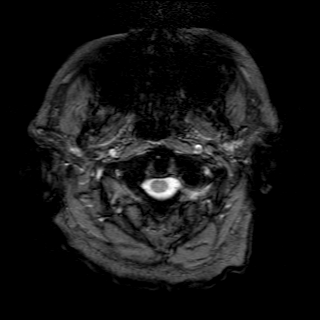
[im 16/32]
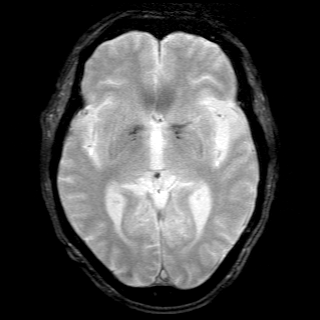
[im 32/32]
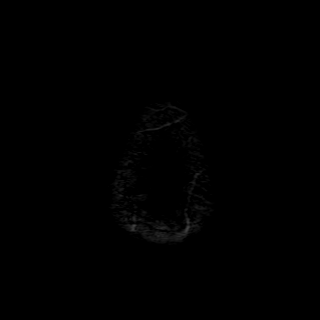

[Series 7: FLAIR · axial · 3.0mm · 0.43mm/px · z∈[-57,+103]mm · 4 of 42 slices shown]
[im 1/42]
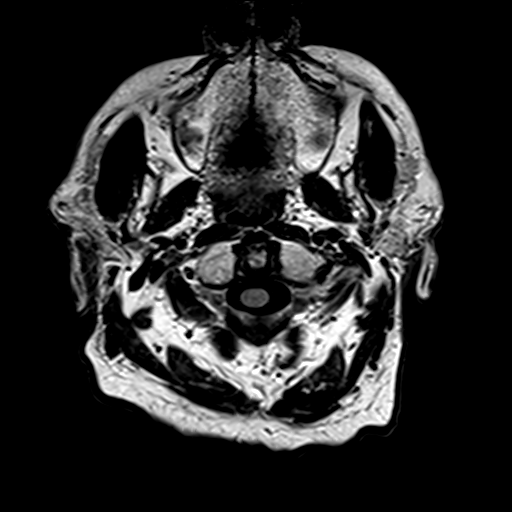
[im 14/42]
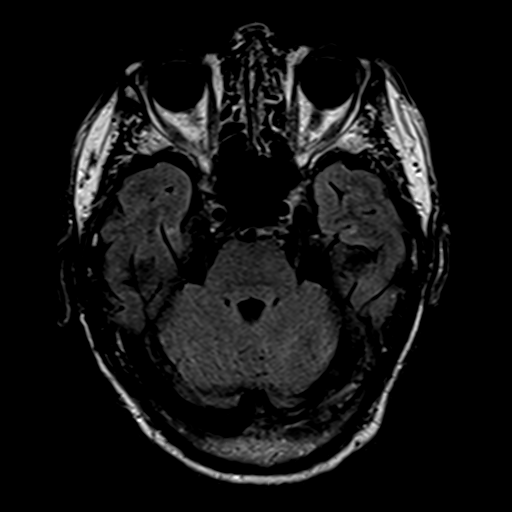
[im 28/42]
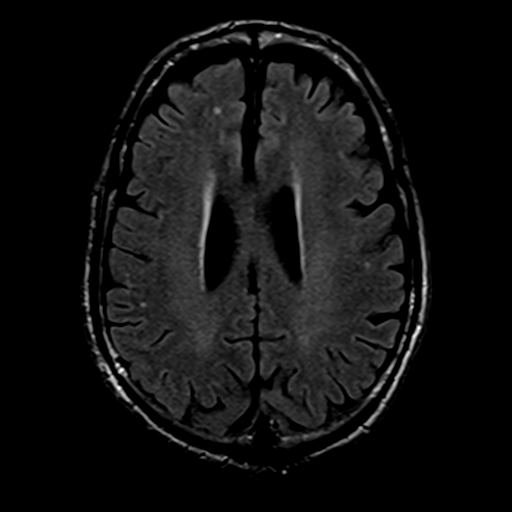
[im 42/42]
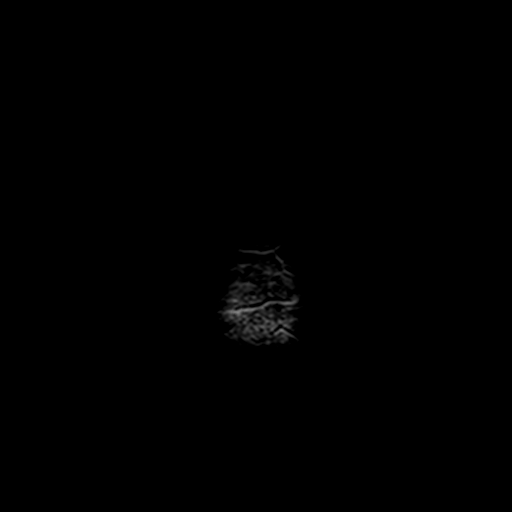

[Series 8: t1_3d_tra · axial · 2.0mm · 0.94mm/px · z∈[-65,+121]mm · 9 of 96 slices shown]
[im 1/96]
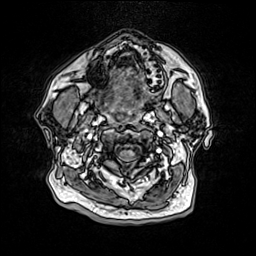
[im 12/96]
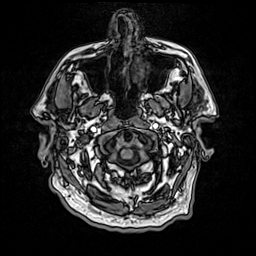
[im 24/96]
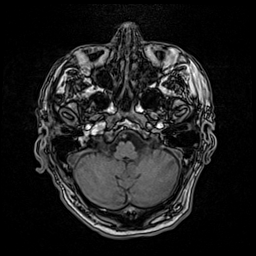
[im 36/96]
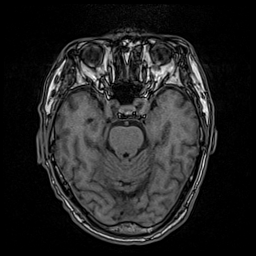
[im 48/96]
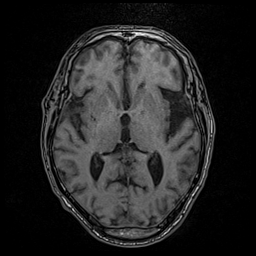
[im 60/96]
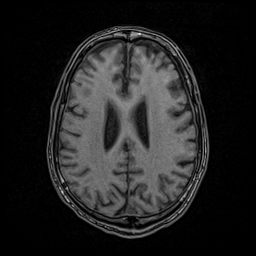
[im 72/96]
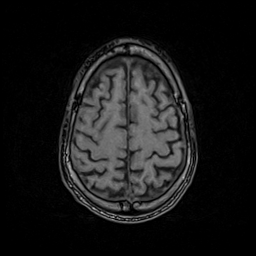
[im 84/96]
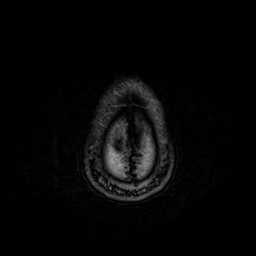
[im 96/96]
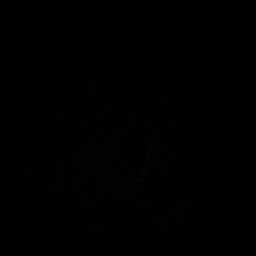

[Series 9: T2 · coronal · 5.0mm · 0.69mm/px · 3 of 32 slices shown (3 of 3)]
[im 1/32]
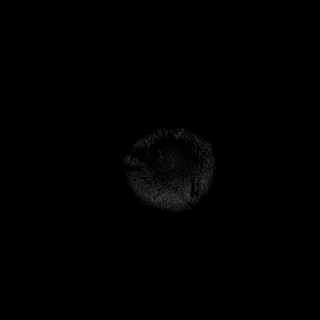
[im 16/32]
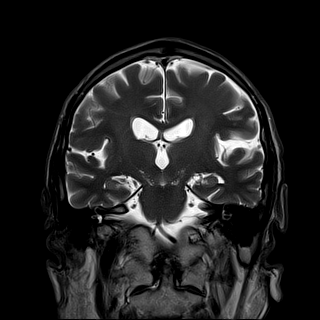
[im 32/32]
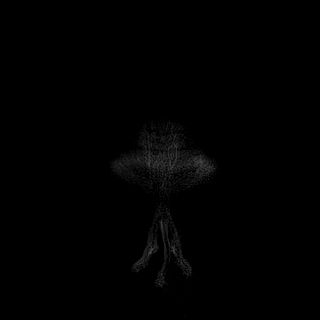

[Series 10: t1_3d_tra +c · axial · 2.0mm · 0.94mm/px · z∈[-65,+121]mm · 9 of 96 slices shown]
[im 1/96]
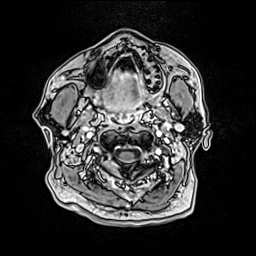
[im 12/96]
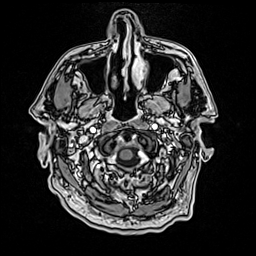
[im 24/96]
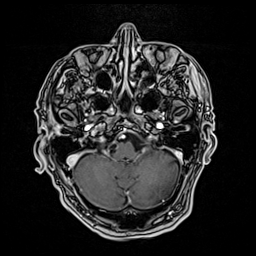
[im 36/96]
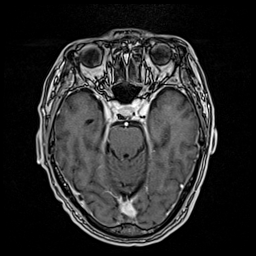
[im 48/96]
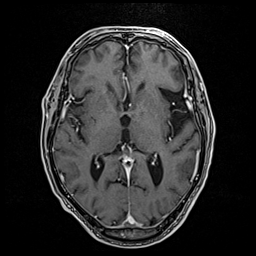
[im 60/96]
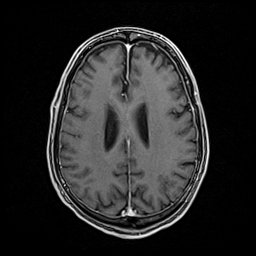
[im 72/96]
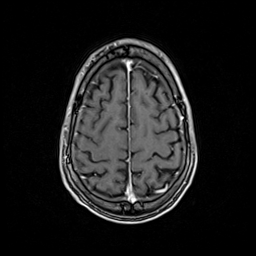
[im 84/96]
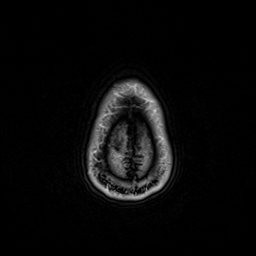
[im 96/96]
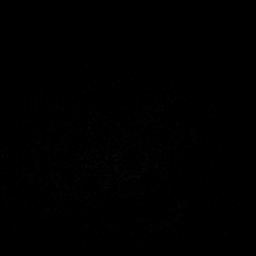

[Series 11: T1 · coronal · 5.0mm · 0.69mm/px · 3 of 32 slices shown (2 of 2)]
[im 1/32]
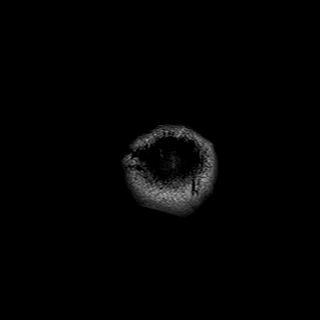
[im 16/32]
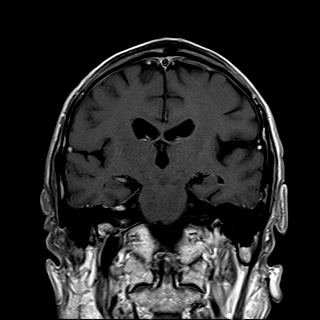
[im 32/32]
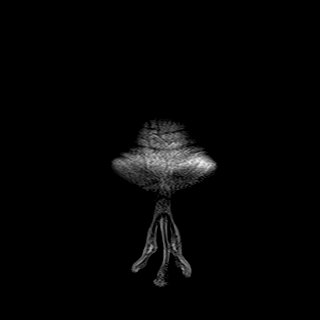

[48 of 48 positions shown; findings below may reference images not displayed]

FINDINGS: Brain: There is no evidence of acute intracranial hemorrhage,
extra-axial fluid collection, or acute infarct.

Background parenchymal volume is normal. The ventricles are normal
in size. There are scattered small foci of FLAIR signal abnormality
in the subcortical and periventricular white matter likely
reflecting sequela of minimal chronic white matter microangiopathy.

A small focus of FLAIR signal abnormality with associated
susceptibility artifact is again seen along the right aspect of the
tectum (7-17, 6-13). There is T1 hyperintensity on the sagittal T1
precontrast images (2-13). There is no appreciable postcontrast
enhancement.

There is no other abnormal enhancement. There is no other evidence
of solid mass lesion. There is no mass effect or midline shift.

Vascular: Normal flow voids.

Skull and upper cervical spine: Normal marrow signal.

Sinuses/Orbits: The imaged paranasal sinuses are clear. The globes
and orbits are unremarkable.

Other: None.
IMPRESSION: No appreciable postcontrast enhancement is seen in the area of
signal abnormality in the right tectum. This lesion is favored to be
of benign etiology with differential again including a tectal
lipoma, small cavernoma, or small focus of remote blood products.
Consider 6 month follow up to ensure stability.

## 2022-02-14 MED ORDER — GADOBUTROL 1 MMOL/ML IV SOLN
7.5000 mL | Freq: Once | INTRAVENOUS | Status: AC | PRN
  Administered 2022-02-14: 7.5 mL via INTRAVENOUS

## 2022-02-17 ENCOUNTER — Telehealth: Payer: Self-pay | Admitting: Family Medicine

## 2022-02-17 NOTE — Telephone Encounter (Signed)
Pt called and stated he has been taking some medication for his headaches and has been helping a little but is now having new sxs. He stated from his temple all the way to his leg on the right side was slightly numb and tingly. He stated his leg was mostly numb and his head pain felt like a toothache. Warm transferred to triage and he is speaking with a nurse now.

## 2022-02-17 NOTE — Telephone Encounter (Signed)
Patient scheduled appointment for 02/18/2022 at 2:30.

## 2022-02-18 ENCOUNTER — Ambulatory Visit (HOSPITAL_COMMUNITY)
Admission: RE | Admit: 2022-02-18 | Discharge: 2022-02-18 | Disposition: A | Discharge status: Home / Self Care | Payer: Medicare Other | Source: Ambulatory Visit | Attending: Physician Assistant | Admitting: Physician Assistant

## 2022-02-18 ENCOUNTER — Encounter: Payer: Self-pay | Admitting: Family Medicine

## 2022-02-18 ENCOUNTER — Ambulatory Visit (INDEPENDENT_AMBULATORY_CARE_PROVIDER_SITE_OTHER): Payer: Medicare Other | Admitting: Family Medicine

## 2022-02-18 VITALS — BP 137/70 | HR 74 | Temp 97.7°F | Ht 72.0 in | Wt 202.1 lb

## 2022-02-18 DIAGNOSIS — R519 Headache, unspecified: Secondary | ICD-10-CM | POA: Diagnosis not present

## 2022-02-18 DIAGNOSIS — R1310 Dysphagia, unspecified: Secondary | ICD-10-CM | POA: Insufficient documentation

## 2022-02-18 DIAGNOSIS — L570 Actinic keratosis: Secondary | ICD-10-CM

## 2022-02-18 DIAGNOSIS — K219 Gastro-esophageal reflux disease without esophagitis: Secondary | ICD-10-CM | POA: Insufficient documentation

## 2022-02-18 IMAGING — RF DG ESOPHAGUS
12 of 13 series · 14 of 24 positions shown · non-contrast
Comparison: None Available.

CLINICAL DATA: Solid food dysphagia.

EXAM:
ESOPHAGUS/BARIUM SWALLOW/TABLET STUDY
TECHNIQUE: Combined double and single contrast examination was performed using
effervescent crystals, high-density barium, and thin liquid barium.
This exam was performed by HJROSLAN, NP, and was supervised and
interpreted by HJROSLAN.
FLUOROSCOPY:
Radiation Exposure Index (as provided by the fluoroscopic device):
75.10 mGy

[Series 1: cp_standard · 1 of 63 frames shown (1 of 12)]
[frame 5/63]
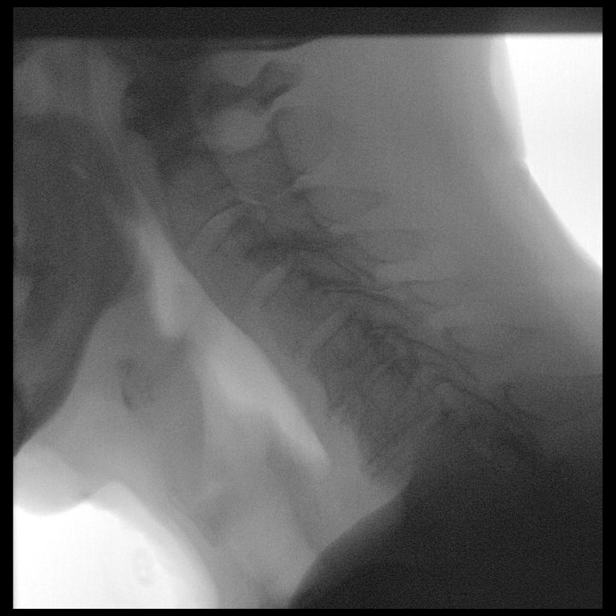

[Series 2: cp_standard · 1 of 205 frames shown (2 of 12)]
[frame 31/205]
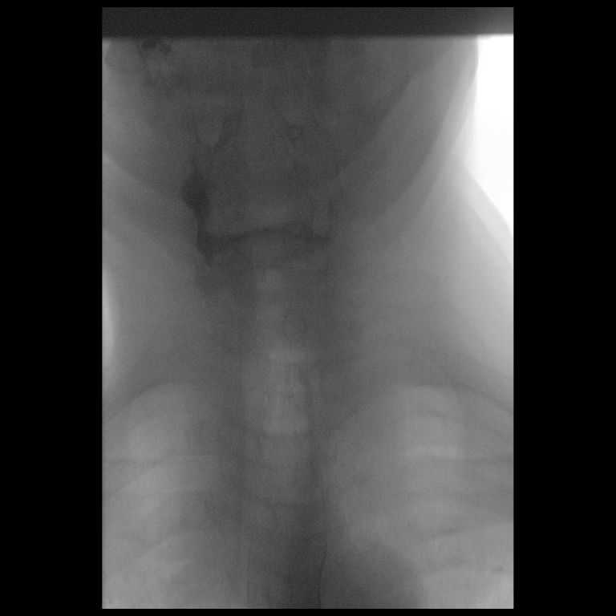

[Series 3: cp_standard · 1 of 89 frames shown (3 of 12)]
[frame 45/89]
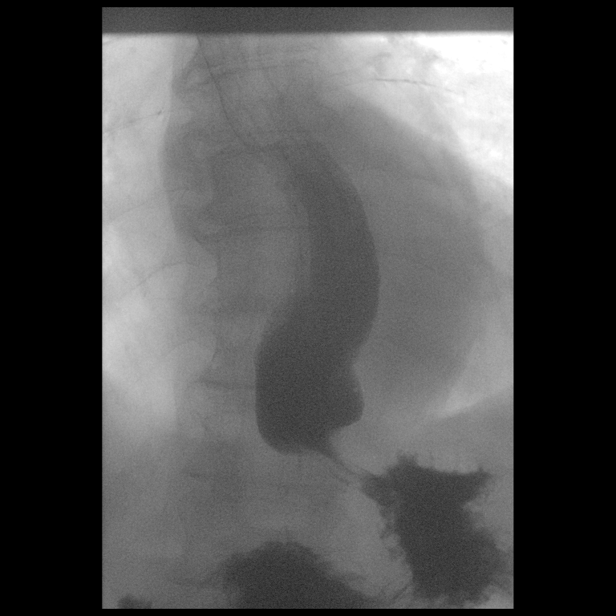

[Series 4: cp_standard · 1 of 319 frames shown (4 of 12)]
[frame 128/319]
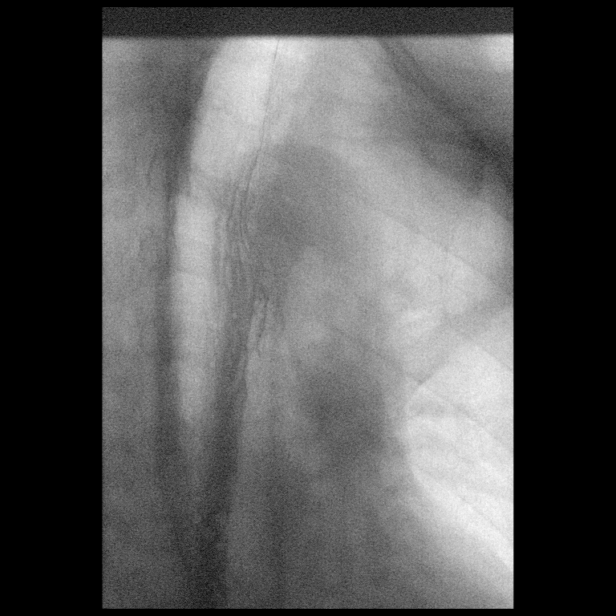

[Series 5: cp_standard · 1 of 294 frames shown (5 of 12)]
[frame 43/294]
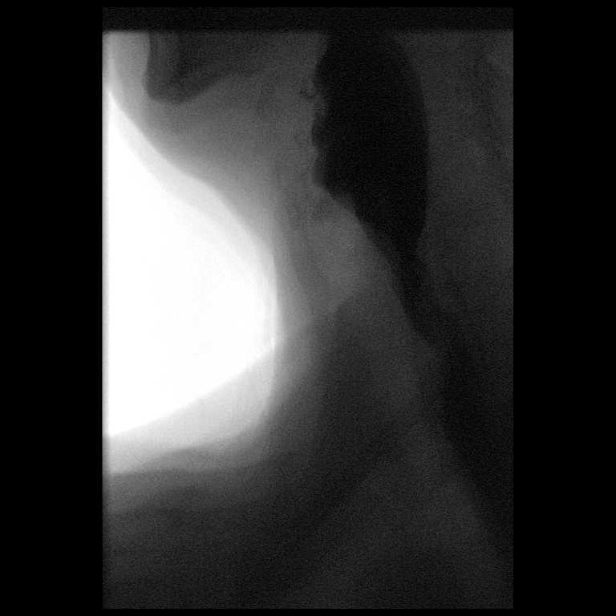

[Series 6: cp_standard · 1 of 304 frames shown (6 of 12)]
[frame 1/304]
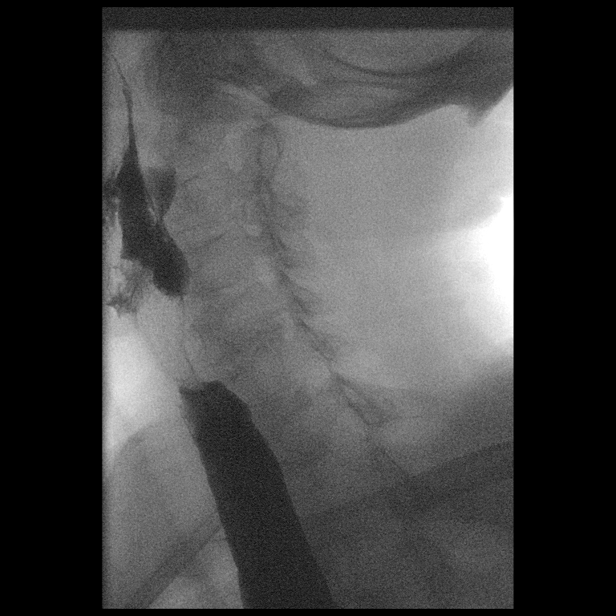

[Series 7: cp_standard · 2 of 181 frames shown (7 of 12)]
[frame 20/181]
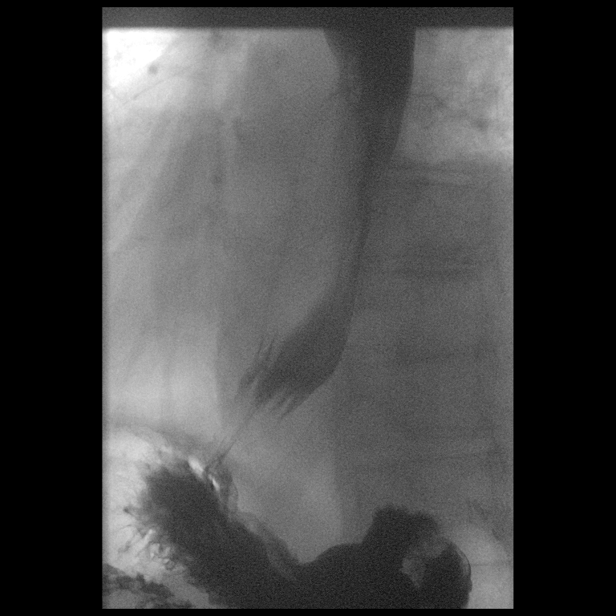
[frame 154/181]
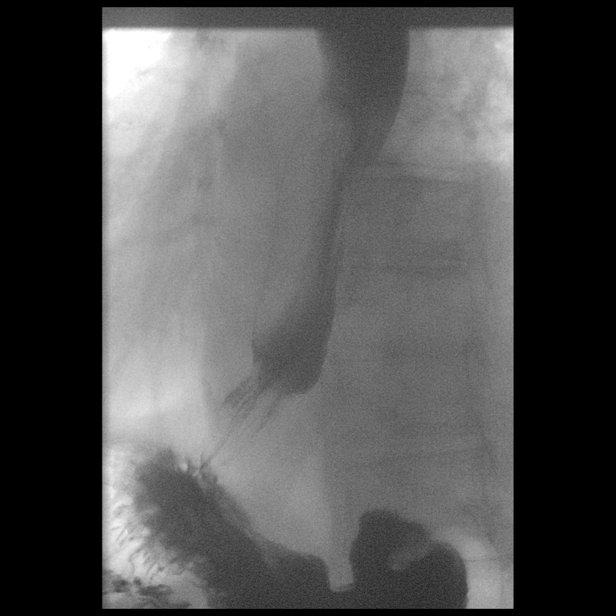

[Series 8: cp_standard · 1 of 105 frames shown (8 of 12)]
[frame 90/105]
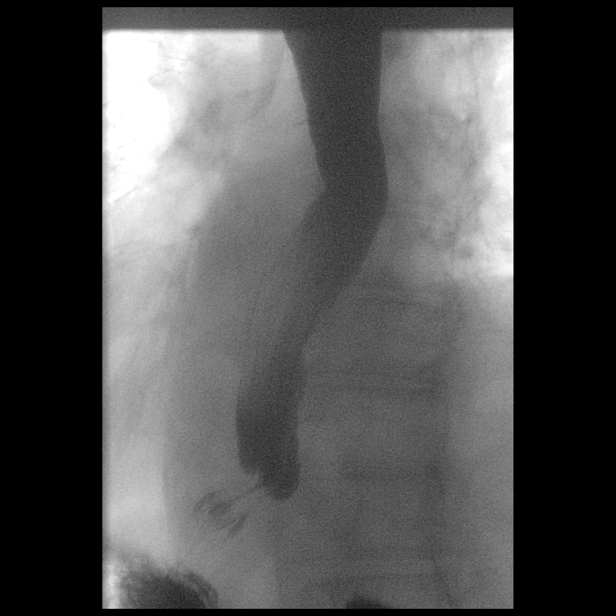

[Series 9: cp_standard · 1 of 217 frames shown (9 of 12)]
[frame 185/217]
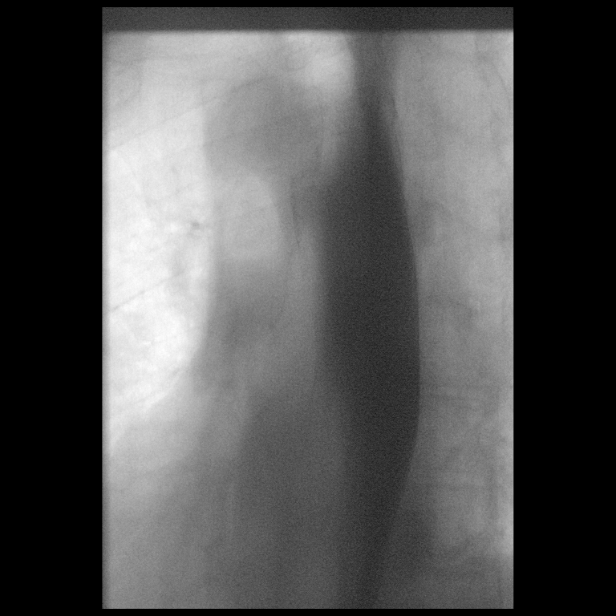

[Series 11: cp_standard · 2 of 194 frames shown (10 of 12)]
[frame 1/194]
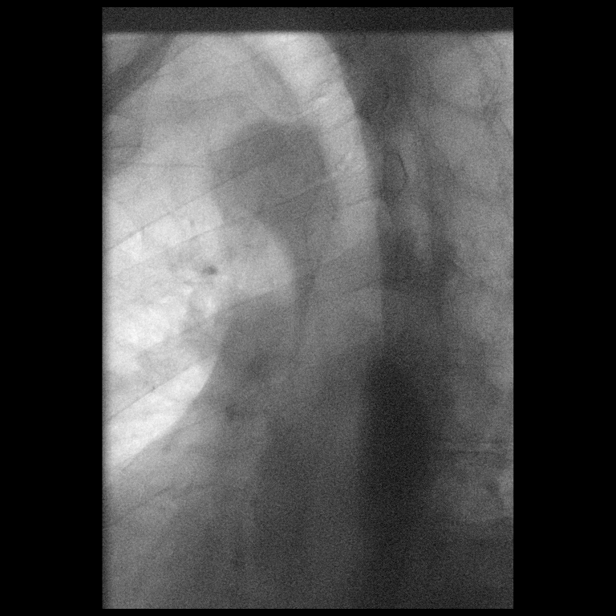
[frame 98/194]
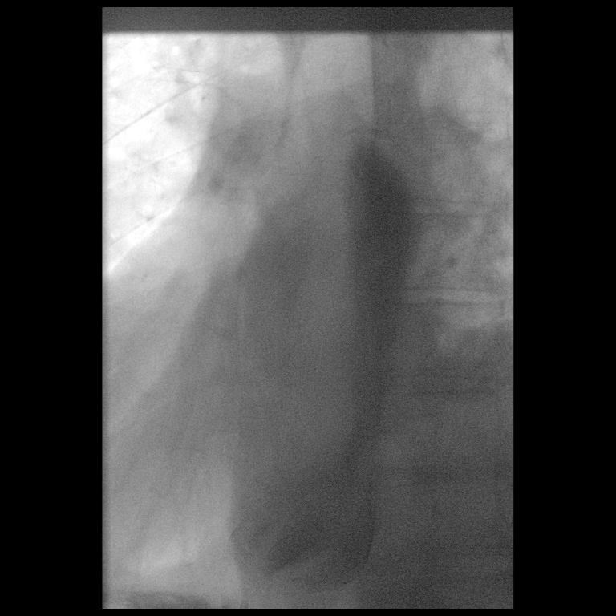

[Series 12: cp_standard · 1 of 128 frames shown (11 of 12)]
[frame 109/128]
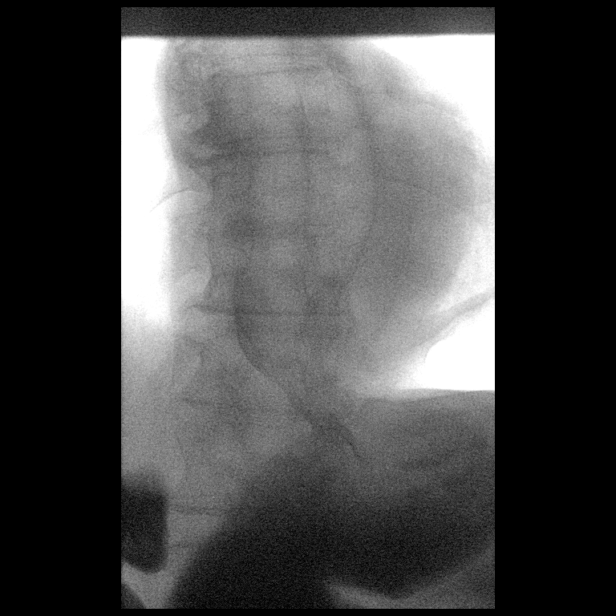

[Series 13: cp_standard · 1 of 48 frames shown (12 of 12)]
[frame 41/48]
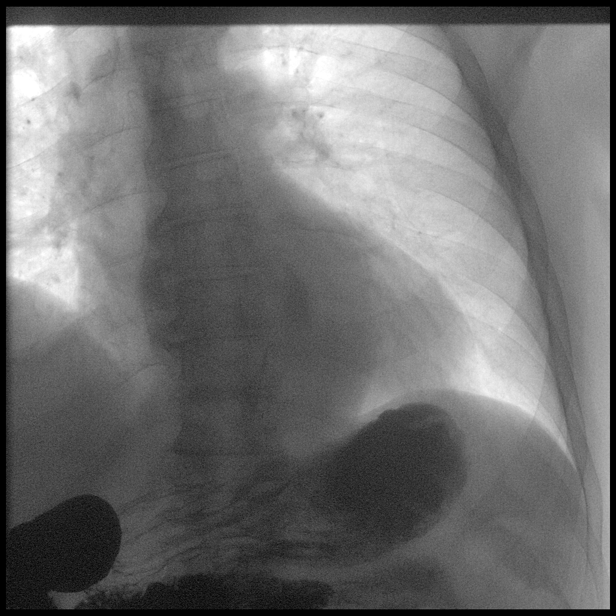

[14 of 24 positions shown; findings below may reference images not displayed]

FINDINGS: Swallowing: Prominent cricopharyngeal impression but no HJROSLAN
diverticulum. No laryngeal penetration or aspiration.

Pharynx: Unremarkable.

Esophagus: No esophageal mass or stricture. Esophagus is markedly
patulous.

Esophageal motility: Significant motility disorder with no
discernible primary peristaltic wave and infrequent esophageal
contractions. There is marked esophageal stasis.

Hiatal Hernia: None.

Gastroesophageal reflux: None visualized with provocation maneuvers.

Ingested 13mm barium tablet: 13 mm barium tablet passed normally.

Other: None.
IMPRESSION: 1. Significant esophageal dysmotility with patulous esophagus and
marked esophageal stasis.
2. No discrete mass or stricture. The 13 mm barium pill passed
without difficulty.
3. Prominent cricopharyngeal impression but no HJROSLAN
diverticulum.

## 2022-02-18 MED ORDER — RIZATRIPTAN BENZOATE 10 MG PO TABS
10.0000 mg | ORAL_TABLET | ORAL | 0 refills | Status: DC | PRN
Start: 2022-02-18 — End: 2022-03-13

## 2022-02-18 NOTE — Addendum Note (Signed)
Addended by: Radene Gunning on: 02/18/2022 03:00 PM   Modules accepted: Orders

## 2022-02-18 NOTE — Patient Instructions (Addendum)
Send me a message next week if headaches still aren't better.   OK to use Excedrin as needed. We can use the new medicine as needed as well.   Make sure your dermatologist knows about your skin on the neck.   Let us know if you need anything.

## 2022-02-18 NOTE — Progress Notes (Signed)
Agree with the assessment and plan as outlined by Amy Esterwood, PA-C.  Layton Naves, DO, FACG  

## 2022-02-18 NOTE — Progress Notes (Addendum)
Chief Complaint  Patient presents with   Headache    Subjective: Patient is a 76 y.o. male here for f/u headaches.  Started Topamax escalation around 1 week ago.  He is currently taking 25 mg in the evening and 50 mg in the morning.  Headaches are reportedly worse.  He has not taking Excedrin since this started and is not on an abortive medicine otherwise.  No new neurologic signs or symptoms.  History of AK's on his head and neck.  We have frozen some lesions but two remain on the lower hairline border on the left neck.  No pain, itching, or drainage.  Past Medical History:  Diagnosis Date   Allergy    Angina at rest [facility])    Arthritis    Back pain, lumbosacral    Back pain, thoracic    Depression    Eczema    GERD (gastroesophageal reflux disease)    Prostatitis    RSD (reflex sympathetic dystrophy)    on the right side   Vertigo     Objective: BP 137/70   Pulse 74   Temp 97.7 F (36.5 C) (Oral)   Ht 6' (1.829 m)   Wt 202 lb 2 oz (91.7 kg)   SpO2 96%   BMI 27.41 kg/m  General: Awake, appears stated age Neuro: DTR's equal and symmetric in UE's b/l, 2/4 on L patellar reflex, 1/4 on R patella (chronic) Lungs:  No accessory muscle use Skin: 2 scaly plaques over the posterior neck with pink base, slightly raised.  No fluctuance, drainage, excoriation Psych: Age appropriate judgment and insight, normal affect and mood  Procedure note: cryotherapy Verbal consent obtained 2 skin lesions treated Liquid nitrogen was applied via a thin spray creating an ice ball with 1-2 mm corona surrounding the lesion The patient tolerated the procedure well There were no immediate complications noted  Assessment and Plan: Chronic daily headache - Plan: rizatriptan (MAXALT) 10 MG tablet  Actinic keratoses  Chronic, not controlled.  Continue escalation of Topamax until 50 mg twice daily.  Send message in 1 week ago improvement and we will recommend adding magnesium 200 mg daily.  Add  Maxalt 10 mg daily as needed.  He did verify he has never had a stroke before and has had recent imaging to support this.  Okay to continue Excedrin.  I will see him as originally scheduled in early July. Cryotherapy today.  He has an appoint with his dermatologist in 3 weeks.  He will make sure to inform that physician of the back of his neck if this does not improve things. The patient voiced understanding and agreement to the plan.  [name] [place], DO 02/18/22  2:50 PM

## 2022-02-26 ENCOUNTER — Other Ambulatory Visit: Payer: Self-pay | Admitting: Family Medicine

## 2022-02-26 DIAGNOSIS — I951 Orthostatic hypotension: Secondary | ICD-10-CM

## 2022-03-02 ENCOUNTER — Encounter: Payer: Self-pay | Admitting: Family Medicine

## 2022-03-02 ENCOUNTER — Telehealth: Payer: Self-pay

## 2022-03-02 NOTE — Telephone Encounter (Signed)
Caller Name Tregg Etters Caller Phone Number [phone] Patient Name Cesar [name] Patient DOB Dec 20, 1945 Call Type Message Only Information Provided Reason for Call Request for General Office Information Initial Comment caller states he is wanting to get a call back from the office regarding a test he recently had on esophagus. Additional Comment Provided office hours Disp. Time Disposition Final User 03/01/2022 4:28:40 PM General Information Provided Yes Talmadge Coventry Call Closed By: Talmadge Coventry Transaction Date/Time: 03/01/2022 4:25:03 PM (ET)

## 2022-03-04 ENCOUNTER — Other Ambulatory Visit: Payer: Self-pay | Admitting: Family Medicine

## 2022-03-13 ENCOUNTER — Ambulatory Visit (INDEPENDENT_AMBULATORY_CARE_PROVIDER_SITE_OTHER): Payer: Medicare Other | Admitting: Family Medicine

## 2022-03-13 ENCOUNTER — Encounter: Payer: Self-pay | Admitting: Family Medicine

## 2022-03-13 VITALS — BP 120/82 | HR 75 | Temp 98.4°F | Ht 72.0 in | Wt 203.4 lb

## 2022-03-13 DIAGNOSIS — R519 Headache, unspecified: Secondary | ICD-10-CM

## 2022-03-13 MED ORDER — DIVALPROEX SODIUM ER 500 MG PO TB24: 500.0000 mg | ORAL_TABLET | Freq: Every day | ORAL | 1 refills | Status: DC

## 2022-03-13 MED ORDER — RIZATRIPTAN BENZOATE 10 MG PO TABS: 10.0000 mg | ORAL_TABLET | ORAL | 2 refills | Status: DC | PRN

## 2022-03-13 NOTE — Progress Notes (Addendum)
Chief Complaint  Patient presents with   Follow-up    Subjective: Patient is a 76 y.o. male here for f/u [facility].  Started on Topamax and ramped up to 50 mg bid. This does not help. Maxalt prn does help. No AE's.   Past Medical History:  Diagnosis Date   Allergy    Angina at rest [facility])    Arthritis    Back pain, lumbosacral    Back pain, thoracic    Depression    Eczema    GERD (gastroesophageal reflux disease)    Prostatitis    RSD (reflex sympathetic dystrophy)    on the right side   Vertigo     Objective: BP 120/82   Pulse 75   Temp 98.4 F (36.9 C) (Oral)   Ht 6' (1.829 m)   Wt 203 lb 6 oz (92.3 kg)   SpO2 99%   BMI 27.58 kg/m  General: Awake, appears stated age Neuro: 0/4 patellar reflex on R, 3/4 on R, 1/4 biceps reflex, no clonus, no cerebellar signs MSK: No ttp over temporalis, TMJ or subocc triangle/parasp msc Lungs: No accessory muscle use Psych: Age appropriate judgment and insight, normal affect and mood  Assessment and Plan: Chronic daily headache - Plan: rizatriptan (MAXALT) 10 MG tablet, divalproex (DEPAKOTE ER) 500 MG 24 hr tablet  Chronic, uncontrolled. Take 1/2 tab bid of Topamax for 3 d and 1/2 daily for 3 d and then stop. We are starting Depakote ER 500 mg/d. F/u in 1 mo.  The patient voiced understanding and agreement to the plan.  [name] [place], DO 03/13/22  3:37 PM

## 2022-03-13 NOTE — Patient Instructions (Addendum)
Take 1/2 tab twice daily for 3 days, then 1/2 tab for 3 days and then stop.   Let me know if the Maxalt or new headache medication is too expensive.   Let us know if you need anything.

## 2022-03-23 ENCOUNTER — Other Ambulatory Visit: Payer: Self-pay | Admitting: Family Medicine

## 2022-03-23 ENCOUNTER — Ambulatory Visit: Payer: Medicare Other | Admitting: Family Medicine

## 2022-03-25 ENCOUNTER — Other Ambulatory Visit: Payer: Self-pay | Admitting: Family Medicine

## 2022-04-13 ENCOUNTER — Ambulatory Visit (INDEPENDENT_AMBULATORY_CARE_PROVIDER_SITE_OTHER): Payer: Medicare Other | Admitting: Family Medicine

## 2022-04-13 ENCOUNTER — Encounter: Payer: Self-pay | Admitting: Physician Assistant

## 2022-04-13 ENCOUNTER — Other Ambulatory Visit (HOSPITAL_BASED_OUTPATIENT_CLINIC_OR_DEPARTMENT_OTHER): Payer: Self-pay

## 2022-04-13 ENCOUNTER — Encounter: Payer: Self-pay | Admitting: Family Medicine

## 2022-04-13 ENCOUNTER — Ambulatory Visit (INDEPENDENT_AMBULATORY_CARE_PROVIDER_SITE_OTHER): Payer: Medicare Other | Admitting: Physician Assistant

## 2022-04-13 ENCOUNTER — Other Ambulatory Visit (INDEPENDENT_AMBULATORY_CARE_PROVIDER_SITE_OTHER): Payer: Medicare Other

## 2022-04-13 VITALS — BP 110/70 | HR 68 | Ht 69.0 in | Wt 206.4 lb

## 2022-04-13 VITALS — BP 129/71 | HR 73 | Ht 69.0 in | Wt 207.6 lb

## 2022-04-13 DIAGNOSIS — R1310 Dysphagia, unspecified: Secondary | ICD-10-CM

## 2022-04-13 DIAGNOSIS — R110 Nausea: Secondary | ICD-10-CM

## 2022-04-13 DIAGNOSIS — R1010 Upper abdominal pain, unspecified: Secondary | ICD-10-CM

## 2022-04-13 DIAGNOSIS — R933 Abnormal findings on diagnostic imaging of other parts of digestive tract: Secondary | ICD-10-CM

## 2022-04-13 DIAGNOSIS — I208 Other forms of angina pectoris: Secondary | ICD-10-CM

## 2022-04-13 DIAGNOSIS — R519 Headache, unspecified: Secondary | ICD-10-CM

## 2022-04-13 LAB — CBC WITH DIFFERENTIAL/PLATELET
Basophils Absolute: 0 10*3/uL (ref 0.0–0.1)
Basophils Relative: 0.5 % (ref 0.0–3.0)
Eosinophils Absolute: 0.1 10*3/uL (ref 0.0–0.7)
Eosinophils Relative: 1.6 % (ref 0.0–5.0)
HCT: 44.3 % (ref 39.0–52.0)
Hemoglobin: 14.9 g/dL (ref 13.0–17.0)
Lymphocytes Relative: 15.3 % (ref 12.0–46.0)
Lymphs Abs: 1.2 10*3/uL (ref 0.7–4.0)
MCHC: 33.7 g/dL (ref 30.0–36.0)
MCV: 94.5 fl (ref 78.0–100.0)
Monocytes Absolute: 0.7 10*3/uL (ref 0.1–1.0)
Monocytes Relative: 8 % (ref 3.0–12.0)
Neutro Abs: 6.1 10*3/uL (ref 1.4–7.7)
Neutrophils Relative %: 74.6 % (ref 43.0–77.0)
Platelets: 204 10*3/uL (ref 150.0–400.0)
RBC: 4.68 Mil/uL (ref 4.22–5.81)
RDW: 14 % (ref 11.5–15.5)
WBC: 8.1 10*3/uL (ref 4.0–10.5)

## 2022-04-13 LAB — COMPREHENSIVE METABOLIC PANEL
ALT: 21 U/L (ref 0–53)
AST: 24 U/L (ref 0–37)
Albumin: 4.5 g/dL (ref 3.5–5.2)
Alkaline Phosphatase: 63 U/L (ref 39–117)
BUN: 12 mg/dL (ref 6–23)
CO2: 30 mEq/L (ref 19–32)
Calcium: 9.2 mg/dL (ref 8.4–10.5)
Chloride: 100 mEq/L (ref 96–112)
Creatinine, Ser: 0.91 mg/dL (ref 0.40–1.50)
GFR: 82.88 mL/min (ref >60.00)
Glucose, Bld: 82 mg/dL (ref 70–99)
Potassium: 4 mEq/L (ref 3.5–5.1)
Sodium: 139 mEq/L (ref 135–145)
Total Bilirubin: 0.8 mg/dL (ref 0.2–1.2)
Total Protein: 6.9 g/dL (ref 6.0–8.3)

## 2022-04-13 NOTE — Patient Instructions (Addendum)
Continue the magnesium.   Let me know if you need refills of anything.  I recommend getting the flu shot in mid October. This suggestion would change if the CDC comes out with a different recommendation.    Get your final Shingrix/shingles vaccine at the pharmacy.   Let us know if you need anything.

## 2022-04-13 NOTE — Progress Notes (Signed)
CC: F/u  Subjective: Patient is a 76 y.o. male here for f/u [facility].  Currently on Mg 500 mg/d. Reports compliance, no AE's. Headaches are much better. Stopped Topamax and started Mg. Prior to Mg, HA's were near constant and much more severe. Has not needed Maxalt.   Past Medical History:  Diagnosis Date   Allergy    Angina at rest [facility])    Arthritis    Back pain, lumbosacral    Back pain, thoracic    Depression    Eczema    GERD (gastroesophageal reflux disease)    Prostatitis    RSD (reflex sympathetic dystrophy)    on the right side   Vertigo     Objective: BP 129/71   Pulse 73   Ht 5\' 9"  (1.753 m)   Wt 207 lb 9.6 oz (94.2 kg)   SpO2 99%   BMI 30.66 kg/m  General: Awake, appears stated age Neuro: Gait is cautious  Lungs: No accessory muscle use Psych: Age appropriate judgment and insight, normal affect and mood  Assessment and Plan: Chronic daily headache  Chronic, stable. Cont Mg 500 mg/d, Maxalt 10 mg prn. F/u in 6 mo for med ck or prn.  The patient voiced understanding and agreement to the plan.  [place], DO 04/13/22  3:14 PM

## 2022-04-13 NOTE — Progress Notes (Unsigned)
Subjective:    Patient ID: Cesar [name], male    DOB: Jun 30, 1946, 76 y.o.   MRN: [id number]  HPI   Leon  Review of Systems Pertinent positive and negative review of systems were noted in the above HPI section.  All other review of systems was otherwise negative.   Outpatient Encounter Medications as of 04/13/2022  Medication Sig   Calcium-Vitamin D 600-200 MG-UNIT tablet Take by mouth.   lovastatin (MEVACOR) 10 MG tablet TAKE 1 TABLET BY MOUTH AT BEDTIME   metoprolol tartrate (LOPRESSOR) 25 MG tablet TAKE 1/2 TABLET BY MOUTH TWICE DAILY   midodrine (PROAMATINE) 5 MG tablet Take 1 tablet (5 mg total) by mouth 2 (two) times daily with a meal.   omeprazole (PRILOSEC) 20 MG capsule Take 1 capsule (20 mg total) by mouth daily.   ondansetron (ZOFRAN-ODT) 4 MG disintegrating tablet Take 1 tablet (4 mg total) by mouth every 8 (eight) hours as needed for nausea or vomiting.   polyethylene glycol (MIRALAX / GLYCOLAX) packet 17 g by Does not apply route.   prochlorperazine (COMPAZINE) 5 MG tablet Take 1 tablet (5 mg total) by mouth every 6 (six) hours as needed for nausea or vomiting.   sertraline (ZOLOFT) 50 MG tablet TAKE ONE TABLET BY MOUTH IN THE MORNING FOR PTSD, ANXIETY, AND DEPRESSION-- DISCONTINUE FLUOXETINE (PROZAC)   triamcinolone cream (KENALOG) 0.1 % Apply to affected areas twice daily as needed   Zoster Vaccine Adjuvanted [facility]) injection Inject into the muscle.   nitroGLYCERIN (NITROSTAT) 0.4 MG SL tablet Place 1 tablet (0.4 mg total) under the tongue every 5 (five) minutes as needed for chest pain. (Patient not taking: Reported on 04/13/2022)   rizatriptan (MAXALT) 10 MG tablet Take 1 tablet (10 mg total) by mouth as needed for migraine. May repeat in 2 hours if needed (Patient not taking: Reported on 04/13/2022)   [DISCONTINUED] aspirin 81 MG chewable tablet Chew by mouth daily.   [DISCONTINUED] divalproex (DEPAKOTE ER) 500 MG 24 hr tablet Take 1 tablet (500 mg total) by mouth  daily. (Patient not taking: Reported on 04/13/2022)   No facility-administered encounter medications on file as of 04/13/2022.   Allergies  Allergen Reactions   Formaldehyde Itching and Rash    SOLN   Latex Itching   Sulfa Antibiotics Itching, Nausea And Vomiting and Rash    15 POWD SOLN   Butorphanol Other (See Comments)    Drowsiness Drowsy Drowsiness   Celecoxib Rash    Flushing Flushing   Hydrocodone-Acetaminophen Other (See Comments) and Nausea And Vomiting   Metformin Other (See Comments)    Unknown   Oxycodone-Acetaminophen Nausea And Vomiting   Povidone Iodine Rash   Metformin Hcl Other (See Comments)    *blurred vision, 11/06/2009   Simvastatin Other (See Comments)    Myalgias   Tamsulosin Other (See Comments)    Dyspnea   Nifedipine Nausea Only   Nitroglycerin Rash    Nitroglycerin patch developed rash and blisters from adhesive and latex   Povidone-Iodine Rash   Quaternium-15 Rash   Rofecoxib Nausea Only    Flushing   Tape Itching and Rash   Patient Active Problem List   Diagnosis Date Noted   Hx of adenomatous colonic polyps 02/10/2022   PTSD (post-traumatic stress disorder) 07/08/2020   Chronic bilateral low back pain without sciatica 07/08/2020   GAD (generalized anxiety disorder) 07/08/2020   Papular urticaria 10/26/2016   Dermographia 10/26/2016   Gastroesophageal reflux disease without esophagitis 09/25/2016   Angina  at rest [facility]) 09/25/2016   Other allergic rhinitis 09/25/2016   Allergic urticaria 09/25/2016   Social History   Socioeconomic History   Marital status: Widowed    Spouse name: Not on file   Number of children: 0   Years of education: Not on file   Highest education level: Not on file  Occupational History   Not on file  Tobacco Use   Smoking status: Never   Smokeless tobacco: Never  Vaping Use   Vaping Use: Never used  Substance and Sexual Activity   Alcohol use: No   Drug use: No   Sexual activity: Not on file  Other  Topics Concern   Not on file  Social History Narrative   Not on file   Social Determinants of Health   Financial Resource Strain: Low Risk  (10/14/2021)   Overall Financial Resource Strain (CARDIA)    Difficulty of Paying Living Expenses: Not hard at all  Food Insecurity: No Food Insecurity (10/14/2021)   Hunger Vital Sign    Worried About Running Out of Food in the Last Year: Never true    Ran Out of Food in the Last Year: Never true  Transportation Needs: No Transportation Needs (10/14/2021)   PRAPARE - Administrator, Civil Service (Medical): No    Lack of Transportation (Non-Medical): No  Physical Activity: Sufficiently Active (10/14/2021)   Exercise Vital Sign    Days of Exercise per Week: 5 days    Minutes of Exercise per Session: 30 min  Stress: No Stress Concern Present (10/14/2021)   [employer] of Occupational Health - Occupational Stress Questionnaire    Feeling of Stress : Not at all  Social Connections: Moderately Isolated (10/14/2021)   Social Connection and Isolation Panel [NHANES]    Frequency of Communication with Friends and Family: Three times a week    Frequency of Social Gatherings with Friends and Family: Twice a week    Attends Religious Services: Never    Database administrator or Organizations: Yes    Attends Engineer, structural: More than 4 times per year    Marital Status: Widowed  Intimate Partner Violence: Not At Risk (10/14/2021)   Humiliation, Afraid, Rape, and Kick questionnaire    Fear of Current or Ex-Partner: No    Emotionally Abused: No    Physically Abused: No    Sexually Abused: No    Mr. Maclaren's family history is not on file.      Objective:    Vitals:   04/13/22 1044  BP: 110/70  Pulse: 68    Physical Exam Well-developed well-nourished eld WM  in no acute distress. Very pleasant  Height, Weight,2017  BMI 30.6  HEENT; nontraumatic normocephalic, EOMI, PE R LA, sclera anicteric. Oropharynx;not examined today   Neck; supple, no JVD Cardiovascular; regular rate and rhythm with S1-S2, no murmur rub or gallop Pulmonary; Clear bilaterally Abdomen; soft, tender in epigastrium/ hypogastrium  nondistended, no palpable mass or hepatosplenomegaly, bowel sounds are active Rectal;not done today  Skin; benign exam, no jaundice rash or appreciable lesions Extremities; no clubbing cyanosis or edema skin warm and dry Neuro/Psych; alert and oriented x4, grossly nonfocal mood and affect appropriate        Assessment & Plan:     Sammuel Cooper PA-C 04/13/2022   Cc: Sharlene Dory*

## 2022-04-13 NOTE — Patient Instructions (Addendum)
_______________________________________________________  If you are age 75 or older, your body mass index should be between 23-30. Your Body mass index is 30.48 kg/m. If this is out of the aforementioned range listed, please consider follow up with your Primary Care Provider.  If you are age 43 or younger, your body mass index should be between 19-25. Your Body mass index is 30.48 kg/m. If this is out of the aformentioned range listed, please consider follow up with your Primary Care Provider.   ________________________________________________________  The [facility] providers would like to encourage you to use [facility] to communicate with providers for non-urgent requests or questions.  Due to long hold times on the telephone, sending your provider a message by [facility] may be a faster and more efficient way to get a response.  Please allow 48 business hours for a response.  Please remember that this is for non-urgent requests.  _______________________________________________________  Your provider has requested that you go to the basement level for lab work before leaving today. Press "B" on the elevator. The lab is located at the first door on the left as you exit the elevator.   You have been scheduled for a CT scan of the abdomen and pelvis at [facility], 1st floor Radiology. You are scheduled on 04/21/22 at 2:00 pm. You should arrive 15 minutes prior to your appointment time for registration.  We are giving you 2 bottles of contrast today that you will need to drink before arriving for the exam. The solution may taste better if refrigerated so put them in the refrigerator when you get home, but do NOT add ice or any other liquid to this solution as that would dilute it. Shake well before drinking.   Please follow the written instructions below on the day of your exam:   1) Do not eat anything after 10 am (4 hours prior to your test)   2) Drink 1 bottle of contrast @ 12 pm (2 hours  prior to your exam)  Remember to shake well before drinking and do NOT pour over ice.     Drink 1 bottle of contrast @ 1 pm (1 hour prior to your exam)   You may take any medications as prescribed with a small amount of water, if necessary. If you take any of the following medications: METFORMIN, GLUCOPHAGE, [place], AVANDAMET, RIOMET, FORTAMET, [place] MET, JANUMET, GLUMETZA or METAGLIP, you MAY be asked to HOLD this medication 48 hours AFTER the exam.   You have been scheduled for an esophageal manometry at [facility] on 08/12/2022 at 12:30 pm. Please arrive 30 minutes prior to your procedure for registration. You will need to go to outpatient registration (1st floor of the hospital) first. Make certain to bring your insurance cards as well as a complete list of medications.  Please remember the following:  1) Do not take any muscle relaxants, xanax (alprazolam) or ativan for 1 day prior to your test as well as the day of the test.  2) Nothing to eat or drink for 8 hours before your test.  3) Hold all diabetic medications/insulin the morning of the test. You may eat and take your medications after the test.  It will take at least 2 weeks to receive the results of this test from your physician. ------------------------------------------ ABOUT ESOPHAGEAL MANOMETRY Esophageal manometry (muh-NOM-uh-tree) is a test that gauges how well your esophagus works. Your esophagus is the long, muscular tube that connects your throat to your stomach. Esophageal manometry  measures the rhythmic muscle contractions (peristalsis) that occur in your esophagus when you swallow. Esophageal manometry also measures the coordination and force exerted by the muscles of your esophagus.  During esophageal manometry, a thin, flexible tube (catheter) that contains sensors is passed through your nose, down your esophagus and into your stomach. Esophageal manometry can be helpful in diagnosing some mostly  uncommon disorders that affect your esophagus.  Why it's done Esophageal manometry is used to evaluate the movement (motility) of food through the esophagus and into the stomach. The test measures how well the circular bands of muscle (sphincters) at the top and bottom of your esophagus open and close, as well as the pressure, strength and pattern of the wave of esophageal muscle contractions that moves food along.  What you can expect Esophageal manometry is an outpatient procedure done without sedation. Most people tolerate it well. You may be asked to change into a hospital gown before the test starts.  During esophageal manometry  While you are sitting up, a member of your health care team sprays your throat with a numbing medication or puts numbing gel in your nose or both.  A catheter is guided through your nose into your esophagus. The catheter may be sheathed in a water-filled sleeve. It doesn't interfere with your breathing. However, your eyes may water, and you may gag. You may have a slight nosebleed from irritation.  After the catheter is in place, you may be asked to lie on your back on an exam table, or you may be asked to remain seated.  You then swallow small sips of water. As you do, a computer connected to the catheter records the pressure, strength and pattern of your esophageal muscle contractions.  During the test, you'll be asked to breathe slowly and smoothly, remain as still as possible, and swallow only when you're asked to do so.  A member of your health care team may move the catheter down into your stomach while the catheter continues its measurements.  The catheter then is slowly withdrawn. The test usually lasts 20 to 30 minutes.  After esophageal manometry  When your esophageal manometry is complete, you may return to your normal activities  This test typically takes 30-45 minutes to  complete. ________________________________________________________________________________  Cesar [name] have been scheduled for an endoscopy. Please follow written instructions given to you at your visit today. If you use inhalers (even only as needed), please bring them with you on the day of your procedure.   Due to recent changes in healthcare laws, you may see the results of your imaging and laboratory studies on MyChart before your provider has had a chance to review them.  We understand that in some cases there may be results that are confusing or concerning to you. Not all laboratory results come back in the same time frame and the provider may be waiting for multiple results in order to interpret others.  Please give [place] 48 hours in order for your provider to thoroughly review all the results before contacting the office for clarification of your results.    Thank you for entrusting me with your care and choosing [facility]  Nicoletta Ba, PA

## 2022-04-21 ENCOUNTER — Ambulatory Visit (HOSPITAL_COMMUNITY)
Admission: RE | Admit: 2022-04-21 | Discharge: 2022-04-21 | Disposition: A | Discharge status: Home / Self Care | Payer: Medicare Other | Source: Ambulatory Visit | Attending: Physician Assistant | Admitting: Physician Assistant

## 2022-04-21 ENCOUNTER — Encounter (HOSPITAL_COMMUNITY): Payer: Self-pay

## 2022-04-21 DIAGNOSIS — R1010 Upper abdominal pain, unspecified: Secondary | ICD-10-CM | POA: Insufficient documentation

## 2022-04-21 MED ORDER — SODIUM CHLORIDE (PF) 0.9 % IJ SOLN
INTRAMUSCULAR | Status: AC
  Filled 2022-04-21: qty 50

## 2022-04-21 MED ORDER — IOHEXOL 300 MG/ML  SOLN
100.0000 mL | Freq: Once | INTRAMUSCULAR | Status: AC | PRN
  Administered 2022-04-21: 100 mL via INTRAVENOUS

## 2022-04-23 NOTE — Progress Notes (Signed)
Agree with the assessment and plan as outlined by Amy Esterwood, PA-C.  Harm Jou, DO, FACG  

## 2022-05-01 ENCOUNTER — Encounter: Payer: Self-pay | Admitting: Gastroenterology

## 2022-05-01 ENCOUNTER — Ambulatory Visit (AMBULATORY_SURGERY_CENTER): Payer: Medicare Other | Admitting: Gastroenterology

## 2022-05-01 VITALS — BP 120/75 | HR 57 | Temp 96.8°F | Resp 13 | Ht 69.0 in | Wt 206.0 lb

## 2022-05-01 DIAGNOSIS — B3781 Candidal esophagitis: Secondary | ICD-10-CM

## 2022-05-01 DIAGNOSIS — R1310 Dysphagia, unspecified: Secondary | ICD-10-CM

## 2022-05-01 DIAGNOSIS — R933 Abnormal findings on diagnostic imaging of other parts of digestive tract: Secondary | ICD-10-CM

## 2022-05-01 DIAGNOSIS — K219 Gastro-esophageal reflux disease without esophagitis: Secondary | ICD-10-CM

## 2022-05-01 DIAGNOSIS — K259 Gastric ulcer, unspecified as acute or chronic, without hemorrhage or perforation: Secondary | ICD-10-CM | POA: Diagnosis not present

## 2022-05-01 DIAGNOSIS — K2970 Gastritis, unspecified, without bleeding: Secondary | ICD-10-CM | POA: Diagnosis not present

## 2022-05-01 DIAGNOSIS — K449 Diaphragmatic hernia without obstruction or gangrene: Secondary | ICD-10-CM

## 2022-05-01 DIAGNOSIS — R140 Abdominal distension (gaseous): Secondary | ICD-10-CM

## 2022-05-01 MED ORDER — SODIUM CHLORIDE 0.9 % IV SOLN: 500.0000 mL | Freq: Once | INTRAVENOUS | Status: DC

## 2022-05-01 MED ORDER — FLUCONAZOLE 200 MG PO TABS: 200.0000 mg | ORAL_TABLET | Freq: Every day | ORAL | 0 refills | Status: DC

## 2022-05-01 MED ORDER — PANTOPRAZOLE SODIUM 40 MG PO TBEC: 40.0000 mg | DELAYED_RELEASE_TABLET | Freq: Two times a day (BID) | ORAL | 3 refills | Status: DC

## 2022-05-01 NOTE — Progress Notes (Signed)
Called to room to assist during endoscopic procedure.  Patient ID and intended procedure confirmed with present staff. Received instructions for my participation in the procedure from the performing physician.  

## 2022-05-01 NOTE — Patient Instructions (Signed)
Impression/Recommendations:  Hiatal hernia, gastritis, and dilation diet handouts given to patient.  Resume previous diet. Continue present medications. Await pathology results.  Change (omeprazole) Prilosec to Protonix (pantoprazole) 40 mg by mout 2 times for 4 weeks to promote healing, then reduce to 40 mg daily for control of reflux.  Diflucan (fluconazole) 400 mg one time, then 20 mg. By mouth daily for 3 weeks.Perform routine esophageal manometry as scheduled.  Follow-up in the GI Clinic after esophageal manometry, or sooner if needed.  YOU HAD AN ENDOSCOPIC PROCEDURE TODAY AT THE [facility]:   Refer to the procedure report that was given to you for any specific questions about what was found during the examination.  If the procedure report does not answer your questions, please call your gastroenterologist to clarify.  If you requested that your care partner not be given the details of your procedure findings, then the procedure report has been included in a sealed envelope for you to review at your convenience later.  YOU SHOULD EXPECT: Some feelings of bloating in the abdomen. Passage of more gas than usual.  Walking can help get rid of the air that was put into your GI tract during the procedure and reduce the bloating. If you had a lower endoscopy (such as a colonoscopy or flexible sigmoidoscopy) you may notice spotting of blood in your stool or on the toilet paper. If you underwent a bowel prep for your procedure, you may not have a normal bowel movement for a few days.  Please Note:  You might notice some irritation and congestion in your nose or some drainage.  This is from the oxygen used during your procedure.  There is no need for concern and it should clear up in a day or so.  SYMPTOMS TO REPORT IMMEDIATELY: Following upper endoscopy (EGD)  Vomiting of blood or coffee ground material  New chest pain or pain under the shoulder blades  Painful or persistently  difficult swallowing  New shortness of breath  Fever of 100F or higher  Black, tarry-looking stools  For urgent or emergent issues, a gastroenterologist can be reached at any hour by calling [phone]. Do not use MyChart messaging for urgent concerns.    DIET:  We do recommend a small meal at first, but then you may proceed to your regular diet.  Drink plenty of fluids but you should avoid alcoholic beverages for 24 hours.  ACTIVITY:  You should plan to take it easy for the rest of today and you should NOT DRIVE or use heavy machinery until tomorrow (because of the sedation medicines used during the test).    FOLLOW UP: Our staff will call the number listed on your records the next business day following your procedure.  We will call around 7:15- 8:00 am to check on you and address any questions or concerns that you may have regarding the information given to you following your procedure. If we do not reach you, we will leave a message.  If you develop any symptoms (ie: fever, flu-like symptoms, shortness of breath, cough etc.) before then, please call [phone].  If you test positive for Covid 19 in the 2 weeks post procedure, please call and report this information to [place].    If any biopsies were taken you will be contacted by phone or by letter within the next 1-3 weeks.  Please call us at [phone] if you have not heard about the biopsies in 3 weeks.    SIGNATURES/CONFIDENTIALITY:  You and/or your care partner have signed paperwork which will be entered into your electronic medical record.  These signatures attest to the fact that that the information above on your After Visit Summary has been reviewed and is understood.  Full responsibility of the confidentiality of this discharge information lies with you and/or your care-partner.

## 2022-05-01 NOTE — Progress Notes (Signed)
Report to PACU, RN, vss, BBS= Clear.  

## 2022-05-01 NOTE — Progress Notes (Signed)
GASTROENTEROLOGY PROCEDURE H&P NOTE   Primary Care Physician: Sharlene Dory, DO    Reason for Procedure:   Dysphagia, bloating  Plan:    EGD  Patient is appropriate for endoscopic procedure(s) in the ambulatory ([facility]) setting.  The nature of the procedure, as well as the risks, benefits, and alternatives were carefully and thoroughly reviewed with the patient. Ample time for discussion and questions allowed. The patient understood, was satisfied, and agreed to proceed.     HPI: Cesar [name] is a 76 y.o. male who presents for EGD for evaluation of progressive solid food dysphagia and bloating.  Patient was most recently seen in the Gastroenterology Clinic on 04/13/2022.  No interval change in medical history since that appointment. Please refer to that note for full details regarding GI history and clinical presentation.   Past Medical History:  Diagnosis Date   Allergy    Angina at rest [facility])    Arthritis    Back pain, lumbosacral    Back pain, thoracic    Depression    Eczema    GERD (gastroesophageal reflux disease)    Prostatitis    RSD (reflex sympathetic dystrophy)    on the right side   Vertigo     Past Surgical History:  Procedure Laterality Date   BACK SURGERY  march 1998 and december 1998   carpel tunnel Left    cholecystectectomy     ESOPHAGEAL DILATION     stretching of the esophagus   NASAL SINUS SURGERY     NASAL SINUS SURGERY     thumb joint replacement Left 2003   TONSILLECTOMY      Prior to Admission medications   Medication Sig Start Date End Date Taking? Authorizing Provider  Calcium-Vitamin D 600-200 MG-UNIT tablet Take by mouth.   Yes [provider]  lovastatin (MEVACOR) 10 MG tablet TAKE 1 TABLET BY MOUTH AT BEDTIME 03/04/22  Yes Wendling, [name], DO  metoprolol tartrate (LOPRESSOR) 25 MG tablet TAKE 1/2 TABLET BY MOUTH TWICE DAILY 03/25/22  Yes Sharlene Dory, DO  midodrine (PROAMATINE) 5 MG tablet Take  1 tablet (5 mg total) by mouth 2 (two) times daily with a meal. 02/26/22  Yes Wendling, [name], DO  omeprazole (PRILOSEC) 20 MG capsule Take 1 capsule (20 mg total) by mouth daily. 03/07/21  Yes Wendling, [name], DO  sertraline (ZOLOFT) 50 MG tablet TAKE ONE TABLET BY MOUTH IN THE MORNING FOR PTSD, ANXIETY, AND DEPRESSION-- DISCONTINUE FLUOXETINE (PROZAC) 01/27/22  Yes [provider]  nitroGLYCERIN (NITROSTAT) 0.4 MG SL tablet Place 1 tablet (0.4 mg total) under the tongue every 5 (five) minutes as needed for chest pain. Patient not taking: Reported on 04/13/2022 02/05/21   Sharlene Dory, DO  ondansetron (ZOFRAN-ODT) 4 MG disintegrating tablet Take 1 tablet (4 mg total) by mouth every 8 (eight) hours as needed for nausea or vomiting. 01/12/22   Wendling, [name], DO  polyethylene glycol (MIRALAX / GLYCOLAX) packet 17 g by Does not apply route.    [provider]  prochlorperazine (COMPAZINE) 5 MG tablet Take 1 tablet (5 mg total) by mouth every 6 (six) hours as needed for nausea or vomiting. 01/19/22   Carmelia Roller, [name], DO  rizatriptan (MAXALT) 10 MG tablet Take 1 tablet (10 mg total) by mouth as needed for migraine. May repeat in 2 hours if needed Patient not taking: Reported on 04/13/2022 03/13/22   Sharlene Dory, DO  triamcinolone cream (KENALOG) 0.1 %  Apply to affected areas twice daily as needed 09/27/15   [provider]  Zoster Vaccine Adjuvanted [facility]) injection Inject into the muscle. 11/11/21   Judyann Munson, MD    Current Outpatient Medications  Medication Sig Dispense Refill   Calcium-Vitamin D 600-200 MG-UNIT tablet Take by mouth.     lovastatin (MEVACOR) 10 MG tablet TAKE 1 TABLET BY MOUTH AT BEDTIME 30 tablet 2   metoprolol tartrate (LOPRESSOR) 25 MG tablet TAKE 1/2 TABLET BY MOUTH TWICE DAILY 90 tablet 1   midodrine (PROAMATINE) 5 MG tablet Take 1 tablet (5 mg total) by mouth 2 (two) times daily with a meal. 180 tablet  1   omeprazole (PRILOSEC) 20 MG capsule Take 1 capsule (20 mg total) by mouth daily. 90 capsule 2   sertraline (ZOLOFT) 50 MG tablet TAKE ONE TABLET BY MOUTH IN THE MORNING FOR PTSD, ANXIETY, AND DEPRESSION-- DISCONTINUE FLUOXETINE (PROZAC)     nitroGLYCERIN (NITROSTAT) 0.4 MG SL tablet Place 1 tablet (0.4 mg total) under the tongue every 5 (five) minutes as needed for chest pain. (Patient not taking: Reported on 04/13/2022) 30 tablet 2   ondansetron (ZOFRAN-ODT) 4 MG disintegrating tablet Take 1 tablet (4 mg total) by mouth every 8 (eight) hours as needed for nausea or vomiting. 20 tablet 0   polyethylene glycol (MIRALAX / GLYCOLAX) packet 17 g by Does not apply route.     prochlorperazine (COMPAZINE) 5 MG tablet Take 1 tablet (5 mg total) by mouth every 6 (six) hours as needed for nausea or vomiting. 30 tablet 0   rizatriptan (MAXALT) 10 MG tablet Take 1 tablet (10 mg total) by mouth as needed for migraine. May repeat in 2 hours if needed (Patient not taking: Reported on 04/13/2022) 10 tablet 2   triamcinolone cream (KENALOG) 0.1 % Apply to affected areas twice daily as needed     Zoster Vaccine Adjuvanted [facility]) injection Inject into the muscle. 1 each 1   Current Facility-Administered Medications  Medication Dose Route Frequency Provider Last Rate Last Admin   0.9 %  sodium chloride infusion  500 mL Intravenous Once Jasha Hodzic V, DO        Allergies as of 05/01/2022 - Review Complete 05/01/2022  Allergen Reaction Noted   Formaldehyde Itching and Rash 10/09/2014   Latex Itching 10/09/2014   Sulfa antibiotics Itching, Nausea And Vomiting, and Rash 02/25/2016   Butorphanol Other (See Comments) 05/09/2014   Celecoxib Rash 05/09/2014   Hydrocodone-acetaminophen Other (See Comments) and Nausea And Vomiting 05/09/2014   Metformin Other (See Comments) 09/17/2015   Oxycodone-acetaminophen Nausea And Vomiting 10/09/2014   Povidone iodine Rash 05/09/2014   Metformin hcl Other (See Comments)  05/09/2014   Simvastatin Other (See Comments) 05/09/2014   Tamsulosin Other (See Comments) 05/09/2014   Nifedipine Nausea Only 09/17/2015   Nitroglycerin Rash 01/07/2016   Povidone-iodine Rash 09/17/2015   Quaternium-15 Rash 09/22/2016   Rofecoxib Nausea Only 05/09/2014   Tape Itching and Rash 09/17/2015    Family History  Problem Relation Age of Onset   Allergic rhinitis Neg Hx    Angioedema Neg Hx    Asthma Neg Hx    Eczema Neg Hx    Immunodeficiency Neg Hx    Urticaria Neg Hx    Colon cancer Neg Hx    Colon polyps Neg Hx    Stomach cancer Neg Hx    Pancreatic cancer Neg Hx    Rectal cancer Neg Hx    Esophageal cancer Neg Hx  Social History   Socioeconomic History   Marital status: Widowed    Spouse name: Not on file   Number of children: 0   Years of education: Not on file   Highest education level: Not on file  Occupational History   Not on file  Tobacco Use   Smoking status: Never   Smokeless tobacco: Never  Vaping Use   Vaping Use: Never used  Substance and Sexual Activity   Alcohol use: No   Drug use: No   Sexual activity: Not on file  Other Topics Concern   Not on file  Social History Narrative   Not on file   Social Determinants of Health   Financial Resource Strain: Low Risk  (10/14/2021)   Overall Financial Resource Strain (CARDIA)    Difficulty of Paying Living Expenses: Not hard at all  Food Insecurity: No Food Insecurity (10/14/2021)   Hunger Vital Sign    Worried About Running Out of Food in the Last Year: Never true    Ran Out of Food in the Last Year: Never true  Transportation Needs: No Transportation Needs (10/14/2021)   PRAPARE - Administrator, Civil Service (Medical): No    Lack of Transportation (Non-Medical): No  Physical Activity: Sufficiently Active (10/14/2021)   Exercise Vital Sign    Days of Exercise per Week: 5 days    Minutes of Exercise per Session: 30 min  Stress: No Stress Concern Present (10/14/2021)   [employer] of Occupational Health - Occupational Stress Questionnaire    Feeling of Stress : Not at all  Social Connections: Moderately Isolated (10/14/2021)   Social Connection and Isolation Panel [NHANES]    Frequency of Communication with Friends and Family: Three times a week    Frequency of Social Gatherings with Friends and Family: Twice a week    Attends Religious Services: Never    Database administrator or Organizations: Yes    Attends Engineer, structural: More than 4 times per year    Marital Status: Widowed  Intimate Partner Violence: Not At Risk (10/14/2021)   Humiliation, Afraid, Rape, and Kick questionnaire    Fear of Current or Ex-Partner: No    Emotionally Abused: No    Physically Abused: No    Sexually Abused: No    Physical Exam: Vital signs in last 24 hours: @BP  (!) 141/78   Pulse (!) 58   Temp (!) 96.8 F (36 C)   Resp 12   Ht 5\' 9"  (1.753 m)   Wt 206 lb (93.4 kg)   SpO2 99%   BMI 30.42 kg/m  GEN: NAD EYE: Sclerae anicteric ENT: MMM CV: Non-tachycardic Pulm: CTA b/l GI: Soft, NT/ND NEURO:  Alert & Oriented x 3   , DO [facility]   05/01/2022 8:57 AM

## 2022-05-01 NOTE — Op Note (Signed)
[facility] Patient Name: Cesar [name] Procedure Date: 05/01/2022 8:53 AM MRN: [id number] Endoscopist: Gerrit Heck , MD Age: 76 Referring MD:  Date of Birth: 08-20-1946 Gender: Male Account #: [id number] Procedure:                Upper GI endoscopy Indications:              Dysphagia, Odynophagia, Suspected esophageal                            reflux, Abdominal bloating Medicines:                Monitored Anesthesia Care Procedure:                Pre-Anesthesia Assessment:                           - Prior to the procedure, a History and Physical                            was performed, and patient medications and                            allergies were reviewed. The patient's tolerance of                            previous anesthesia was also reviewed. The risks                            and benefits of the procedure and the sedation                            options and risks were discussed with the patient.                            All questions were answered, and informed consent                            was obtained. Prior Anticoagulants: The patient has                            taken no previous anticoagulant or antiplatelet                            agents. ASA Grade Assessment: III - A patient with                            severe systemic disease. After reviewing the risks                            and benefits, the patient was deemed in                            satisfactory condition to undergo the procedure.  After obtaining informed consent, the endoscope was                            passed under direct vision. Throughout the                            procedure, the patient's blood pressure, pulse, and                            oxygen saturations were monitored continuously. The                            GIF [id number] was introduced through the                            mouth, and advanced to the second  part of duodenum.                            The upper GI endoscopy was accomplished without                            difficulty. The patient tolerated the procedure                            well. Scope In: Scope Out: Findings:                 Localized plaques were found in the middle third of                            the esophagus. Biopsies were taken with a cold                            forceps for histology. Estimated blood loss was                            minimal.                           A 3 cm sliding type hiatal hernia was present.                           The upper third of the esophagus and lower third of                            the esophagus were normal. There was retained fluid                            in the lower esophagus, highly suggestive of                            esophageal stasis. This was easily lavaged. Given  symptoms of dysphagia, the decision was made to                            proceed with esophageal dilation. A guidewire was                            placed and the scope was withdrawn. Dilation was                            performed with a Savary dilator with no resistance                            at 17 mm. The dilation site was examined following                            endoscope reinsertion and showed no bleeding,                            mucosal tear or perforation. Estimated blood loss:                            none.                           The Z-line was regular and was found 42 cm from the                            incisors.                           Patchy mild inflammation characterized by                            congestion (edema) and erythema was found in the                            gastric body, at the incisura and in the gastric                            antrum. Biopsies were taken with a cold forceps for                            histology and H pylori evaluation. Estimated  blood                            loss was minimal.                           The examined duodenum was normal. Biopsies were                            taken with a cold forceps for histology. Estimated  blood loss was minimal. Complications:            No immediate complications. Estimated Blood Loss:     Estimated blood loss was minimal. Impression:               - Esophageal plaques were found, suspicious for                            candidiasis. Biopsied.                           - 3 cm hiatal hernia.                           - Normal upper third of esophagus and lower third                            of esophagus. Dilated with 17 mm Savary dilator.                           - Retained fluid in the lower esophagus consistent                            with stasis and esophageal dysmotility.                           - Z-line regular, 42 cm from the incisors.                           - Gastritis. Biopsied.                           - Normal examined duodenum. Biopsied. Recommendation:           - Patient has a contact number available for                            emergencies. The signs and symptoms of potential                            delayed complications were discussed with the                            patient. Return to normal activities tomorrow.                            Written discharge instructions were provided to the                            patient.                           - Resume previous diet.                           - Continue present medications.                           -  Await pathology results.                           - Change Prilosec (omeprazole) to Protonix                            (pantoprazole) 40 mg PO BID for 4 weeks to promote                            mucosal healing, then reduce to 40 mg daily for                            control of reflux.                           - Diflucan (fluconazole) 400 mg x1  then 200 mg PO                            daily for 3 weeks.                           - Perform routine esophageal manometry as scheduled.                           - Follow-up in the GI Clinic after the esophageal                            manometry, or sooner if needed. Doristine Locks, MD 05/01/2022 9:25:44 AM

## 2022-05-04 ENCOUNTER — Telehealth: Payer: Self-pay | Admitting: *Deleted

## 2022-05-04 NOTE — Telephone Encounter (Signed)
  Follow up Call-     05/01/2022    8:09 AM  Call back number  Post procedure Call Back phone  # [phone]  Permission to leave phone message Yes     Patient questions:  Do you have a fever, pain , or abdominal swelling? No. Pain Score  0 *  Have you tolerated food without any problems? Yes.    Have you been able to return to your normal activities? Yes.    Do you have any questions about your discharge instructions: Diet   No. Medications  No. Follow up visit  No.  Do you have questions or concerns about your Care? No.  Actions: * If pain score is 4 or above: No action needed, pain <4.

## 2022-05-14 ENCOUNTER — Telehealth: Payer: Self-pay | Admitting: Gastroenterology

## 2022-05-14 NOTE — Telephone Encounter (Signed)
Okay to go on short course of steroids as prescribed by his Orthopedic Surgeon.  The pantoprazole that he is prescribed will provide gastric prophylaxis.  Please ensure that he is taking this as prescribed.  Hiatal hernia was found at the time of upper endoscopy does not necessarily require surgery.  These can be largely managed medically by controlling reflux and reflux symptoms.  This is less likely the culprit of abdominal bloating.  For the bloating, I would more so recommend low FODMAP diet and diet diary.  Can also trial Bentyl 10 mg prn every 6 hours if any component of abdominal cramping/discomfort.  There is a possibility that the bloating could also be related to dysmotility.  We will see what the manometry shows.  He does not need to wait until after the esophageal manometry.  If he prefers an appointment sooner, can have routine follow-up with me or Amy who he had seen in clinic previously.

## 2022-05-14 NOTE — Telephone Encounter (Signed)
Pt stated he saw his orthopedic doctor because he needs to have shoulder surgery and the doctor wanted to put him on prednisone. Pt was concerned because he looked up online that prednisone isn't good if you have abdominal bloating. Pt wanted to ask doctor if he can take prednisone with his bloating. Pt was also concerned about the hiatal hernia that was found and if this could be making his bloating worse. Pt also wanted to know if anything would be done for the hiatal hernia or if he would have it forever. Pt mentioned he is still having some trouble swallowing and his abdominal bloating is really bothering him. Does he need a follow up appointment or should he wait until after he has the esophageal manometry in December?

## 2022-05-14 NOTE — Telephone Encounter (Signed)
Inbound call from patient stating that he had a procedure on 8/25 and states he has a question he would like to ask the nurse. Please advise.

## 2022-05-15 ENCOUNTER — Other Ambulatory Visit: Payer: Self-pay

## 2022-05-15 ENCOUNTER — Telehealth: Payer: Self-pay | Admitting: Pharmacy Technician

## 2022-05-15 ENCOUNTER — Other Ambulatory Visit (HOSPITAL_COMMUNITY): Payer: Self-pay

## 2022-05-15 ENCOUNTER — Ambulatory Visit: Payer: Medicare Other | Admitting: Family Medicine

## 2022-05-15 MED ORDER — DICYCLOMINE HCL 10 MG PO CAPS: 10.0000 mg | ORAL_CAPSULE | Freq: Four times a day (QID) | ORAL | 0 refills | Status: DC | PRN

## 2022-05-15 NOTE — Telephone Encounter (Signed)
Patient Advocate Encounter  Received notification that prior authorization for DICYCLOMINE is required.   PA submitted on 9.8.23 Key [id number] Status is pending    Ricke Hey, CPhT Patient Advocate Phone: [phone]

## 2022-05-15 NOTE — Telephone Encounter (Signed)
Gave pt Dr. Frankey Shown message. Pt confirmed he is taking protonix as prescribed. Low FODMAP eating plan instructions sent to pt's mychart. Bentyl sent to pt's pharmacy for abdominal discomfort. Pt scheduled for appointment on 10/25 at 10:40 am with Dr. Barron Alvine. Pt verbalized understanding.

## 2022-05-15 NOTE — Telephone Encounter (Signed)
Patient Advocate Encounter  Prior Authorization for Dicyclomine 10mg  capsules has been approved.    Effective: 05-15-2022 to 05-15-2023  Test claim returns a $15.00 co-pay

## 2022-06-04 ENCOUNTER — Emergency Department (HOSPITAL_BASED_OUTPATIENT_CLINIC_OR_DEPARTMENT_OTHER): Payer: Medicare Other

## 2022-06-04 ENCOUNTER — Other Ambulatory Visit: Payer: Self-pay

## 2022-06-04 ENCOUNTER — Emergency Department (HOSPITAL_BASED_OUTPATIENT_CLINIC_OR_DEPARTMENT_OTHER)
Admission: EM | Admit: 2022-06-04 | Discharge: 2022-06-04 | Disposition: A | Discharge status: Home / Self Care | Payer: Medicare Other | Attending: Emergency Medicine | Admitting: Emergency Medicine

## 2022-06-04 DIAGNOSIS — R42 Dizziness and giddiness: Secondary | ICD-10-CM | POA: Diagnosis not present

## 2022-06-04 DIAGNOSIS — R002 Palpitations: Secondary | ICD-10-CM | POA: Insufficient documentation

## 2022-06-04 DIAGNOSIS — Z91040 Latex allergy status: Secondary | ICD-10-CM | POA: Diagnosis not present

## 2022-06-04 DIAGNOSIS — R519 Headache, unspecified: Secondary | ICD-10-CM | POA: Diagnosis present

## 2022-06-04 DIAGNOSIS — Z79899 Other long term (current) drug therapy: Secondary | ICD-10-CM | POA: Insufficient documentation

## 2022-06-04 LAB — COMPREHENSIVE METABOLIC PANEL WITH GFR
ALT: 16 U/L (ref 0–44)
AST: 23 U/L (ref 15–41)
Albumin: 3.9 g/dL (ref 3.5–5.0)
Alkaline Phosphatase: 75 U/L (ref 38–126)
Anion gap: 6 (ref 5–15)
BUN: 14 mg/dL (ref 8–23)
CO2: 25 mmol/L (ref 22–32)
Calcium: 8.5 mg/dL — ABNORMAL LOW (ref 8.9–10.3)
Chloride: 105 mmol/L (ref 98–111)
Creatinine, Ser: 0.8 mg/dL (ref 0.61–1.24)
GFR, Estimated: >60 mL/min
Glucose, Bld: 144 mg/dL — ABNORMAL HIGH (ref 70–99)
Potassium: 4 mmol/L (ref 3.5–5.1)
Sodium: 136 mmol/L (ref 135–145)
Total Bilirubin: 0.9 mg/dL (ref 0.3–1.2)
Total Protein: 7 g/dL (ref 6.5–8.1)

## 2022-06-04 LAB — GLUCOSE, CAPILLARY: Glucose-Capillary: 168 mg/dL — ABNORMAL HIGH (ref 70–99)

## 2022-06-04 LAB — URINALYSIS, ROUTINE W REFLEX MICROSCOPIC
Bilirubin Urine: NEGATIVE
Glucose, UA: NEGATIVE mg/dL
Ketones, ur: NEGATIVE mg/dL
Leukocytes,Ua: NEGATIVE
Nitrite: NEGATIVE
Protein, ur: NEGATIVE mg/dL
Specific Gravity, Urine: 1.02 (ref 1.005–1.030)
pH: 6 (ref 5.0–8.0)

## 2022-06-04 LAB — CBC WITH DIFFERENTIAL/PLATELET
Abs Immature Granulocytes: 0.04 10*3/uL (ref 0.00–0.07)
Basophils Absolute: 0.1 10*3/uL (ref 0.0–0.1)
Basophils Relative: 1 %
Eosinophils Absolute: 0.1 10*3/uL (ref 0.0–0.5)
Eosinophils Relative: 2 %
HCT: 40.9 % (ref 39.0–52.0)
Hemoglobin: 14.5 g/dL (ref 13.0–17.0)
Immature Granulocytes: 1 %
Lymphocytes Relative: 14 %
Lymphs Abs: 1.2 10*3/uL (ref 0.7–4.0)
MCH: 32.1 pg (ref 26.0–34.0)
MCHC: 35.5 g/dL (ref 30.0–36.0)
MCV: 90.5 fL (ref 80.0–100.0)
Monocytes Absolute: 0.4 10*3/uL (ref 0.1–1.0)
Monocytes Relative: 5 %
Neutro Abs: 6.3 10*3/uL (ref 1.7–7.7)
Neutrophils Relative %: 77 %
Platelets: 200 10*3/uL (ref 150–400)
RBC: 4.52 MIL/uL (ref 4.22–5.81)
RDW: 12.7 % (ref 11.5–15.5)
WBC: 8.1 10*3/uL (ref 4.0–10.5)
nRBC: 0 % (ref 0.0–0.2)

## 2022-06-04 LAB — URINALYSIS, MICROSCOPIC (REFLEX): WBC, UA: NONE SEEN WBC/hpf (ref 0–5)

## 2022-06-04 LAB — TROPONIN I (HIGH SENSITIVITY): Troponin I (High Sensitivity): 4 ng/L (ref <18)

## 2022-06-04 NOTE — ED Provider Notes (Signed)
Cesar [name]   CSN: [id number] Arrival date & time: 06/04/22  1300     History  Chief Complaint  Patient presents with   Dizziness    Cesar [name] is a 76 y.o. male.  HPI     76yo male with history of PTSD, RSD, vertigo, under evaluation for abdominal pain with negative CT in August, EGD with candidal esophagitis, scheduled for presents with concern for 3 days of headaches and blurred vision.  Dizziness feels like prior vertigo, just feels like it is coming from headaches, has those ongoing. Not off balance, not feeling like going to pass out, not room spining. Hx migraine will hold on for 3-4 days, doesn't keep him from doing things just aggravating.  Feels like half and half like a migraine, waxing and waning.  Even though taking medicine from Dr. Nani Ravens, this one help on longer than normal, but thinks may be that.   Vision more like everything blurry in both eyes, has been 2-3 days, mild. Nothing makes it better or worse.  No double vision, no visual field deficints, no missing pieces of vision.  Going to gastroenterologist in [place] because not eating like he needs to, thought food backing up because of hiatal hernia.   Feel bloated all of the time, can't eat what want or need, drinks premiere shakes, protein shakes for 5-7 months.  Saw GI.  [facility].  Going to have another test manometry.   No numbness/weakness, no trouble walking or talking.  Use cane chronically   Having a little bit of palpitations, heart going fast and slow, breathing off a little bit. Heart does that quite a bit thought was from anxiety.   Brain mass? On MRI with Dr. Nani Ravens for headaches   [facility] No hx of htn, chl, DM, no smoking, etoh or other drugs  Was heading to [place] and called, told him to come to ED  No fever, no black or bloody stools Stays nausea all the time, some chronic abd pain   Past Medical History:   Diagnosis Date   Allergy    Angina at rest [facility])    Arthritis    Back pain, lumbosacral    Back pain, thoracic    Depression    Eczema    GERD (gastroesophageal reflux disease)    Prostatitis    RSD (reflex sympathetic dystrophy)    on the right side   Vertigo     Home Medications Prior to Admission medications   Medication Sig Start Date End Date Taking? Authorizing Provider  Calcium-Vitamin D 600-200 MG-UNIT tablet Take by mouth.    [provider]  dicyclomine (BENTYL) 10 MG capsule Take 1 capsule (10 mg total) by mouth every 6 (six) hours as needed (for abdominal cramping/ discomfort). 05/15/22   Cirigliano, Vito V, DO  fluconazole (DIFLUCAN) 200 MG tablet Take 1 tablet (200 mg total) by mouth daily. 400 mg. For one dose, then 200 mg. Daily for 3 weeks. 05/01/22   Cirigliano, Vito V, DO  lovastatin (MEVACOR) 10 MG tablet TAKE 1 TABLET BY MOUTH AT BEDTIME 03/04/22   Wendling, Crosby Oyster, DO  metoprolol tartrate (LOPRESSOR) 25 MG tablet TAKE 1/2 TABLET BY MOUTH TWICE DAILY 03/25/22   Shelda Pal, DO  midodrine (PROAMATINE) 5 MG tablet Take 1 tablet (5 mg total) by mouth 2 (two) times daily with a meal. 02/26/22   Wendling, Crosby Oyster, DO  nitroGLYCERIN (NITROSTAT) 0.4 MG SL  tablet Place 1 tablet (0.4 mg total) under the tongue every 5 (five) minutes as needed for chest pain. Patient not taking: Reported on 04/13/2022 02/05/21   Shelda Pal, DO  ondansetron (ZOFRAN-ODT) 4 MG disintegrating tablet Take 1 tablet (4 mg total) by mouth every 8 (eight) hours as needed for nausea or vomiting. 01/12/22   Shelda Pal, DO  pantoprazole (PROTONIX) 40 MG tablet Take 1 tablet (40 mg total) by mouth 2 (two) times daily. 05/01/22   Cirigliano, Vito V, DO  polyethylene glycol (MIRALAX / GLYCOLAX) packet 17 g by Does not apply route.    [provider]  prochlorperazine (COMPAZINE) 5 MG tablet Take 1 tablet (5 mg total) by mouth every 6 (six) hours as  needed for nausea or vomiting. 01/19/22   Nani Ravens, Crosby Oyster, DO  rizatriptan (MAXALT) 10 MG tablet Take 1 tablet (10 mg total) by mouth as needed for migraine. May repeat in 2 hours if needed Patient not taking: Reported on 04/13/2022 03/13/22   Shelda Pal, DO  sertraline (ZOLOFT) 50 MG tablet TAKE ONE TABLET BY MOUTH IN THE MORNING FOR PTSD, ANXIETY, AND DEPRESSION-- DISCONTINUE FLUOXETINE (PROZAC) 01/27/22   [provider]  triamcinolone cream (KENALOG) 0.1 % Apply to affected areas twice daily as needed 09/27/15   [provider]  Zoster Vaccine Adjuvanted [facility]) injection Inject into the muscle. 11/11/21   Carlyle Basques, MD      Allergies    Formaldehyde, Latex, Sulfa antibiotics, Butorphanol, Celecoxib, Hydrocodone-acetaminophen, Metformin, Oxycodone-acetaminophen, Povidone iodine, Metformin hcl, Simvastatin, Tamsulosin, Nifedipine, Nitroglycerin, Povidone-iodine, Quaternium-15, Rofecoxib, and Tape    Review of Systems   Review of Systems  Physical Exam Updated Vital Signs BP 121/85   Pulse 65   Temp 97.9 F (36.6 C) (Oral)   Resp 15   Ht 6' (1.829 m)   Wt 90.7 kg   SpO2 99%   BMI 27.12 kg/m  Physical Exam Vitals and nursing [name] reviewed.  Constitutional:      General: He is not in acute distress.    Appearance: Normal appearance. He is well-developed. He is not ill-appearing or diaphoretic.  HENT:     Head: Normocephalic and atraumatic.  Eyes:     General: No visual field deficit.    Extraocular Movements: Extraocular movements intact.     Conjunctiva/sclera: Conjunctivae normal.     Pupils: Pupils are equal, round, and reactive to light.  Cardiovascular:     Rate and Rhythm: Normal rate and regular rhythm.     Pulses: Normal pulses.     Heart sounds: Normal heart sounds. No murmur heard.    No friction rub. No gallop.  Pulmonary:     Effort: Pulmonary effort is normal. No respiratory distress.     Breath sounds: Normal breath  sounds. No wheezing or rales.  Abdominal:     General: There is no distension.     Palpations: Abdomen is soft.     Tenderness: There is no abdominal tenderness. There is no guarding.  Musculoskeletal:        General: No swelling or tenderness.     Cervical back: Normal range of motion.  Skin:    General: Skin is warm and dry.     Findings: No erythema or rash.  Neurological:     General: No focal deficit present.     Mental Status: He is alert and oriented to person, place, and time.     GCS: GCS eye subscore is 4. GCS verbal  subscore is 5. GCS motor subscore is 6.     Cranial Nerves: No cranial nerve deficit, dysarthria or facial asymmetry.     Sensory: No sensory deficit.     Motor: No weakness or tremor.     Coordination: Coordination normal. Finger-Nose-Finger Test normal.     Gait: Gait normal.     ED Results / Procedures / Treatments   Labs (all labs ordered are listed, but only abnormal results are displayed) Labs Reviewed  URINALYSIS, ROUTINE W REFLEX MICROSCOPIC - Abnormal; Notable for the following components:      Result Value   Hgb urine dipstick TRACE (*)    All other components within normal limits  COMPREHENSIVE METABOLIC PANEL - Abnormal; Notable for the following components:   Glucose, Bld 144 (*)    Calcium 8.5 (*)    All other components within normal limits  GLUCOSE, CAPILLARY - Abnormal; Notable for the following components:   Glucose-Capillary 168 (*)    All other components within normal limits  URINALYSIS, MICROSCOPIC (REFLEX) - Abnormal; Notable for the following components:   Bacteria, UA RARE (*)    All other components within normal limits  CBC WITH DIFFERENTIAL/PLATELET  CBG MONITORING, ED  TROPONIN I (HIGH SENSITIVITY)  TROPONIN I (HIGH SENSITIVITY)    EKG EKG Interpretation  Date/Time:  Thursday June 04 2022 13:15:36 EDT Ventricular Rate:  68 PR Interval:  198 QRS Duration: 100 QT Interval:  406 QTC Calculation: 431 R  Axis:   -62 Text Interpretation: Normal sinus rhythm Pulmonary disease pattern Incomplete right bundle branch block Left anterior fascicular block Abnormal ECG When compared with ECG of 25-Nov-2021 15:58, No significant change since last tracing Confirmed by Alvira Monday ([id number]) on 06/04/2022 2:16:53 PM  Radiology DG Chest 2 View  Result Date: 06/04/2022 CLINICAL DATA:  Shortness of breath for 2 days.  Blurred vision. EXAM: CHEST - 2 VIEW COMPARISON:  Chest x-ray dated 06/02/2019 FINDINGS: Heart size and mediastinal contours are stable, with stable appearance of the previously described tortuous configuration of the thoracic aorta. Lungs are clear. No pleural effusion or pneumothorax is seen. Degenerative spondylosis of the thoracic spine, mild to moderate in degree. No acute-appearing osseous abnormality. IMPRESSION: No active cardiopulmonary disease. No evidence of pneumonia or pulmonary edema. Electronically Signed   By: Bary Richard M.D.   On: 06/04/2022 13:53   CT HEAD WO CONTRAST  Result Date: 06/04/2022 CLINICAL DATA:  Headache, chronic, new features or increased frequency EXAM: CT HEAD WITHOUT CONTRAST TECHNIQUE: Contiguous axial images were obtained from the base of the skull through the vertex without intravenous contrast. RADIATION DOSE REDUCTION: This exam was performed according to the departmental dose-optimization program which includes automated exposure control, adjustment of the mA and/or kV according to patient size and/or use of iterative reconstruction technique. COMPARISON:  None Available. FINDINGS: Brain: No evidence of acute large vascular territory infarction, hemorrhage, hydrocephalus, extra-axial collection or mass lesion/mass effect. Vascular: No hyperdense vessel identified. Skull: No acute fracture. Sinuses/Orbits: Clear sinuses.  No acute orbital findings. Other: No mastoid effusions. IMPRESSION: No evidence of acute intracranial abnormality. Electronically Signed   By:  Feliberto Harts M.D.   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  DDX includes ICH, CVA, TIA, electrolyte abnormality, anemia, cardiac rhythm abnormality, infection, ACS.  CT head completed showing no evidence of ICH or acute intracranial abnormalities.  Denies sensation of vertigo/off balance, ambulates with cane at baseline, has some baseline dizziness, no coordination abnormalities on finger to nose, normal visual fields and EOM.  Do not see signs of CVA on exam, and his history symptoms do appear to be more chronic.  Able to read words across the room, no monocular vision changes, do not suspect intraocular emergency. Discussed option to be transferred to [name] for MRI< however low suspicion for acute CVA by history and exam and continued outpt follow up appropriately.   Labs completed and personally evaluated by me show no evidence of UTI, no anemia or leukocytosis, normal electroltyes, troponin WNL.  CXR withotu pneumonia, pneumothorax or fluid.   Also descries brief palpitations and dyspnea.  EKG without significant change in comparison to prior. Recommend outpatient monitoring with strict return precautions.          Final Clinical Impression(s) / ED Diagnoses Final diagnoses:  Acute nonintractable headache, unspecified headache type  Palpitations    Rx / DC Orders ED Discharge Orders     None         Gareth Morgan, MD 06/05/22 0001

## 2022-06-04 NOTE — ED Triage Notes (Signed)
Pt to ED c/o dizziness x 3 days and blurred vision that started at noon yesterday.  hx vertigo . Ambulatory in triage with cane.

## 2022-06-04 NOTE — ED Notes (Signed)
Pt warned about  NOT driving while dizzy

## 2022-06-06 ENCOUNTER — Encounter: Payer: Self-pay | Admitting: Family Medicine

## 2022-06-09 ENCOUNTER — Ambulatory Visit (INDEPENDENT_AMBULATORY_CARE_PROVIDER_SITE_OTHER): Payer: Medicare Other | Admitting: Family Medicine

## 2022-06-09 ENCOUNTER — Encounter: Payer: Self-pay | Admitting: Family Medicine

## 2022-06-09 VITALS — BP 108/67 | HR 68 | Temp 97.9°F | Ht 71.0 in | Wt 211.4 lb

## 2022-06-09 DIAGNOSIS — T50905A Adverse effect of unspecified drugs, medicaments and biological substances, initial encounter: Secondary | ICD-10-CM | POA: Diagnosis not present

## 2022-06-09 DIAGNOSIS — Z23 Encounter for immunization: Secondary | ICD-10-CM

## 2022-06-09 NOTE — Addendum Note (Signed)
Addended by: Sharon Seller B on: 06/09/2022 02:39 PM   Modules accepted: Orders

## 2022-06-09 NOTE — Patient Instructions (Addendum)
Stay away from the Seroquel.   If symptoms return and you have not gone back on the medicine, seek care or let us know.   Stay hydrated.   Let us know if you need anything.

## 2022-06-09 NOTE — Progress Notes (Signed)
Chief Complaint  Patient presents with   ER followup    Subjective: Patient is a 76 y.o. male here for ED f/u.  Seen for blurred vision and palpitations. Found it was due to Seroquel, stopped and his AE's resolved. ER workup was neg. He has had no issues since then. He stays hydrated.   Past Medical History:  Diagnosis Date   Allergy    Angina at rest    Arthritis    Back pain, lumbosacral    Back pain, thoracic    Depression    Eczema    GERD (gastroesophageal reflux disease)    Prostatitis    RSD (reflex sympathetic dystrophy)    on the right side   Vertigo     Objective: BP 108/67 (BP Location: Left Arm, Patient Position: Sitting, Cuff Size: Normal)   Pulse 68   Temp 97.9 F (36.6 C) (Oral)   Ht 5\' 11"  (1.803 m)   Wt 211 lb 6 oz (95.9 kg)   SpO2 98%   BMI 29.48 kg/m  General: Awake, appears stated age Heart: RRR, no LE edema Lungs: CTAB, no rales, wheezes or rhonchi. No accessory muscle use Psych: Age appropriate judgment and insight, normal affect and mood  Assessment and Plan: Adverse effect of drug, initial encounter  Stop Seroquel. Let us know if s/s's return despite being off of this, he will let us know or seek care. Flu shot today.  The patient voiced understanding and agreement to the plan.  [place], DO 06/09/22  2:32 PM

## 2022-06-16 ENCOUNTER — Telehealth: Payer: Self-pay | Admitting: Gastroenterology

## 2022-06-16 NOTE — Telephone Encounter (Signed)
High suspicion for esophageal dysmotility as etiology for his dysphagia.  No specific medications that we can prescribe to help with dysmotility, and need to await results of esophageal manometry.  In the meantime, can continue pantoprazole 40 mg twice daily prior to breakfast and dinner as currently doing, and can add Pepcid 20 mg twice daily to potentially assist with some of the heartburn type symptoms.  Ensure he is not eating/drinking close to bedtime, should remain upright for couple hours after any meal, sleep with HOB elevated.

## 2022-06-16 NOTE — Telephone Encounter (Signed)
Spoke with pt and gave pt recommendations. Pt verbalized understanding and had no other concerns at end of call.  

## 2022-06-16 NOTE — Telephone Encounter (Signed)
Pt reports he has difficulty swallowing, heartburn, and a cough. Pt states that he is able to swallow medications with yogurt without difficulty. Ask pt what foods he is having trouble swallowing and pt was unsure. Pt states he has not had any solid foods recently because he is trying to do everything he can do avoid having swallowing difficulty. Pt also reports he is taking pantoprazole 1 hour prior to breakfast and dinner. Pt wanted to know if anything else can be done to help with his symptoms. Pt has Esophageal manometry scheduled on 10/25.

## 2022-06-16 NOTE — Telephone Encounter (Signed)
Inbound call from patient requesting to speak with a nurse , patient states he is having a lot problems swallowing .Please advise.

## 2022-06-24 ENCOUNTER — Other Ambulatory Visit: Payer: Self-pay | Admitting: Family Medicine

## 2022-07-01 ENCOUNTER — Encounter (HOSPITAL_COMMUNITY)
Admission: RE | Disposition: A | Discharge status: Home / Self Care | Payer: Self-pay | Source: Ambulatory Visit | Attending: Gastroenterology

## 2022-07-01 ENCOUNTER — Ambulatory Visit: Payer: Medicare Other | Admitting: Gastroenterology

## 2022-07-01 ENCOUNTER — Ambulatory Visit (HOSPITAL_COMMUNITY)
Admission: RE | Admit: 2022-07-01 | Discharge: 2022-07-01 | Disposition: A | Discharge status: Home / Self Care | Payer: Medicare Other | Source: Ambulatory Visit | Attending: Gastroenterology | Admitting: Gastroenterology

## 2022-07-01 DIAGNOSIS — R1310 Dysphagia, unspecified: Secondary | ICD-10-CM

## 2022-07-01 HISTORY — PX: ESOPHAGEAL MANOMETRY: SHX5429

## 2022-07-01 SURGERY — MANOMETRY, ESOPHAGUS

## 2022-07-01 MED ORDER — LIDOCAINE VISCOUS HCL 2 % MT SOLN
OROMUCOSAL | Status: AC
  Filled 2022-07-01: qty 15

## 2022-07-01 SURGICAL SUPPLY — 2 items
FACESHIELD LNG OPTICON STERILE (SAFETY) IMPLANT
GLOVE BIO SURGEON STRL SZ8 (GLOVE) ×2 IMPLANT

## 2022-07-01 NOTE — Progress Notes (Signed)
Esophageal Manometry done per protocol. Patient tolerated well without distress or complication.  

## 2022-07-03 ENCOUNTER — Encounter (HOSPITAL_COMMUNITY): Payer: Self-pay | Admitting: Gastroenterology

## 2022-07-07 DIAGNOSIS — R1310 Dysphagia, unspecified: Secondary | ICD-10-CM

## 2022-07-08 ENCOUNTER — Other Ambulatory Visit (HOSPITAL_BASED_OUTPATIENT_CLINIC_OR_DEPARTMENT_OTHER): Payer: Self-pay

## 2022-07-08 ENCOUNTER — Encounter: Payer: Self-pay | Admitting: Family Medicine

## 2022-07-08 ENCOUNTER — Ambulatory Visit (INDEPENDENT_AMBULATORY_CARE_PROVIDER_SITE_OTHER): Payer: Medicare Other | Admitting: Family Medicine

## 2022-07-08 VITALS — BP 120/80 | HR 88 | Temp 98.0°F | Ht 71.0 in | Wt 213.2 lb

## 2022-07-08 DIAGNOSIS — F411 Generalized anxiety disorder: Secondary | ICD-10-CM

## 2022-07-08 DIAGNOSIS — F4310 Post-traumatic stress disorder, unspecified: Secondary | ICD-10-CM

## 2022-07-08 MED ORDER — ALPRAZOLAM 0.5 MG PO TABS: 0.5000 mg | ORAL_TABLET | Freq: Three times a day (TID) | ORAL | 1 refills | Status: DC | PRN

## 2022-07-08 MED ORDER — FLUOXETINE HCL 20 MG PO TABS: 20.0000 mg | ORAL_TABLET | Freq: Every day | ORAL | 2 refills | Status: DC

## 2022-07-08 MED ORDER — COMIRNATY 30 MCG/0.3ML IM SUSY
PREFILLED_SYRINGE | INTRAMUSCULAR | 0 refills | Status: DC
  Filled 2022-07-08: qty 0.3, 1d supply, fill #0

## 2022-07-08 NOTE — Progress Notes (Signed)
Chief Complaint  Patient presents with   Follow-up    Discuss Psy. Medications and not satisfied with his Psy at the [place].     Subjective Cesar [name] presents for f/u anxiety/PTSD.  Pt is currently being treated with sertraline 150 mg/d, hydroxyzine prn.  Reports not doing well since treatment.  He is frustrated with the psychiatric treatment.  With me, he has tried various medications in an effort to get off of the alprazolam which he is currently not taking.  He is having trouble leaving the house and doing things due to anxiety and PTSD issues. No thoughts of harming self or others. No self-medication with alcohol, prescription drugs or illicit drugs. Pt is following with a counselor/psychologist.  Past Medical History:  Diagnosis Date   Allergy    Angina at rest    Arthritis    Back pain, lumbosacral    Back pain, thoracic    Depression    Eczema    GERD (gastroesophageal reflux disease)    Prostatitis    RSD (reflex sympathetic dystrophy)    on the right side   Vertigo    Allergies as of 07/08/2022       Reactions   Formaldehyde Itching, Rash   SOLN   Latex Itching   Sulfa Antibiotics Itching, Nausea And Vomiting, Rash   15 POWD SOLN   Butorphanol Other (See Comments)   Drowsiness Drowsy Drowsiness   Celecoxib Rash   Flushing Flushing   Hydrocodone-acetaminophen Other (See Comments), Nausea And Vomiting   Metformin Other (See Comments)   Unknown   Oxycodone-acetaminophen Nausea And Vomiting   Povidone Iodine Rash   Metformin Hcl Other (See Comments)   *blurred vision, 11/06/2009   Simvastatin Other (See Comments)   Myalgias   Tamsulosin Other (See Comments)   Dyspnea   Nifedipine Nausea Only   Nitroglycerin Rash   Nitroglycerin patch developed rash and blisters from adhesive and latex   Povidone-iodine Rash   Quaternium-15 Rash   Rofecoxib Nausea Only   Flushing   Tape Itching, Rash        Medication List        Accurate as of July 08, 2022  1:42 PM. If you have any questions, ask your nurse or doctor.          STOP taking these medications    sertraline 100 MG tablet Commonly known as: ZOLOFT       TAKE these medications    ALPRAZolam 0.5 MG tablet Commonly known as: XANAX Take 1 tablet (0.5 mg total) by mouth 3 (three) times daily as needed for anxiety.   Calcium-Vitamin D 600-200 MG-UNIT tablet Take by mouth.   dicyclomine 10 MG capsule Commonly known as: BENTYL Take 1 capsule (10 mg total) by mouth every 6 (six) hours as needed (for abdominal cramping/ discomfort).   fluconazole 200 MG tablet Commonly known as: Diflucan Take 1 tablet (200 mg total) by mouth daily. 400 mg. For one dose, then 200 mg. Daily for 3 weeks.   FLUoxetine 20 MG tablet Commonly known as: PROZAC Take 1 tablet (20 mg total) by mouth daily.   hydrOXYzine 25 MG capsule Commonly known as: VISTARIL TAKE ONE CAPSULE BY MOUTH THREE TIMES A DAY AS NEEDED FOR ANXIETY   lovastatin 10 MG tablet Commonly known as: MEVACOR TAKE 1 TABLET BY MOUTH AT BEDTIME   metoprolol tartrate 25 MG tablet Commonly known as: LOPRESSOR TAKE 1/2 TABLET BY MOUTH TWICE DAILY   midodrine 5 MG  tablet Commonly known as: PROAMATINE Take 1 tablet (5 mg total) by mouth 2 (two) times daily with a meal.   nitroGLYCERIN 0.4 MG SL tablet Commonly known as: NITROSTAT Place 1 tablet (0.4 mg total) under the tongue every 5 (five) minutes as needed for chest pain.   ondansetron 4 MG disintegrating tablet Commonly known as: ZOFRAN-ODT Take 1 tablet (4 mg total) by mouth every 8 (eight) hours as needed for nausea or vomiting.   pantoprazole 40 MG tablet Commonly known as: PROTONIX Take 1 tablet (40 mg total) by mouth 2 (two) times daily.   polyethylene glycol 17 g packet Commonly known as: MIRALAX / GLYCOLAX 17 g by Does not apply route.   prochlorperazine 5 MG tablet Commonly known as: COMPAZINE Take 1 tablet (5 mg total) by mouth every 6 (six) hours  as needed for nausea or vomiting.   rizatriptan 10 MG tablet Commonly known as: Maxalt Take 1 tablet (10 mg total) by mouth as needed for migraine. May repeat in 2 hours if needed   Shingrix injection Generic drug: Zoster Vaccine Adjuvanted Inject into the muscle.   triamcinolone cream 0.1 % Commonly known as: KENALOG Apply to affected areas twice daily as needed        Exam BP 120/80 (BP Location: Left Arm, Patient Position: Sitting, Cuff Size: Normal)   Pulse 88   Temp 98 F (36.7 C) (Oral)   Ht 5\' 11"  (1.803 m)   Wt 213 lb 4 oz (96.7 kg)   SpO2 98%   BMI 29.74 kg/m  General:  well developed, well nourished, in no apparent distress Lungs:  No respiratory distress Psych: well oriented with normal range of affect and age-appropriate judgement/insight, alert and oriented x4.  Assessment and Plan  GAD (generalized anxiety disorder) - Plan: FLUoxetine (PROZAC) 20 MG tablet, ALPRAZolam (XANAX) 0.5 MG tablet  PTSD (post-traumatic stress disorder) - Plan: FLUoxetine (PROZAC) 20 MG tablet, ALPRAZolam (XANAX) 0.5 MG tablet  Chronic, uncontrolled. Will change sertraline to Prozac 20 mg/d. Go back on Xanax 0.5 mg TID prn. Cont hydroxyzine 25-75 mg TID prn. He is tolerating well.  He has been compliant and open to changes which have not been effective unfortunately.  I do not particularly care if he follows with a therapist at this time if it is not helpful.  He is free to see a psychiatrist at the [place] if he wishes but I will not mandate this. F/u as originally scheduled in 3 mo. The patient voiced understanding and agreement to the plan.  [place], DO 07/08/22 1:42 PM

## 2022-07-08 NOTE — Patient Instructions (Signed)
Let me know if there are issues with going back to our old combination.  Let us know if you need anything.

## 2022-07-21 ENCOUNTER — Ambulatory Visit: Payer: Medicare Other | Admitting: Gastroenterology

## 2022-07-24 ENCOUNTER — Telehealth: Payer: Self-pay | Admitting: Family Medicine

## 2022-07-24 NOTE — Telephone Encounter (Signed)
Pt called stating that he was having arm/leg weakness after receiving his Covid Booster shot about 1.5 weeks ago. After consulting with [facility] who consulted with Melissa, transferred pt to triage nurse for further eval.

## 2022-08-03 ENCOUNTER — Telehealth: Payer: Self-pay | Admitting: *Deleted

## 2022-08-03 NOTE — Telephone Encounter (Signed)
Who Is Calling Patient / Member / Family / Caregiver Call Type Triage / Clinical Relationship To Patient Self Return Phone Number [phone] (Primary) Chief Complaint Arm Injury Reason for Call Symptomatic / Request for Health Information Initial Comment Caller states that yesterday he obtained an arm injury from a screen door. He has not had any change in behavior since then, but wants to know how to take care of his head wound. Translation No Nurse Assessment Nurse: Agricultural engineer, RN, Cala Bradford Date/Time (Eastern Time): 08/02/2022 4:06:56 PM Confirm and document reason for call. If symptomatic, describe symptoms. ---Caller states that he was injured by the storm door yesterday and it has made a wound on his L arm. States that no other injuries sustained, no head wound.  Final Disposition 08/02/2022 4:19:18 PM Home Care Yes Schreffler, RN, Cala Bradford

## 2022-08-07 ENCOUNTER — Encounter: Payer: Self-pay | Admitting: Family Medicine

## 2022-08-07 ENCOUNTER — Other Ambulatory Visit (INDEPENDENT_AMBULATORY_CARE_PROVIDER_SITE_OTHER): Payer: Medicare Other

## 2022-08-07 ENCOUNTER — Ambulatory Visit (INDEPENDENT_AMBULATORY_CARE_PROVIDER_SITE_OTHER): Payer: Medicare Other | Admitting: Family Medicine

## 2022-08-07 VITALS — BP 122/70 | HR 66 | Temp 97.6°F | Resp 16 | Ht 72.0 in | Wt 218.2 lb

## 2022-08-07 DIAGNOSIS — S41102A Unspecified open wound of left upper arm, initial encounter: Secondary | ICD-10-CM

## 2022-08-07 DIAGNOSIS — Z23 Encounter for immunization: Secondary | ICD-10-CM | POA: Diagnosis not present

## 2022-08-07 NOTE — Progress Notes (Signed)
Chief Complaint  Patient presents with   Wound Check    Here for cut on left hand/ Arm     Cesar [name] is a 76 y.o. male here for a skin complaint.  Duration: 5 days Location: L hand and forearm Pruritic? No Painful? Yes Drainage? No Door hit arm.  Other associated symptoms: swelling; no fevers Therapies tried thus far: TAO  Past Medical History:  Diagnosis Date   Allergy    Angina at rest    Arthritis    Back pain, lumbosacral    Back pain, thoracic    Depression    Eczema    GERD (gastroesophageal reflux disease)    Prostatitis    RSD (reflex sympathetic dystrophy)    on the right side   Vertigo     BP 122/70 (BP Location: Left Arm, Patient Position: Sitting, Cuff Size: Normal)   Pulse 66   Temp 97.6 F (36.4 C) (Oral)   Resp 16   Ht 6' (1.829 m)   Wt 218 lb 3.2 oz (99 kg)   SpO2 97%   BMI 29.59 kg/m  Gen: awake, alert, appearing stated age Lungs: No accessory muscle use Skin: See below. No drainage, erythema, TTP, fluctuance, excoriation Psych: Age appropriate judgment and insight    Wound of left upper extremity, initial encounter  TAO bid for the next week. No signs of infection. Td updated today for wound. Warning signs and symptoms verbalized and written down in AVS.  F/u prn. The patient voiced understanding and agreement to the plan.  [name] [place], DO 08/07/22 12:10 PM

## 2022-08-07 NOTE — Patient Instructions (Signed)
When you do wash it, use only soap and water. Do not vigorously scrub. Apply triple antibiotic ointment (like Neosporin) twice daily. Keep the area clean and dry.   Things to look out for: increasing pain not relieved by ibuprofen/acetaminophen, fevers, spreading redness, drainage of pus, or foul odor.  Let us know if you need anything.  

## 2022-08-11 ENCOUNTER — Ambulatory Visit (INDEPENDENT_AMBULATORY_CARE_PROVIDER_SITE_OTHER): Payer: Medicare Other | Admitting: Gastroenterology

## 2022-08-11 ENCOUNTER — Encounter: Payer: Self-pay | Admitting: Gastroenterology

## 2022-08-11 VITALS — BP 108/78 | HR 55 | Ht 72.0 in | Wt 217.0 lb

## 2022-08-11 DIAGNOSIS — R140 Abdominal distension (gaseous): Secondary | ICD-10-CM | POA: Diagnosis not present

## 2022-08-11 DIAGNOSIS — K219 Gastro-esophageal reflux disease without esophagitis: Secondary | ICD-10-CM | POA: Diagnosis not present

## 2022-08-11 DIAGNOSIS — R1319 Other dysphagia: Secondary | ICD-10-CM

## 2022-08-11 DIAGNOSIS — K449 Diaphragmatic hernia without obstruction or gangrene: Secondary | ICD-10-CM

## 2022-08-11 DIAGNOSIS — K224 Dyskinesia of esophagus: Secondary | ICD-10-CM | POA: Diagnosis not present

## 2022-08-11 DIAGNOSIS — Z86010 Personal history of colonic polyps: Secondary | ICD-10-CM

## 2022-08-11 NOTE — Progress Notes (Signed)
Chief Complaint:    Dysphagia, discuss recent study results, abdominal bloating  GI History: 76 year old male veteran with medical history as outlined below, follows in the GI clinic with following:  - 02/10/2022: Initial evaluation for solid food dysphagia x3-4 months.  Worse with meat and bread. - 02/18/2022: Esophagram: Patulous esophagus without stricture or mass.  Significant esophageal motility disorder with no discernible primary peristaltic waves.  No HH, no reflux.  13 mm barium tablet passed easily.  Prominent cricopharyngeal impression without Zenker's diverticulum - 04/13/2022: Follow-up in GI clinic.  Heartburn resolved with omeprazole.  Continued dysphagia to solids.  Some epigastric pain and bloating - 04/21/2022: CT A/P for epigastric pain.  Normal GI tract.  A couple tiny pulmonary nodules -05/01/2022: EGD: Localized plaques in the mid esophagus (path: Candida esophagitis), 3 cm sliding HH.  Retained fluid in the lower esophagus highly suggestive of stasis.  Esophagus empirically dilated with 17 mm Savary without mucosal rent.  Mild nonulcer, non-H. pylori gastritis.  Normal duodenum.  Changed Prilosec to Protonix 40 mg twice daily x4 weeks, then reduce to 40 mg/day.  Treated Candida esophagitis with Diflucan - 07/01/2022: Esophageal Manometry: Normal EGJ resting pressure with complete relaxation.  100% failed swallows with absent peristalsis and 0/10 bolus clearance, all consistent with severe esophageal dysmotility with increased suspicion for scleroderma (but cannot rule out type I achalasia).  Recommended consideration for upper endoscopy with 20 mm balloon dilation, but not Botox injection due to higher suspicion for scleroderma.   Endoscopic History: - EGD (11/2015, Dr. Loman Chroman): Normal - Colonoscopy (11/2015, Dr. Loman Chroman): 2 mm transverse colon adenoma, moderate size internal hemorrhoids.  Repeat 7 years - EGD (04/2022): Candida esophagitis, 3 cm sliding HH, retained fluid in esophagus  consistent with stasis dysmotility.  Mild gastritis.  Treated with high-dose PPI, Diflucan  HPI:     Patient is a 76 y.o. male presenting to the Gastroenterology Clinic for follow-up.  Was last seen in the office by [facility] 2 months in 04/2022, and since then has completed CT A/P (normal), EGD (Candida esophagitis, esophageal stasis/dysmotility, hiatal hernia), and Esophageal Manometry (severe dysmotility).  Today, he states his main issue is abdominal bloating. Bloating has been present for many years. He is prescribed Bentyl, but does not take this. Bloating has responded to Gas-X in the past, but hasn't taken in a long time.  Seems independent of food types.  No abdominal pain or other associated symptoms.  Otherwise, heartburn well controlled on Protonix.  Still taking 40 mg BID, but hoping to reduce to daily dosing.  Dysphagia seems somewhat better.  He is conscientious of cutting food into small pieces, chewing thoroughly, and plenty of fluids with meals.  Also avoiding steak and certain other solid foods.   Review of systems:     No chest pain, no SOB, no fevers, no urinary sx   Past Medical History:  Diagnosis Date   Allergy    Angina at rest    Arthritis    Back pain, lumbosacral    Back pain, thoracic    Depression    Eczema    GERD (gastroesophageal reflux disease)    Prostatitis    RSD (reflex sympathetic dystrophy)    on the right side   Vertigo     Patient's surgical history, family medical history, social history, medications and allergies were all reviewed in Epic    Current Outpatient Medications  Medication Sig Dispense Refill   ALPRAZolam (XANAX) 0.5 MG tablet Take 1 tablet (0.5  mg total) by mouth 3 (three) times daily as needed for anxiety. 270 tablet 1   Calcium-Vitamin D 600-200 MG-UNIT tablet Take by mouth.     COVID-19 mRNA vaccine 2023-2024 (COMIRNATY) syringe Inject into the muscle. 0.3 mL 0   dicyclomine (BENTYL) 10 MG capsule Take 1 capsule (10 mg total)  by mouth every 6 (six) hours as needed (for abdominal cramping/ discomfort). 120 capsule 0   fluconazole (DIFLUCAN) 200 MG tablet Take 1 tablet (200 mg total) by mouth daily. 400 mg. For one dose, then 200 mg. Daily for 3 weeks. 23 tablet 0   FLUoxetine (PROZAC) 20 MG tablet Take 1 tablet (20 mg total) by mouth daily. 90 tablet 2   hydrOXYzine (VISTARIL) 25 MG capsule TAKE ONE CAPSULE BY MOUTH THREE TIMES A DAY AS NEEDED FOR ANXIETY     lovastatin (MEVACOR) 10 MG tablet TAKE 1 TABLET BY MOUTH AT BEDTIME 30 tablet 2   metoprolol tartrate (LOPRESSOR) 25 MG tablet TAKE 1/2 TABLET BY MOUTH TWICE DAILY 90 tablet 0   midodrine (PROAMATINE) 5 MG tablet Take 1 tablet (5 mg total) by mouth 2 (two) times daily with a meal. 180 tablet 1   nitroGLYCERIN (NITROSTAT) 0.4 MG SL tablet Place 1 tablet (0.4 mg total) under the tongue every 5 (five) minutes as needed for chest pain. 30 tablet 2   ondansetron (ZOFRAN-ODT) 4 MG disintegrating tablet Take 1 tablet (4 mg total) by mouth every 8 (eight) hours as needed for nausea or vomiting. 20 tablet 0   pantoprazole (PROTONIX) 40 MG tablet Take 1 tablet (40 mg total) by mouth 2 (two) times daily. 90 tablet 3   polyethylene glycol (MIRALAX / GLYCOLAX) packet 17 g by Does not apply route.     prochlorperazine (COMPAZINE) 5 MG tablet Take 1 tablet (5 mg total) by mouth every 6 (six) hours as needed for nausea or vomiting. 30 tablet 0   rizatriptan (MAXALT) 10 MG tablet Take 1 tablet (10 mg total) by mouth as needed for migraine. May repeat in 2 hours if needed 10 tablet 2   triamcinolone cream (KENALOG) 0.1 % Apply to affected areas twice daily as needed     Zoster Vaccine Adjuvanted [facility]) injection Inject into the muscle. 1 each 1   Current Facility-Administered Medications  Medication Dose Route Frequency Provider Last Rate Last Admin   0.9 %  sodium chloride infusion  500 mL Intravenous Once Jhovanny Guinta V, DO        Physical Exam:     Pulse (!) 55   Ht  6' (1.829 m)   Wt 217 lb (98.4 kg)   BMI 29.43 kg/m   GENERAL:  Pleasant male in NAD PSYCH: : Cooperative, normal affect NEURO: Alert and oriented x 3   IMPRESSION and PLAN:    1) Dysphagia 2) Esophageal dysmotility - Continue lifestyle/dietary modifications to include cutting food in small pieces, chewing thoroughly, drinking plenty of fluids with meals.  Encouraged to continue sipping fluids after each bite.  To remain upright for at least 2 hours after all mealtimes - Discussed the role/utility of repeat upper endoscopy with 20 mm TTS balloon dilation if symptoms return/progress.  Lower yield and Botox injection based on motility results  3) GERD 4) Hiatal hernia Reflux symptoms well controlled on current therapy - Try reducing Protonix to 40 mg daily.  Will remove predinner dose - Continue lifestyle/dietary modifications as above  5) Abdominal bloating Longstanding history of intermittent abdominal bloating for many years. -  Trial Beano (OTC simethicone) - If no improvement, can either consider trial of rifaximin, or possibly UGI series to evaluate for small bowel diverticula, small bowel dysmotility, etc. - Depending on response, may also benefit from low FODMAP diet  6) History of colon polyps Last colonoscopy in 2017 with 2 mm adenoma.  Briefly discussed timing of repeat colonoscopy today.  Electing to defer for the time being - Possibly colonoscopy in 2024.  Will discuss again at follow-up   RTC in 6-12 months or sooner as needed  I spent 30 minutes of time, including in depth chart review, independent review of results as outlined above, communicating results with the patient directly, face-to-face time with the patient, coordinating care, and ordering studies and medications as appropriate, and documentation.           Shellia Cleverly ,DO, FACG 08/11/2022, 10:57 AM

## 2022-08-11 NOTE — Patient Instructions (Addendum)
Please follow up in 6 to 12 months. Give [place] a call at [phone] to schedule an appointment.   Take Beano as needed for bloating.  If you are age 75 or older, your body mass index should be between 23-30. Your Body mass index is 29.43 kg/m. If this is out of the aforementioned range listed, please consider follow up with your Primary Care Provider.  __________________________________________________________  The [facility] providers would like to encourage you to use [facility] to communicate with providers for non-urgent requests or questions.  Due to long hold times on the telephone, sending your provider a message by [facility] may be a faster and more efficient way to get a response.  Please allow 48 business hours for a response.  Please remember that this is for non-urgent requests.   Due to recent changes in healthcare laws, you may see the results of your imaging and laboratory studies on MyChart before your provider has had a chance to review them.  We understand that in some cases there may be results that are confusing or concerning to you. Not all laboratory results come back in the same time frame and the provider may be waiting for multiple results in order to interpret others.  Please give [place] 48 hours in order for your provider to thoroughly review all the results before contacting the office for clarification of your results.     Thank you for choosing me and [facility].  Vito Cirigliano, D.O.

## 2022-08-19 ENCOUNTER — Other Ambulatory Visit: Payer: Self-pay | Admitting: Family Medicine

## 2022-08-21 ENCOUNTER — Other Ambulatory Visit: Payer: Self-pay | Admitting: Family Medicine

## 2022-08-21 DIAGNOSIS — G939 Disorder of brain, unspecified: Secondary | ICD-10-CM

## 2022-09-02 ENCOUNTER — Ambulatory Visit (HOSPITAL_BASED_OUTPATIENT_CLINIC_OR_DEPARTMENT_OTHER)
Admission: RE | Admit: 2022-09-02 | Discharge: 2022-09-02 | Disposition: A | Discharge status: Home / Self Care | Payer: Medicare Other | Source: Ambulatory Visit | Attending: Family Medicine | Admitting: Family Medicine

## 2022-09-02 DIAGNOSIS — G939 Disorder of brain, unspecified: Secondary | ICD-10-CM | POA: Diagnosis present

## 2022-09-02 MED ORDER — GADOBUTROL 1 MMOL/ML IV SOLN
10.0000 mL | Freq: Once | INTRAVENOUS | Status: AC | PRN
  Administered 2022-09-02: 10 mL via INTRAVENOUS

## 2022-09-04 ENCOUNTER — Other Ambulatory Visit: Payer: Self-pay | Admitting: Family Medicine

## 2022-09-14 ENCOUNTER — Other Ambulatory Visit: Payer: Self-pay | Admitting: Family Medicine

## 2022-09-22 ENCOUNTER — Other Ambulatory Visit: Payer: Self-pay | Admitting: Family Medicine

## 2022-09-22 DIAGNOSIS — F4310 Post-traumatic stress disorder, unspecified: Secondary | ICD-10-CM

## 2022-09-22 DIAGNOSIS — F411 Generalized anxiety disorder: Secondary | ICD-10-CM

## 2022-10-14 ENCOUNTER — Encounter: Payer: Self-pay | Admitting: Family Medicine

## 2022-10-14 ENCOUNTER — Ambulatory Visit (INDEPENDENT_AMBULATORY_CARE_PROVIDER_SITE_OTHER): Payer: Medicare Other | Admitting: Family Medicine

## 2022-10-14 VITALS — BP 120/69 | HR 67 | Temp 97.9°F | Ht 72.0 in | Wt 223.5 lb

## 2022-10-14 DIAGNOSIS — F4310 Post-traumatic stress disorder, unspecified: Secondary | ICD-10-CM | POA: Diagnosis not present

## 2022-10-14 DIAGNOSIS — E785 Hyperlipidemia, unspecified: Secondary | ICD-10-CM | POA: Insufficient documentation

## 2022-10-14 DIAGNOSIS — Z79899 Other long term (current) drug therapy: Secondary | ICD-10-CM | POA: Diagnosis not present

## 2022-10-14 NOTE — Patient Instructions (Signed)
Give us 2-3 business days to get the results of your labs back.   Keep the diet clean and stay active.  Let us know if you need anything. 

## 2022-10-14 NOTE — Progress Notes (Signed)
Chief Complaint  Patient presents with   Follow-up    6 month     Subjective Cesar [name] presents for f/u PTSD.  Pt is currently being treated with Xanax 0.5 mg TID, Prozac 20 mg/d, hydroxyzine 25 mg TID prn.  Reports doing well since treatment. No thoughts of harming self or others. No self-medication with alcohol, prescription drugs or illicit drugs. Pt is following with a counselor/psychologist.  Hyperlipidemia Patient presents for dyslipidemia follow up. Currently being treated with lovastatin 10 mg/d and compliance with treatment thus far has been good. He denies myalgias. He is usually adhering to a healthy diet. Exercise: walking, wt resistance exercise The patient is not known to have coexisting coronary artery disease.  Past Medical History:  Diagnosis Date   Allergy    Angina at rest    Arthritis    Back pain, lumbosacral    Back pain, thoracic    Depression    Eczema    GERD (gastroesophageal reflux disease)    Prostatitis    RSD (reflex sympathetic dystrophy)    on the right side   Vertigo    Allergies as of 10/14/2022       Reactions   Formaldehyde Itching, Rash   SOLN   Latex Itching   Sulfa Antibiotics Itching, Nausea And Vomiting, Rash   15 POWD SOLN   Butorphanol Other (See Comments)   Drowsiness Drowsy Drowsiness   Celecoxib Rash   Flushing Flushing   Hydrocodone-acetaminophen Other (See Comments), Nausea And Vomiting   Metformin Other (See Comments)   Unknown   Oxycodone-acetaminophen Nausea And Vomiting   Povidone Iodine Rash   Metformin Hcl Other (See Comments)   *blurred vision, 11/06/2009   Simvastatin Other (See Comments)   Myalgias   Tamsulosin Other (See Comments)   Dyspnea   Nifedipine Nausea Only   Nitroglycerin Rash   Nitroglycerin patch developed rash and blisters from adhesive and latex   Povidone-iodine Rash   Quaternium-15 Rash   Rofecoxib Nausea Only   Flushing   Tape Itching, Rash        Medication List         Accurate as of October 14, 2022  2:28 PM. If you have any questions, ask your nurse or doctor.          STOP taking these medications    Comirnaty syringe Generic drug: COVID-19 mRNA vaccine 2023-2024   fluconazole 200 MG tablet Commonly known as: Diflucan   midodrine 5 MG tablet Commonly known as: PROAMATINE   ondansetron 4 MG disintegrating tablet Commonly known as: ZOFRAN-ODT   polyethylene glycol 17 g packet Commonly known as: MIRALAX / GLYCOLAX   Shingrix injection Generic drug: Zoster Vaccine Adjuvanted   triamcinolone cream 0.1 % Commonly known as: KENALOG       TAKE these medications    ALPRAZolam 0.5 MG tablet Commonly known as: XANAX Take 1 tablet (0.5 mg total) by mouth 3 (three) times daily as needed for anxiety.   Calcium-Vitamin D 600-200 MG-UNIT tablet Take by mouth.   dicyclomine 10 MG capsule Commonly known as: BENTYL Take 1 capsule (10 mg total) by mouth every 6 (six) hours as needed (for abdominal cramping/ discomfort).   FLUoxetine 20 MG tablet Commonly known as: PROZAC Take 1 tablet (20 mg total) by mouth daily.   hydrOXYzine 25 MG capsule Commonly known as: VISTARIL TAKE ONE CAPSULE BY MOUTH THREE TIMES A DAY AS NEEDED FOR ANXIETY   lovastatin 10 MG tablet Commonly known as:  MEVACOR TAKE 1 TABLET BY MOUTH AT BEDTIME FOR CHOLESTEROL   metoprolol tartrate 25 MG tablet Commonly known as: LOPRESSOR TAKE 1/2 TABLET BY MOUTH TWICE DAILY   nitroGLYCERIN 0.4 MG SL tablet Commonly known as: NITROSTAT Place 1 tablet (0.4 mg total) under the tongue every 5 (five) minutes as needed for chest pain.   pantoprazole 40 MG tablet Commonly known as: PROTONIX Take 1 tablet (40 mg total) by mouth 2 (two) times daily.   prochlorperazine 5 MG tablet Commonly known as: COMPAZINE Take 1 tablet (5 mg total) by mouth every 6 (six) hours as needed for nausea or vomiting.   rizatriptan 10 MG tablet Commonly known as: Maxalt Take 1 tablet  (10 mg total) by mouth as needed for migraine. May repeat in 2 hours if needed        Exam BP 120/69 (BP Location: Left Arm, Patient Position: Sitting, Cuff Size: Normal)   Pulse 67   Temp 97.9 F (36.6 C) (Oral)   Ht 6' (1.829 m)   Wt 223 lb 8 oz (101.4 kg)   SpO2 98%   BMI 30.31 kg/m  General:  well developed, well nourished, in no apparent distress Heart: RRR, 1+ pitting LE edema Lungs:  CTAB. No respiratory distress Psych: well oriented with normal range of affect and age-appropriate judgement/insight, alert and oriented x4.  Assessment and Plan  PTSD (post-traumatic stress disorder)  Hyperlipidemia, unspecified hyperlipidemia type  Chronic, stable. Cont Prozac 20 mg/d, Xanax 0.5 mg TID prn, hydroxyzine 25 mg prn. UDS and CSC updated today. Cont w therapist.  Chronic, stable. Cont lovastatin 10 mg/d. Counseled on diet/exercise.  F/u in 6 mo for med ck and AWV. The patient voiced understanding and agreement to the plan.  [place], DO 10/14/22 2:28 PM

## 2022-10-15 ENCOUNTER — Ambulatory Visit (INDEPENDENT_AMBULATORY_CARE_PROVIDER_SITE_OTHER): Payer: Medicare Other | Admitting: *Deleted

## 2022-10-15 VITALS — BP 122/75

## 2022-10-15 DIAGNOSIS — Z0000 Encounter for general adult medical examination without abnormal findings: Secondary | ICD-10-CM

## 2022-10-15 NOTE — Progress Notes (Signed)
Subjective:  Fall and depression screens completed in office with provider on 10/14/22.  Answers verified with pt.     Cesar [name] is a 77 y.o. male who presents for Medicare Annual/Subsequent preventive examination.  I connected with  Cesar [name] on 10/15/22 by a audio enabled telemedicine application and verified that I am speaking with the correct person using two identifiers.  Patient Location: Home  Provider Location: Office/Clinic  I discussed the limitations of evaluation and management by telemedicine. The patient expressed understanding and agreed to proceed.   Review of Systems    Defer to PCP Cardiac Risk Factors include: advanced age (>40men, >78 women);male gender;dyslipidemia     Objective:    Today's Vitals   10/15/22 1522  BP: 122/75   There is no height or weight on file to calculate BMI.     10/15/2022    3:01 PM 10/14/2021    1:49 PM  Advanced Directives  Does Patient Have a Medical Advance Directive? Yes Yes  Type of Paramedic of [place];Living will [place];Living will  Does patient want to make changes to medical advance directive? No - Patient declined   Copy of [place] in Chart? No - copy requested No - copy requested    Current Medications (verified) Outpatient Encounter Medications as of 10/15/2022  Medication Sig   ALPRAZolam (XANAX) 0.5 MG tablet Take 1 tablet (0.5 mg total) by mouth 3 (three) times daily as needed for anxiety.   Calcium-Vitamin D 600-200 MG-UNIT tablet Take by mouth.   dicyclomine (BENTYL) 10 MG capsule Take 1 capsule (10 mg total) by mouth every 6 (six) hours as needed (for abdominal cramping/ discomfort).   FLUoxetine (PROZAC) 20 MG tablet Take 1 tablet (20 mg total) by mouth daily.   hydrOXYzine (VISTARIL) 25 MG capsule TAKE ONE CAPSULE BY MOUTH THREE TIMES A DAY AS NEEDED FOR ANXIETY   lovastatin (MEVACOR) 10 MG tablet TAKE 1 TABLET BY MOUTH AT BEDTIME  FOR CHOLESTEROL   metoprolol tartrate (LOPRESSOR) 25 MG tablet TAKE 1/2 TABLET BY MOUTH TWICE DAILY   nitroGLYCERIN (NITROSTAT) 0.4 MG SL tablet Place 1 tablet (0.4 mg total) under the tongue every 5 (five) minutes as needed for chest pain.   pantoprazole (PROTONIX) 40 MG tablet Take 1 tablet (40 mg total) by mouth 2 (two) times daily.   prochlorperazine (COMPAZINE) 5 MG tablet Take 1 tablet (5 mg total) by mouth every 6 (six) hours as needed for nausea or vomiting.   rizatriptan (MAXALT) 10 MG tablet Take 1 tablet (10 mg total) by mouth as needed for migraine. May repeat in 2 hours if needed   No facility-administered encounter medications on file as of 10/15/2022.    Allergies (verified) Formaldehyde, Latex, Sulfa antibiotics, Butorphanol, Celecoxib, Hydrocodone-acetaminophen, Metformin, Oxycodone-acetaminophen, Povidone iodine, Metformin hcl, Simvastatin, Tamsulosin, Nifedipine, Nitroglycerin, Povidone-iodine, Quaternium-15, Rofecoxib, and Tape   History: Past Medical History:  Diagnosis Date   Allergy    Angina at rest    Arthritis    Back pain, lumbosacral    Back pain, thoracic    Depression    Eczema    GERD (gastroesophageal reflux disease)    Prostatitis    RSD (reflex sympathetic dystrophy)    on the right side   Vertigo    Past Surgical History:  Procedure Laterality Date   BACK SURGERY  march 1998 and december 1998   carpel tunnel Left    cholecystectectomy  ESOPHAGEAL DILATION     stretching of the esophagus   ESOPHAGEAL MANOMETRY N/A 07/01/2022   Procedure: ESOPHAGEAL MANOMETRY (EM);  Surgeon: Shellia Cleverly, DO;  Location: [facility];  Service: Gastroenterology;  Laterality: N/A;   NASAL SINUS SURGERY     NASAL SINUS SURGERY     thumb joint replacement Left 2003   TONSILLECTOMY     Family History  Problem Relation Age of Onset   Other Mother        had a hole in her heart   Pneumonia Father        died with pneumonia   Allergic rhinitis Neg Hx     Angioedema Neg Hx    Asthma Neg Hx    Eczema Neg Hx    Immunodeficiency Neg Hx    Urticaria Neg Hx    Colon cancer Neg Hx    Colon polyps Neg Hx    Stomach cancer Neg Hx    Pancreatic cancer Neg Hx    Rectal cancer Neg Hx    Esophageal cancer Neg Hx    Social History   Socioeconomic History   Marital status: Widowed    Spouse name: Not on file   Number of children: 0   Years of education: Not on file   Highest education level: Not on file  Occupational History   Not on file  Tobacco Use   Smoking status: Never   Smokeless tobacco: Never  Vaping Use   Vaping Use: Never used  Substance and Sexual Activity   Alcohol use: No   Drug use: No   Sexual activity: Not on file  Other Topics Concern   Not on file  Social History Narrative   Not on file   Social Determinants of Health   Financial Resource Strain: Low Risk  (10/14/2021)   Overall Financial Resource Strain (CARDIA)    Difficulty of Paying Living Expenses: Not hard at all  Food Insecurity: No Food Insecurity (10/15/2022)   Hunger Vital Sign    Worried About Running Out of Food in the Last Year: Never true    Ran Out of Food in the Last Year: Never true  Transportation Needs: No Transportation Needs (10/15/2022)   PRAPARE - Administrator, Civil Service (Medical): No    Lack of Transportation (Non-Medical): No  Physical Activity: Sufficiently Active (10/14/2021)   Exercise Vital Sign    Days of Exercise per Week: 5 days    Minutes of Exercise per Session: 30 min  Stress: No Stress Concern Present (10/14/2021)   [employer] of Occupational Health - Occupational Stress Questionnaire    Feeling of Stress : Not at all  Social Connections: Moderately Isolated (10/14/2021)   Social Connection and Isolation Panel [NHANES]    Frequency of Communication with Friends and Family: Three times a week    Frequency of Social Gatherings with Friends and Family: Twice a week    Attends Religious Services: Never     Database administrator or Organizations: Yes    Attends Engineer, structural: More than 4 times per year    Marital Status: Widowed    Tobacco Counseling Counseling given: Not Answered   Clinical Intake:  Pre-visit preparation completed: Yes  Pain : No/denies pain  Diabetes: No  How often do you need to have someone help you when you read instructions, pamphlets, or other written materials from your doctor or pharmacy?: 1 - Never  Activities of Daily Living  10/15/2022    3:05 PM  In your present state of health, do you have any difficulty performing the following activities:  Hearing? 0  Vision? 0  Difficulty concentrating or making decisions? 0  Walking or climbing stairs? 0  Dressing or bathing? 0  Doing errands, shopping? 0  Preparing Food and eating ? N  Using the Toilet? N  In the past six months, have you accidently leaked urine? N  Do you have problems with loss of bowel control? N  Managing your Medications? N  Managing your Finances? N  Housekeeping or managing your Housekeeping? N    Patient Care Team: Shelda Pal, DO as PCP - General (Family Medicine) Allyne Gee, MD as Referring Physician (Psychiatry)  Indicate any recent Medical Services you may have received from other than [facility] providers in the past year (date may be approximate).     Assessment:   This is a routine wellness examination for [place].  Hearing/Vision screen No results found.  Dietary issues and exercise activities discussed: Current Exercise Habits: Home exercise routine, Type of exercise: Other - see comments;strength training/weights (stationary bike), Time (Minutes): 30, Frequency (Times/Week): 4, Weekly Exercise (Minutes/Week): 120, Intensity: Mild, Exercise limited by: None identified   Goals Addressed   None    Depression Screen    10/14/2022    2:21 PM 08/07/2022   12:02 PM 10/14/2021    1:56 PM 11/20/2020    2:05 PM 11/20/2020    1:53 PM   PHQ 2/9 Scores  PHQ - 2 Score 0 0 1 6 1   PHQ- 9 Score 0   16     Fall Risk    10/14/2022    2:21 PM 08/07/2022   12:01 PM 10/14/2021    1:54 PM 02/05/2021    1:49 PM  Fall Risk   Falls in the past year? 0 0 0 0  Number falls in past yr: 0 0 0 0  Injury with Fall? 0 0 0 0  Risk for fall due to : No Fall Risks   No Fall Risks  Follow up Falls evaluation completed Falls evaluation completed Falls prevention discussed Falls evaluation completed    [place]:  Any stairs in or around the home? No  Home free of loose throw rugs in walkways, pet beds, electrical cords, etc? Yes  Adequate lighting in your home to reduce risk of falls? Yes   ASSISTIVE DEVICES UTILIZED TO PREVENT FALLS:  Life alert? Yes  Use of a cane, walker or w/c? Yes  Grab bars in the bathroom? No  Shower chair or bench in shower? No  Elevated toilet seat or a handicapped toilet?  Comfort height  TIMED UP AND GO:  Was the test performed?  No, audio visit .    Cognitive Function:        10/15/2022    3:12 PM  6CIT Screen  What Year? 0 points  What month? 0 points  What time? 0 points  Count back from 20 0 points  Months in reverse 0 points  Repeat phrase 2 points  Total Score 2 points    Immunizations Immunization History  Administered Date(s) Administered   COVID-19, mRNA, vaccine(Comirnaty)12 years and older 07/08/2022   Fluad Quad(high Dose 65+) 06/11/2021, 06/09/2022   Influenza, High Dose Seasonal PF 06/21/2020   PFIZER Comirnaty(Gray Top)Covid-19 Tri-Sucrose Vaccine 04/07/2021   PFIZER(Purple Top)SARS-COV-2 Vaccination 01/25/2020, 02/15/2020, 08/19/2020, 04/07/2021   Pfizer Covid-19 Vaccine Bivalent Booster 33yrs &  up 06/11/2021   Pneumococcal Conjugate-13 07/04/2015   Pneumococcal Polysaccharide-23 06/28/2014   Td 08/07/2022   Tdap 06/06/2013   Zoster Recombinat (Shingrix) 11/11/2021, 04/13/2022   Zoster, Live 04/20/2008    TDAP status: Up to  date  Flu Vaccine status: Up to date  Pneumococcal vaccine status: Up to date  Covid-19 vaccine status: Declined, Education has been provided regarding the importance of this vaccine but patient still declined. Advised may receive this vaccine at local pharmacy or Health Dept.or vaccine clinic. Aware to provide a copy of the vaccination record if obtained from local pharmacy or Health Dept. Verbalized acceptance and understanding.  Qualifies for Shingles Vaccine? Yes   Zostavax completed Yes   Shingrix Completed?: Yes  Screening Tests Health Maintenance  Topic Date Due   COVID-19 Vaccine (7 - 2023-24 season) 06/14/2023 (Originally 09/02/2022)   Medicare Annual Wellness ([place])  10/16/2023   COLONOSCOPY (Pts 45-76yrs Insurance coverage will need to be confirmed)  11/05/2025   DTaP/Tdap/Td (3 - Td or Tdap) 08/07/2032   Pneumonia Vaccine 24+ Years old  Completed   INFLUENZA VACCINE  Completed   Hepatitis C Screening  Completed   Zoster Vaccines- Shingrix  Completed   HPV VACCINES  Aged Out    Health Maintenance  There are no preventive care reminders to display for this patient.   Colorectal cancer screening: Type of screening: Colonoscopy. Completed 11/06/15. Repeat every 10 years  Lung Cancer Screening: (Low Dose CT Chest recommended if Age 61-80 years, 30 pack-year currently smoking OR have quit w/in 15years.) does not qualify.   Additional Screening:  Hepatitis C Screening: does qualify; Completed 11/12/06  Vision Screening: Recommended annual ophthalmology exams for early detection of glaucoma and other disorders of the eye. Is the patient up to date with their annual eye exam?  Yes  Who is the provider or what is the name of the office in which the patient attends annual eye exams? Dr. Yehuda Budd (spelling) If pt is not established with a provider, would they like to be referred to a provider to establish care? No .   Dental Screening: Recommended annual dental exams for proper  oral hygiene  Community Resource Referral / Chronic Care Management: CRR required this visit?  No   CCM required this visit?  No      Plan:     I have personally reviewed and noted the following in the patient's chart:   Medical and social history Use of alcohol, tobacco or illicit drugs  Current medications and supplements including opioid prescriptions. Patient is not currently taking opioid prescriptions. Functional ability and status Nutritional status Physical activity Advanced directives List of other physicians Hospitalizations, surgeries, and ER visits in previous 12 months Vitals Screenings to include cognitive, depression, and falls Referrals and appointments  In addition, I have reviewed and discussed with patient certain preventive protocols, quality metrics, and best practice recommendations. A written personalized care plan for preventive services as well as general preventive health recommendations were provided to patient.   Due to this being a telephonic visit, the after visit summary with patients personalized plan was offered to patient via mail or my-chart. Patient would like to access on my-chart.  Beatris Ship, [place]   10/15/2022   Nurse Notes: None

## 2022-10-15 NOTE — Patient Instructions (Signed)
[name] , Thank you for taking time to come for your Medicare Wellness Visit. I appreciate your ongoing commitment to your health goals. Please review the following plan we discussed and let me know if I can assist you in the future.   These are the goals we discussed:  Goals      Patient Stated     Continue to eat healthy & walk        This is a list of the screening recommended for you and due dates:  Health Maintenance  Topic Date Due   COVID-19 Vaccine (7 - 2023-24 season) 06/14/2023*   Medicare Annual Wellness Visit  10/16/2023   Colon Cancer Screening  11/05/2025   DTaP/Tdap/Td vaccine (3 - Td or Tdap) 08/07/2032   Pneumonia Vaccine  Completed   Flu Shot  Completed   Hepatitis C Screening: USPSTF Recommendation to screen - Ages 18-79 yo.  Completed   Zoster (Shingles) Vaccine  Completed   HPV Vaccine  Aged Out  *Topic was postponed. The date shown is not the original due date.     Next appointment: Follow up in one year for your annual wellness visit.   Preventive Care 8 Years and Older, Male Preventive care refers to lifestyle choices and visits with your health care provider that can promote health and wellness. What does preventive care include? A yearly physical exam. This is also called an annual well check. Dental exams once or twice a year. Routine eye exams. Ask your health care provider how often you should have your eyes checked. Personal lifestyle choices, including: Daily care of your teeth and gums. Regular physical activity. Eating a healthy diet. Avoiding tobacco and drug use. Limiting alcohol use. Practicing safe sex. Taking low doses of aspirin every day. Taking vitamin and mineral supplements as recommended by your health care provider. What happens during an annual well check? The services and screenings done by your health care provider during your annual well check will depend on your age, overall health, lifestyle risk factors, and family  history of disease. Counseling  Your health care provider may ask you questions about your: Alcohol use. Tobacco use. Drug use. Emotional well-being. Home and relationship well-being. Sexual activity. Eating habits. History of falls. Memory and ability to understand (cognition). Work and work Statistician. Screening  You may have the following tests or measurements: Height, weight, and BMI. Blood pressure. Lipid and cholesterol levels. These may be checked every 5 years, or more frequently if you are over 22 years old. Skin check. Lung cancer screening. You may have this screening every year starting at age 13 if you have a 30-pack-year history of smoking and currently smoke or have quit within the past 15 years. Fecal occult blood test (FOBT) of the stool. You may have this test every year starting at age 66. Flexible sigmoidoscopy or colonoscopy. You may have a sigmoidoscopy every 5 years or a colonoscopy every 10 years starting at age 13. Prostate cancer screening. Recommendations will vary depending on your family history and other risks. Hepatitis C blood test. Hepatitis B blood test. Sexually transmitted disease (STD) testing. Diabetes screening. This is done by checking your blood sugar (glucose) after you have not eaten for a while (fasting). You may have this done every 1-3 years. Abdominal aortic aneurysm (AAA) screening. You may need this if you are a current or former smoker. Osteoporosis. You may be screened starting at age 71 if you are at high risk. Talk with your health  care provider about your test results, treatment options, and if necessary, the need for more tests. Vaccines  Your health care provider may recommend certain vaccines, such as: Influenza vaccine. This is recommended every year. Tetanus, diphtheria, and acellular pertussis (Tdap, Td) vaccine. You may need a Td booster every 10 years. Zoster vaccine. You may need this after age 41. Pneumococcal  13-valent conjugate (PCV13) vaccine. One dose is recommended after age 57. Pneumococcal polysaccharide (PPSV23) vaccine. One dose is recommended after age 39. Talk to your health care provider about which screenings and vaccines you need and how often you need them. This information is not intended to replace advice given to you by your health care provider. Make sure you discuss any questions you have with your health care provider. Document Released: 09/20/2015 Document Revised: 05/13/2016 Document Reviewed: 06/25/2015 Elsevier Interactive Patient Education  2017 [employer] Prevention in the Home Falls can cause injuries. They can happen to people of all ages. There are many things you can do to make your home safe and to help prevent falls. What can I do on the outside of my home? Regularly fix the edges of walkways and driveways and fix any cracks. Remove anything that might make you trip as you walk through a door, such as a raised step or threshold. Trim any bushes or trees on the path to your home. Use bright outdoor lighting. Clear any walking paths of anything that might make someone trip, such as rocks or tools. Regularly check to see if handrails are loose or broken. Make sure that both sides of any steps have handrails. Any raised decks and porches should have guardrails on the edges. Have any leaves, snow, or ice cleared regularly. Use sand or salt on walking paths during winter. Clean up any spills in your garage right away. This includes oil or grease spills. What can I do in the bathroom? Use night lights. Install grab bars by the toilet and in the tub and shower. Do not use towel bars as grab bars. Use non-skid mats or decals in the tub or shower. If you need to sit down in the shower, use a plastic, non-slip stool. Keep the floor dry. Clean up any water that spills on the floor as soon as it happens. Remove soap buildup in the tub or shower regularly. Attach  bath mats securely with double-sided non-slip rug tape. Do not have throw rugs and other things on the floor that can make you trip. What can I do in the bedroom? Use night lights. Make sure that you have a light by your bed that is easy to reach. Do not use any sheets or blankets that are too big for your bed. They should not hang down onto the floor. Have a firm chair that has side arms. You can use this for support while you get dressed. Do not have throw rugs and other things on the floor that can make you trip. What can I do in the kitchen? Clean up any spills right away. Avoid walking on wet floors. Keep items that you use a lot in easy-to-reach places. If you need to reach something above you, use a strong step stool that has a grab bar. Keep electrical cords out of the way. Do not use floor polish or wax that makes floors slippery. If you must use wax, use non-skid floor wax. Do not have throw rugs and other things on the floor that can make you trip. What can  I do with my stairs? Do not leave any items on the stairs. Make sure that there are handrails on both sides of the stairs and use them. Fix handrails that are broken or loose. Make sure that handrails are as long as the stairways. Check any carpeting to make sure that it is firmly attached to the stairs. Fix any carpet that is loose or worn. Avoid having throw rugs at the top or bottom of the stairs. If you do have throw rugs, attach them to the floor with carpet tape. Make sure that you have a light switch at the top of the stairs and the bottom of the stairs. If you do not have them, ask someone to add them for you. What else can I do to help prevent falls? Wear shoes that: Do not have high heels. Have rubber bottoms. Are comfortable and fit you well. Are closed at the toe. Do not wear sandals. If you use a stepladder: Make sure that it is fully opened. Do not climb a closed stepladder. Make sure that both sides of the  stepladder are locked into place. Ask someone to hold it for you, if possible. Clearly mark and make sure that you can see: Any grab bars or handrails. First and last steps. Where the edge of each step is. Use tools that help you move around (mobility aids) if they are needed. These include: Canes. Walkers. Scooters. Crutches. Turn on the lights when you go into a dark area. Replace any light bulbs as soon as they burn out. Set up your furniture so you have a clear path. Avoid moving your furniture around. If any of your floors are uneven, fix them. If there are any pets around you, be aware of where they are. Review your medicines with your doctor. Some medicines can make you feel dizzy. This can increase your chance of falling. Ask your doctor what other things that you can do to help prevent falls. This information is not intended to replace advice given to you by your health care provider. Make sure you discuss any questions you have with your health care provider. Document Released: 06/20/2009 Document Revised: 01/30/2016 Document Reviewed: 09/28/2014 Elsevier Interactive Patient Education  2017 [employer].

## 2022-10-16 LAB — DRUG MONITORING PANEL 376104, URINE

## 2022-10-16 LAB — DM TEMPLATE

## 2022-12-08 ENCOUNTER — Telehealth: Payer: Self-pay | Admitting: Family Medicine

## 2022-12-08 NOTE — Telephone Encounter (Signed)
Patient is scheduled for appointment tomorrow 12/09/2022 Medication prescribed by PCP is working well for him and may need something in addition to take with it.  Needs to discuss seeing a Psy[facility] Outside of the [place] . Will discuss with PCP at his OV.

## 2022-12-09 ENCOUNTER — Encounter: Payer: Self-pay | Admitting: Family Medicine

## 2022-12-09 ENCOUNTER — Ambulatory Visit (INDEPENDENT_AMBULATORY_CARE_PROVIDER_SITE_OTHER): Payer: Medicare Other | Admitting: Family Medicine

## 2022-12-09 VITALS — BP 138/70 | HR 76 | Temp 98.0°F | Resp 16 | Ht 72.0 in | Wt 223.4 lb

## 2022-12-09 DIAGNOSIS — F411 Generalized anxiety disorder: Secondary | ICD-10-CM

## 2022-12-09 DIAGNOSIS — F4310 Post-traumatic stress disorder, unspecified: Secondary | ICD-10-CM

## 2022-12-09 MED ORDER — FLUOXETINE HCL 40 MG PO CAPS: 40.0000 mg | ORAL_CAPSULE | Freq: Every day | ORAL | 3 refills | Status: DC

## 2022-12-09 NOTE — Patient Instructions (Signed)
If you do not hear anything about your referral in the next 1-2 weeks, call our office and ask for an update.  Continue your other meds.   Please consider counseling. Contact [phone] to schedule an appointment or inquire about cost/insurance coverage.  [facility] located at [place], [place], [place].  Phone number = [phone].  Dr. Lennice Sites - Adult Psychiatry.    [facility] located at [place], [place], [place]. Phone number = [phone].   [facility] located at [address] [place], [place].  Phone number = [phone].   [facility] located at [place], [place], [place].  Phone number = [phone].  Let us know if you need anything.
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
## 2022-12-24 ENCOUNTER — Telehealth: Payer: Self-pay | Admitting: Family Medicine

## 2022-12-24 NOTE — Telephone Encounter (Signed)
Pt called asking for Cesar [name] to give him a call to talk about a few things regarding numbness. Pt stated it was a little personal and didn't want to go into too much detail.

## 2022-12-25 ENCOUNTER — Encounter: Payer: Self-pay | Admitting: Family Medicine

## 2022-12-25 ENCOUNTER — Ambulatory Visit (INDEPENDENT_AMBULATORY_CARE_PROVIDER_SITE_OTHER): Payer: Medicare Other | Admitting: Family Medicine

## 2022-12-25 VITALS — BP 131/85 | HR 75 | Temp 98.0°F | Ht 72.0 in | Wt 226.1 lb

## 2022-12-25 DIAGNOSIS — M4802 Spinal stenosis, cervical region: Secondary | ICD-10-CM

## 2022-12-25 DIAGNOSIS — R29898 Other symptoms and signs involving the musculoskeletal system: Secondary | ICD-10-CM

## 2022-12-25 MED ORDER — METHYLPREDNISOLONE 4 MG PO TBPK: ORAL_TABLET | ORAL | 0 refills | Status: DC

## 2022-12-25 NOTE — Progress Notes (Signed)
Chief Complaint  Patient presents with   Numbness    Difficulty walking    Subjective: Patient is a 77 y.o. male here for difficulty walking.  2 d ago, was eating dinner and got up and felt weakness in his legs. He did not get up on Thursday and made his way here today. Slightly better but still having quite a bit of weakness in his LE's.  He reports having imaging done in 2019 at [facility] and was told he needed surgery.  He was also told any trauma could potentially paralyze him due to narrowing in his spine.  He reports his surgeon moved away and he never had the procedure.  He has been doing fine until 2 days ago.  No recent trauma or pain.  No paresthesias.  He has not taken anything at home so far.  He has seen Dr. Danielle Dess of the [facility] surgery team in the past for a lumbar issue.  He is not having any low back pain.  Past Medical History:  Diagnosis Date   Allergy    Angina at rest    Arthritis    Back pain, lumbosacral    Back pain, thoracic    Depression    Eczema    GERD (gastroesophageal reflux disease)    Prostatitis    RSD (reflex sympathetic dystrophy)    on the right side   Vertigo     Objective: BP 131/85 (BP Location: Right Arm, Patient Position: Sitting, Cuff Size: Normal)   Pulse 75   Temp 98 F (36.7 C) (Oral)   Ht 6' (1.829 m)   Wt 226 lb 2 oz (102.6 kg)   SpO2 97%   BMI 30.67 kg/m  General: Awake, appears stated age MSK: No TTP over the cervical paraspinal musculature.  He also does not have any pain in his lumbar spine region. Neuro: 3/4 patellar reflexes bilaterally which is brisk compared to his regular exam, 2/4 biceps reflex bilaterally, there is no clonus, 4/5 strength throughout the lower extremities.  Gait is very cautious and unsteady, uses a cane to help with balance. 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  We reached out to the neurosurgery team. Spoke w Dr. Dutch Quint who stated they will see him Monday with Dr. Danielle Dess who has operated on his lumbar spine. Recommended Medrol Dosepak which was sent to his pharmacy who deliver to his abode. Told him to take it easy for the weekend and call 911 if things worsen.  The patient voiced understanding and agreement to the plan.  I spent 45 min with the pt discussing the above plan in addition to reviewing his chart and coordinating care on the same day of the visit.   [name] [place], DO 12/25/22  4:53 PM

## 2022-12-25 NOTE — Patient Instructions (Addendum)
If things get worse, call 911.  Monday morning, please call Dr. Verlee Rossetti office at [phone]. They should know about you.   Take it easy over the weekend.  Let us know if you need anything.

## 2022-12-25 NOTE — Telephone Encounter (Signed)
Since Wednesday night having difficulty walking due to numbness Scheduled appt with PCP today at 4:15.

## 2022-12-29 ENCOUNTER — Ambulatory Visit: Payer: Medicare Other | Admitting: Behavioral Health

## 2023-01-08 ENCOUNTER — Telehealth: Payer: Self-pay | Admitting: Family Medicine

## 2023-01-08 NOTE — Telephone Encounter (Signed)
Pt called stating that he is having a few issues with his back and is having trouble walking. Pt would like to have a call back when possible to go over some questions.

## 2023-01-11 ENCOUNTER — Telehealth: Payer: Self-pay | Admitting: *Deleted

## 2023-01-11 NOTE — Telephone Encounter (Signed)
He called back to say he found someone to take him tomorrow to his imaging appointment

## 2023-01-11 NOTE — Telephone Encounter (Signed)
Per conversation with Zella Ball, appt is not needed at this time. Pt will be going to get an MRI done on 5.7.24 to look into difficulties with getting around.

## 2023-01-11 NOTE — Telephone Encounter (Signed)
Called the patient and message correct. He did call EMS Saturday to help get off the floor. Right leg is so weak he cannot put weight on it. Saw some blood in his stool this morning. He is unable to come in due to weakness/unable to walk well and afraid he will not be able to make it on his own. He does have an MRI tomorrow, unsure how he will get there. He is going to find someone to help him get to MRI done.

## 2023-01-11 NOTE — Telephone Encounter (Signed)
Do not see where pt went to ED.  Need to call and check status.

## 2023-01-11 NOTE — Telephone Encounter (Signed)
Please schedule an appt

## 2023-01-11 NOTE — Telephone Encounter (Signed)
Relationship To Patient Self Return Phone Number [phone] (Primary) Chief Complaint Walking difficulty Reason for Call Symptomatic / Request for Health Information Initial Comment Caller states he has a back problem that he saw a Neurologist about last week. They want him to have an MRI done this Tuesday and the doctor said he can see its his lower back and spine by the way he walks. He fell the other day when walking. Its effecting his right leg also and getting to the point where he cant walk at all. He doesnt know what to do or if he should drive to his MRI on Tuesday. He is looking for advice. GOTO Facility Not Listed [facility] Translation No Nurse Assessment Nurse: Chilton Si, RN, [place] Date/Time (Eastern Time): 01/09/2023 11:09:54 AM Confirm and document reason for call. If symptomatic, describe symptoms. ---Caller states he has a back problem that he saw a Neurologist about last week. They want him to have an MRI done this Tuesday and the doctor said he can see its his lower back and spine by the way he walks. He fell thursday night when walking (skinned arm). Its effecting his right leg its buckling on him, also and getting to the point where he cant walk at all. He doesnt know what to do or if he should drive to his MRI on Tuesday. He is looking for advice. also has numbness in hands thats has gotten worse has severe back pain.  Does the patient have any new or worsening symptoms? ---Yes Will a triage be completed? ---Yes Related visit to physician within the last 2 weeks? ---Yes Does the PT have any chronic conditions? (i.e. diabetes, asthma, this includes High risk factors for pregnancy, etc.) ---Yes List chronic conditions. ---chronic back pain, vertigo, Is this a behavioral health or substance abuse call? ---No  Final Disposition 01/09/2023 11:29:01 AM Go to ED Now (or PCP triage) Yes Chilton Si, RN, Leonie Man Disagree/Comply Comply Caller  Understands Yes PreDisposition Did not know what to do Care Advice Given Per Guideline GO TO ED NOW (OR PCP TRIAGE): CARE ADVICE given per Back Pain (Adult) guideline. Comments User: Starr Sinclair, RN Date/Time Lamount Cohen Time): 01/09/2023 11:28:47 AM caller states that he is afraid to stand due to he may fall and doesnt have transportation to ED, he is going to call 911 to get transportation to ED. Referrals GO TO FACILITY OTHER - SPECIFY

## 2023-01-18 ENCOUNTER — Ambulatory Visit: Payer: Medicare Other | Admitting: Family Medicine

## 2023-01-19 ENCOUNTER — Ambulatory Visit: Payer: Medicare Other | Admitting: Behavioral Health

## 2023-01-22 ENCOUNTER — Other Ambulatory Visit: Payer: Self-pay | Admitting: Family Medicine

## 2023-01-22 DIAGNOSIS — F411 Generalized anxiety disorder: Secondary | ICD-10-CM

## 2023-01-22 DIAGNOSIS — F4310 Post-traumatic stress disorder, unspecified: Secondary | ICD-10-CM

## 2023-01-22 NOTE — Telephone Encounter (Signed)
Last OV---12/25/2022 Last RF---07/08/2022--#[id number] with 1 refill

## 2023-01-25 ENCOUNTER — Other Ambulatory Visit: Payer: Self-pay | Admitting: Family Medicine

## 2023-02-09 ENCOUNTER — Telehealth: Payer: Self-pay | Admitting: Family Medicine

## 2023-02-09 NOTE — Telephone Encounter (Signed)
The patient is having swelling in his legs and scheduled appt with PCP tomorrow 02/10/23 at 4:15 .

## 2023-02-09 NOTE — Telephone Encounter (Signed)
Patient called and would like provider to call him regarding the swelling of the legs. Please call [phone]

## 2023-02-10 ENCOUNTER — Ambulatory Visit (INDEPENDENT_AMBULATORY_CARE_PROVIDER_SITE_OTHER): Payer: Medicare Other | Admitting: Family Medicine

## 2023-02-10 ENCOUNTER — Encounter: Payer: Self-pay | Admitting: Family Medicine

## 2023-02-10 VITALS — BP 120/80 | HR 78 | Temp 98.2°F | Ht 72.0 in | Wt 216.1 lb

## 2023-02-10 DIAGNOSIS — R634 Abnormal weight loss: Secondary | ICD-10-CM | POA: Diagnosis not present

## 2023-02-10 DIAGNOSIS — M7989 Other specified soft tissue disorders: Secondary | ICD-10-CM | POA: Diagnosis not present

## 2023-02-10 NOTE — Patient Instructions (Addendum)
For the swelling in your lower extremities, be sure to elevate your legs when able, mind the salt intake, stay physically active and consider wearing compression stockings.  Give [place] 2-3 business days to get the results of your labs back.   Nothing sinister appears to be causing this issue.   Let us know if you need anything.

## 2023-02-10 NOTE — Progress Notes (Signed)
Chief Complaint  Patient presents with   Leg Swelling    Cesar [name] here for bilateral leg swelling.  Duration: 7 weeks Hx of prolonged bedrest, recent surgery, travel or injury? No Pain the calf? No SOB? No Personal or family history of clot or bleeding disorder? No Hx of heart failure, renal failure, hepatic failure? No  Past Medical History:  Diagnosis Date   Allergy    Angina at rest    Arthritis    Back pain, lumbosacral    Back pain, thoracic    Depression    Eczema    GERD (gastroesophageal reflux disease)    Prostatitis    RSD (reflex sympathetic dystrophy)    on the right side   Vertigo    Family History  Problem Relation Age of Onset   Other Mother        had a hole in her heart   Pneumonia Father        died with pneumonia   Allergic rhinitis Neg Hx    Angioedema Neg Hx    Asthma Neg Hx    Eczema Neg Hx    Immunodeficiency Neg Hx    Urticaria Neg Hx    Colon cancer Neg Hx    Colon polyps Neg Hx    Stomach cancer Neg Hx    Pancreatic cancer Neg Hx    Rectal cancer Neg Hx    Esophageal cancer Neg Hx    Past Surgical History:  Procedure Laterality Date   BACK SURGERY  march 1998 and december 1998   carpel tunnel Left    cholecystectectomy     ESOPHAGEAL DILATION     stretching of the esophagus   ESOPHAGEAL MANOMETRY N/A 07/01/2022   Procedure: ESOPHAGEAL MANOMETRY (EM);  Surgeon: Shellia Cleverly, DO;  Location: [facility];  Service: Gastroenterology;  Laterality: N/A;   NASAL SINUS SURGERY     NASAL SINUS SURGERY     thumb joint replacement Left 2003   TONSILLECTOMY      Current Outpatient Medications:    ALPRAZolam (XANAX) 0.5 MG tablet, TAKE 1 TABLET BY MOUTH 3 TIMES A DAY AS NEEDED FOR ANXIETY, Disp: 270 tablet, Rfl: 1   Calcium-Vitamin D 600-200 MG-UNIT tablet, Take by mouth., Disp: , Rfl:    dicyclomine (BENTYL) 10 MG capsule, Take 1 capsule (10 mg total) by mouth every 6 (six) hours as needed (for abdominal cramping/  discomfort)., Disp: 120 capsule, Rfl: 0   FLUoxetine (PROZAC) 40 MG capsule, Take 1 capsule (40 mg total) by mouth daily., Disp: 90 capsule, Rfl: 3   hydrOXYzine (VISTARIL) 25 MG capsule, TAKE ONE CAPSULE BY MOUTH THREE TIMES A DAY AS NEEDED FOR ANXIETY, Disp: , Rfl:    lovastatin (MEVACOR) 10 MG tablet, TAKE 1 TABLET BY MOUTH AT BEDTIME FOR CHOLESTEROL, Disp: 30 tablet, Rfl: 2   methylPREDNISolone (MEDROL DOSEPAK) 4 MG TBPK tablet, Follow instructions on package., Disp: 21 tablet, Rfl: 0   metoprolol tartrate (LOPRESSOR) 25 MG tablet, TAKE 1/2 TABLET BY MOUTH TWICE DAILY, Disp: 90 tablet, Rfl: 0   nitroGLYCERIN (NITROSTAT) 0.4 MG SL tablet, Place 1 tablet (0.4 mg total) under the tongue every 5 (five) minutes as needed for chest pain., Disp: 30 tablet, Rfl: 2   pantoprazole (PROTONIX) 40 MG tablet, Take 1 tablet (40 mg total) by mouth 2 (two) times daily., Disp: 90 tablet, Rfl: 3   prochlorperazine (COMPAZINE) 5 MG tablet, Take 1 tablet (5 mg total) by mouth every 6 (six) hours  as needed for nausea or vomiting., Disp: 30 tablet, Rfl: 0   rizatriptan (MAXALT) 10 MG tablet, Take 1 tablet (10 mg total) by mouth as needed for migraine. May repeat in 2 hours if needed, Disp: 10 tablet, Rfl: 2  BP 120/80 (BP Location: Left Arm, Patient Position: Sitting, Cuff Size: Large)   Pulse 78   Temp 98.2 F (36.8 C) (Oral)   Ht 6' (1.829 m)   Wt 216 lb 2 oz (98 kg)   SpO2 98%   BMI 29.31 kg/m  Gen- awake, alert, appears stated age Heart- RRR, no murmurs, 2+ LE edema b/l  Lungs- CTAB, normal effort w/o accessory muscle use MSK-no focal calf pain Psych: Age appropriate judgment and insight  Localized swelling of both lower extremities - Plan: Comprehensive metabolic panel  Weight loss - Plan: TSH  Check above labs.  Per the patient, his surgeon thinks this is related to his back but wants to make sure is nothing else.  He is trying to stay active, PE/DVT is unlikely in this situation.  Well score -2.   Consider elevation of the legs and compression stockings.  Activity will likely improve after his surgery. F/u prn. Pt voiced understanding and agreement to the plan.  [name] [place], DO 02/10/23  4:27 PM

## 2023-02-11 LAB — COMPREHENSIVE METABOLIC PANEL
ALT: 10 U/L (ref 0–53)
AST: 19 U/L (ref 0–37)
Albumin: 4.3 g/dL (ref 3.5–5.2)
Alkaline Phosphatase: 81 U/L (ref 39–117)
BUN: 11 mg/dL (ref 6–23)
CO2: 28 mEq/L (ref 19–32)
Calcium: 9.1 mg/dL (ref 8.4–10.5)
Chloride: 101 mEq/L (ref 96–112)
Creatinine, Ser: 0.9 mg/dL (ref 0.40–1.50)
GFR: 83.49 mL/min (ref >60.00)
Glucose, Bld: 84 mg/dL (ref 70–99)
Potassium: 3.9 mEq/L (ref 3.5–5.1)
Sodium: 139 mEq/L (ref 135–145)
Total Bilirubin: 1 mg/dL (ref 0.2–1.2)
Total Protein: 6.9 g/dL (ref 6.0–8.3)

## 2023-02-11 LAB — TSH: TSH: 1.2 u[IU]/mL (ref 0.35–5.50)

## 2023-02-15 ENCOUNTER — Other Ambulatory Visit: Payer: Self-pay | Admitting: Neurological Surgery

## 2023-03-01 NOTE — Progress Notes (Signed)
Surgical Instructions    Your procedure is scheduled on March 15, 2023 .  Report to [facility] Main Entrance "A" at 10:10 A.M., then check in with the Admitting office.  Call this number if you have problems the morning of surgery:  [phone]  If you have any questions prior to your surgery date call [phone]: Open Monday-Friday 8am-4pm If you experience any cold or flu symptoms such as cough, fever, chills, shortness of breath, etc. between now and your scheduled surgery, please notify us at the above number.     Remember:  Do not eat after midnight the night before your surgery  You may drink clear liquids until 9:10 the morning of your surgery.   Clear liquids allowed are: Water, Non-Citrus Juices (without pulp), Carbonated Beverages, Clear Tea, Black Coffee Only (NO MILK, CREAM OR POWDERED CREAMER of any kind), and Gatorade.    Take these medicines the morning of surgery with A SIP OF WATER     pantoprazole (PROTONIX)   FLUoxetine (PROZAC)    hydrOXYzine (VISTARIL)  If needed  nitroGLYCERIN (NITROSTAT) If Needed ALPRAZolam Prudy Feeler)  If needed   As of today, STOP taking any Aspirin (unless otherwise instructed by your surgeon) Aleve, Naproxen, Ibuprofen, Motrin, Advil, Goody's, BC's, all herbal medications, fish oil, and all vitamin   Do NOT Smoke (Tobacco/Vaping) for 24 hours prior to your procedure.  If you use a CPAP at night, you may bring your mask/headgear for your overnight stay.   Contacts, glasses, piercing's, hearing aid's, dentures or partials may not be worn into surgery, please bring cases for these belongings.    For patients admitted to the hospital, discharge time will be determined by your treatment team.   Patients discharged the day of surgery will not be allowed to drive home, and someone needs to stay with them for 24 hours.  SURGICAL WAITING ROOM VISITATION Patients having surgery or a procedure may have no more than 2 support people in the  waiting area - these visitors may rotate.   Children under the age of 73 must have an adult with them who is not the patient. If the patient needs to stay at the hospital during part of their recovery, the visitor guidelines for inpatient rooms apply. Pre-op nurse will coordinate an appropriate time for 1 support person to accompany patient in pre-op.  This support person may not rotate.   Please refer to the [facility] website for the visitor guidelines for Inpatients (after your surgery is over and you are in a regular room).    Special instructions:   [place]- Preparing For Surgery  Before surgery, you can play an important role. Because skin is not sterile, your skin needs to be as free of germs as possible. You can reduce the number of germs on your skin by washing with CHG (chlorahexidine gluconate) Soap before surgery.  CHG is an antiseptic cleaner which kills germs and bonds with the skin to continue killing germs even after washing.    Oral Hygiene is also important to reduce your risk of infection.  Remember - BRUSH YOUR TEETH THE MORNING OF SURGERY WITH YOUR REGULAR TOOTHPASTE  Please do not use if you have an allergy to CHG or antibacterial soaps. If your skin becomes reddened/irritated stop using the CHG.  Do not shave (including legs and underarms) for at least 48 hours prior to first CHG shower. It is OK to shave your face.   Pre-operative 5 CHG Bath Instructions   You  can play a key role in reducing the risk of infection after surgery. Your skin needs to be as free of germs as possible. You can reduce the number of germs on your skin by washing with CHG (chlorhexidine gluconate) soap before surgery. CHG is an antiseptic soap that kills germs and continues to kill germs even after washing.   DO NOT use if you have an allergy to chlorhexidine/CHG or antibacterial soaps. If your skin becomes reddened or irritated, stop using the CHG and notify one of our RNs at [phone].    Please shower with the CHG soap starting 4 days before surgery using the following schedule:     Please keep in mind the following:  DO NOT shave, including legs and underarms, starting the day of your first shower.   You may shave your face at any point before/day of surgery.  Place clean sheets on your bed the day you start using CHG soap. Use a clean washcloth (not used since being washed) for each shower. DO NOT sleep with pets once you start using the CHG.   CHG Shower Instructions:  If you choose to wash your hair and private area, wash first with your normal shampoo/soap.  After you use shampoo/soap, rinse your hair and body thoroughly to remove shampoo/soap residue.  Turn the water OFF and apply about 3 tablespoons (45 ml) of CHG soap to a CLEAN washcloth.  Apply CHG soap ONLY FROM YOUR NECK DOWN TO YOUR TOES (washing for 3-5 minutes)  DO NOT use CHG soap on face, private areas, open wounds, or sores.  Pay special attention to the area where your surgery is being performed.  If you are having back surgery, having someone wash your back for you may be helpful. Wait 2 minutes after CHG soap is applied, then you may rinse off the CHG soap.  Pat dry with a clean towel  Put on clean clothes/pajamas   If you choose to wear lotion, please use ONLY the CHG-compatible lotions on the back of this paper.     Additional instructions for the day of surgery: DO NOT APPLY any lotions, deodorants, cologne, or perfumes. Do not wear jewelry or makeup  Do not wear nail polish, gel polish, artificial nails, or any other type of covering on natural nails (fingers and toes) Do not bring valuables to the hospital. [facility] is not responsible for any belongings or valuables.  Put on clean/comfortable clothes.  Brush your teeth.  Ask your nurse before applying any prescription medications to the skin.      CHG Compatible Lotions   Aveeno Moisturizing lotion  Cetaphil Moisturizing  Cream  Cetaphil Moisturizing Lotion  Clairol Herbal Essence Moisturizing Lotion, Dry Skin  Clairol Herbal Essence Moisturizing Lotion, Extra Dry Skin  Clairol Herbal Essence Moisturizing Lotion, Normal Skin  Curel Age Defying Therapeutic Moisturizing Lotion with Alpha Hydroxy  Curel Extreme Care Body Lotion  Curel Soothing Hands Moisturizing Hand Lotion  Curel Therapeutic Moisturizing Cream, Fragrance-Free  Curel Therapeutic Moisturizing Lotion, Fragrance-Free  Curel Therapeutic Moisturizing Lotion, Original Formula  Eucerin Daily Replenishing Lotion  Eucerin Dry Skin Therapy Plus Alpha Hydroxy Crme  Eucerin Dry Skin Therapy Plus Alpha Hydroxy Lotion  Eucerin Original Crme  Eucerin Original Lotion  Eucerin Plus Crme Eucerin Plus Lotion  Eucerin TriLipid Replenishing Lotion  Keri Anti-Bacterial Hand Lotion  Keri Deep Conditioning Original Lotion Dry Skin Formula Softly Scented  Keri Deep Conditioning Original Lotion, Fragrance Free Sensitive Skin Formula  Keri Lotion Fast Absorbing  Fragrance Free Sensitive Skin Formula  Keri Lotion Fast Absorbing Softly Scented Dry Skin Formula  Keri Original Lotion  Keri Skin Renewal Lotion Keri Silky Smooth Lotion  Keri Silky Smooth Sensitive Skin Lotion  Nivea Body Creamy Conditioning Oil  Nivea Body Extra Enriched Lotion  Nivea Body Original Lotion  Nivea Body Sheer Moisturizing Lotion Nivea Crme  Nivea Skin Firming Lotion  NutraDerm 30 Skin Lotion  NutraDerm Skin Lotion  NutraDerm Therapeutic Skin Cream  NutraDerm Therapeutic Skin Lotion  ProShield Protective Hand Cream  Provon moisturizing lotion     Please read over the following fact sheets that you were given.    If you received a COVID test during your pre-op visit  it is requested that you wear a mask when out in public, stay away from anyone that may not be feeling well and notify your surgeon if you develop symptoms. If you have been in contact with anyone that has tested  positive in the last 10 days please notify you surgeon.

## 2023-03-02 ENCOUNTER — Encounter (HOSPITAL_COMMUNITY)
Admission: RE | Admit: 2023-03-02 | Discharge: 2023-03-02 | Disposition: A | Discharge status: Home / Self Care | Payer: Medicare Other | Source: Ambulatory Visit | Attending: Neurological Surgery | Admitting: Neurological Surgery

## 2023-03-02 ENCOUNTER — Encounter (HOSPITAL_COMMUNITY): Payer: Self-pay

## 2023-03-02 ENCOUNTER — Other Ambulatory Visit: Payer: Self-pay

## 2023-03-02 VITALS — BP 138/77 | HR 56 | Temp 97.8°F | Resp 19 | Ht 72.0 in | Wt 211.0 lb

## 2023-03-02 DIAGNOSIS — K219 Gastro-esophageal reflux disease without esophagitis: Secondary | ICD-10-CM | POA: Diagnosis not present

## 2023-03-02 DIAGNOSIS — Z01812 Encounter for preprocedural laboratory examination: Secondary | ICD-10-CM | POA: Diagnosis not present

## 2023-03-02 DIAGNOSIS — F419 Anxiety disorder, unspecified: Secondary | ICD-10-CM | POA: Insufficient documentation

## 2023-03-02 DIAGNOSIS — J984 Other disorders of lung: Secondary | ICD-10-CM | POA: Diagnosis not present

## 2023-03-02 DIAGNOSIS — Z01818 Encounter for other preprocedural examination: Secondary | ICD-10-CM

## 2023-03-02 DIAGNOSIS — F4310 Post-traumatic stress disorder, unspecified: Secondary | ICD-10-CM | POA: Insufficient documentation

## 2023-03-02 DIAGNOSIS — I2510 Atherosclerotic heart disease of native coronary artery without angina pectoris: Secondary | ICD-10-CM | POA: Insufficient documentation

## 2023-03-02 HISTORY — DX: Atherosclerotic heart disease of native coronary artery without angina pectoris: I25.10

## 2023-03-02 HISTORY — DX: Post-traumatic stress disorder, unspecified: F43.10

## 2023-03-02 HISTORY — DX: Anxiety disorder, unspecified: F41.9

## 2023-03-02 LAB — CBC
HCT: 43.9 % (ref 39.0–52.0)
Hemoglobin: 14.8 g/dL (ref 13.0–17.0)
MCH: 30.4 pg (ref 26.0–34.0)
MCHC: 33.7 g/dL (ref 30.0–36.0)
MCV: 90.1 fL (ref 80.0–100.0)
Platelets: 247 10*3/uL (ref 150–400)
RBC: 4.87 MIL/uL (ref 4.22–5.81)
RDW: 13.2 % (ref 11.5–15.5)
WBC: 7.1 10*3/uL (ref 4.0–10.5)
nRBC: 0 % (ref 0.0–0.2)

## 2023-03-02 LAB — BASIC METABOLIC PANEL
Anion gap: 13 (ref 5–15)
BUN: 10 mg/dL (ref 8–23)
CO2: 26 mmol/L (ref 22–32)
Calcium: 9.4 mg/dL (ref 8.9–10.3)
Chloride: 102 mmol/L (ref 98–111)
Creatinine, Ser: 0.95 mg/dL (ref 0.61–1.24)
GFR, Estimated: >60 mL/min (ref >60)
Glucose, Bld: 87 mg/dL (ref 70–99)
Potassium: 3.9 mmol/L (ref 3.5–5.1)
Sodium: 141 mmol/L (ref 135–145)

## 2023-03-02 LAB — TYPE AND SCREEN
ABO/RH(D): O POS
Antibody Screen: NEGATIVE

## 2023-03-02 LAB — SURGICAL PCR SCREEN
MRSA, PCR: NEGATIVE
Staphylococcus aureus: POSITIVE — AB

## 2023-03-02 NOTE — Progress Notes (Signed)
PCP - Arva Chafe MD Cardiologist - Patient saw Dr. Bary Castilla 8 + years ago for CP workup negative prn follow up  PPM/ICD - denies  Device Orders - n/a Rep Notified - na/  Chest x-ray - 06-04-22 EKG - 06-04-22 Stress Test - 06-09-17 ECHO - denies Cardiac Cath -   Sleep Study - denies CPAP - n/a  DM- denies  Last dose of GLP1 agonist-  n/a GLP1 instructions: n/a  Blood Thinner Instructions: n/a Aspirin Instructions:n/a  ERAS Protcol - clear liquids till 9:10 A.M. PRE-SURGERY Ensure or G2- n/a  COVID TEST- n/a   Anesthesia review: yes. Hx of cardiac testing including cath 2018 [facility] no current cardiac symptoms  Patient denies shortness of breath, fever, cough and chest pain at PAT appointment- patient denies respiratory symptoms past two months   All instructions explained to the patient, with a verbal understanding of the material. Patient agrees to go over the instructions while at home for a better understanding. Patient also instructed to self quarantine after being tested for COVID-19. The opportunity to ask questions was provided.

## 2023-03-03 ENCOUNTER — Encounter (HOSPITAL_COMMUNITY): Payer: Self-pay

## 2023-03-03 NOTE — Anesthesia Preprocedure Evaluation (Addendum)
Anesthesia Evaluation  Patient identified by MRN, date of birth, ID band Patient awake    Reviewed: Allergy & Precautions, H&P , NPO status , Patient's Chart, lab work & pertinent test results  Airway Mallampati: II   Neck ROM: full    Dental   Pulmonary neg pulmonary ROS   breath sounds clear to auscultation       Cardiovascular + CAD   Rhythm:regular Rate:Normal  Nuclear stress 03/31/22 ([facility]):  Impression: No evidence of reversible ischemia.  Ejection fraction is calculated at 67% during stress.     Neuro/Psych  PSYCHIATRIC DISORDERS Anxiety Depression       GI/Hepatic ,GERD  ,,  Endo/Other    Renal/GU      Musculoskeletal  (+) Arthritis ,    Abdominal   Peds  Hematology   Anesthesia Other Findings   Reproductive/Obstetrics                             Anesthesia Physical Anesthesia Plan  ASA: 3  Anesthesia Plan: General   Post-op Pain Management:    Induction: Intravenous  PONV Risk Score and Plan: 2 and Ondansetron, Dexamethasone, Midazolam and Treatment may vary due to age or medical condition  Airway Management Planned: Oral ETT and Video Laryngoscope Planned  Additional Equipment:   Intra-op Plan:   Post-operative Plan: Extubation in OR  Informed Consent: I have reviewed the patients History and Physical, chart, labs and discussed the procedure including the risks, benefits and alternatives for the proposed anesthesia with the patient or authorized representative who has indicated his/her understanding and acceptance.     Dental advisory given  Plan Discussed with: CRNA, Anesthesiologist and Surgeon  Anesthesia Plan Comments: (PAT note written 03/03/2023 by Shonna Chock, PA-C.  )       Anesthesia Quick Evaluation

## 2023-03-03 NOTE — Progress Notes (Signed)
Anesthesia Chart Review:  Case: [id number] Date/Time: 03/15/23 1158   Procedure: C4-5 C5-6 ACDF - RM 21 TO FOLLOW   Anesthesia type: General   Pre-op diagnosis: Disease of spinal cord   Location: [facility] OR ROOM 19 / [facility] OR   Surgeons: Barnett Abu, MD       DISCUSSION: Patient is a 77 year old male scheduled for the above procedure.   History includes never smoker, CAD ("Nonobstructive CAD, normal fractional flow reserve measured" of ~ 50% LAD 06/19/17), HLD, PTSD, GERD, anxiety, prostatitis, RSD (right), spinal surgery (1998 x2), cholecystectomy.   He had a non-ischemic stress test through the [facility] on 03/31/22. Also he saw community PCP Dr. Carmelia Roller for LE edema on 02/10/23 which was attributed to his back. No calf pain or SOB. Patient not immobile and trying to stay active. Lungs were clear. He recommended elevation of legs and compression stocking and felt it would improved after surgery as he becomes more active. TSH was normal. He denied chest pain and SOB at PAT RN visit.   Recent PCP evaluation and non-ischemic stress test within the past year. Anesthesia team to evaluate on the day of surgery.     VS: BP 138/77   Pulse (!) 56   Temp 36.6 C   Resp 19   Ht 6' (1.829 m)   Wt 95.7 kg   SpO2 98%   BMI 28.62 kg/m    PROVIDERS: Sharlene Dory, DO is PCP  - Prior cardiology evaluation by Merrily Pew, MD. S/p LHC 06/19/17 showing ~ 50% with "normal" FFR. Medical therapy recommended. Last visit seen is from 06/13/18. He did have a normal stress test through the [facility] in July 2023.    LABS: Labs reviewed: Acceptable for surgery. (all labs ordered are listed, but only abnormal results are displayed)  Labs Reviewed  SURGICAL PCR SCREEN - Abnormal; Notable for the following components:      Result Value   Staphylococcus aureus POSITIVE (*)    All other components within normal limits  BASIC METABOLIC PANEL  CBC  TYPE AND SCREEN  TSH normal at 1.20 on 02/10/23. A1c 5.9% on  10/22/22 at [facility].   IMAGES: MRI C-spine (Canopy/PACS): IMPRESSION: 1. Widespread cervical spine degeneration, with mild multilevel spondylolisthesis. Acute Exacerbation of chronic facet joint Arthritis on the Left at C4-C5. Moderate multifactorial spinal stenosis AND Moderate spinal cord mass effect at both C4-C5 and C5-C6. Difficult to exclude early Myelomalacia of the intervening spinal cord. 2. Mild cervical spinal stenosis C2-C3 and C3-C4. And moderate or severe degenerative neural foraminal stenosis at the right C4, bilateral C5, C6, and C8 nerve levels.   MRI L-spine (Canopy/PACS): IMPRESSION: 1. 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 Superimposed scoliosis and reversed lordosis. No acute osseous abnormality. 2. Despite that, Moderate multifactorial spinal and right lateral recess stenosis at L2-L3, and mild spinal stenosis at L3-L4. 3. Mild multifactorial spinal stenosis at the unfused T12-L1 and L1-L2 levels. No associated conus medullaris mass effect or signal abnormality.   MRI Brain 09/02/22: IMPRESSION: Stable nonenhancing 5 mm right tectal lesion, again most likely benign. 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   CXR 06/04/22: FINDINGS: Heart size and mediastinal contours are stable, with stable appearance of the previously described tortuous configuration of the thoracic aorta. Lungs are clear. No pleural effusion or pneumothorax is seen. Degenerative spondylosis of the thoracic spine, mild to moderate in degree. No  acute-appearing osseous abnormality. IMPRESSION: No active cardiopulmonary disease. No evidence of pneumonia or pulmonary edema.    EKG: 06/04/22: NSR. Pulmonary disease pattern. Incomplete RBBB. LAFB. - Overall, I think EKG is stable when compared to 11/25/21 tracing.   CV: Nuclear stress 03/31/22 [facility]):  Impression: No evidence of  reversible ischemia.  Ejection fraction is calculated at 67% during stress.   Cardiac cath 06/09/17 ([facility], Dr. Merrily Pew): Angiographic Findings  Cardiac Arteries and Lesion Findings  - LMCA: Normal appearance with 0% stenosis.  - LAD: Focal stenosis. Lesion on Mid LAD: Proximal subsection.50% stenosis 12 mm length .  - LCx: Normal appearance with 0% stenosis.  - RCA: Normal appearance with 0% stenosis.  Summary: Lesion in prox/mid LAD not much changed.--50%  Normal Cx and RCA.  Normal LV--60%  Recommendations:  Medical Therapy because no interventional therapy is required.    Past Medical History:  Diagnosis Date   Allergy    Anxiety    Arthritis    Back pain, lumbosacral    Back pain, thoracic    Coronary artery disease    normal RCA, LCX, 50% prox/mid LAD with "normal" FFR 06/09/17 LHC, medical therapy   Depression    Eczema    GERD (gastroesophageal reflux disease)    Prostatitis    PTSD (post-traumatic stress disorder)    RSD (reflex sympathetic dystrophy)    on the right side   Vertigo     Past Surgical History:  Procedure Laterality Date   BACK SURGERY  march 1998 and december 1998   carpel tunnel Left    cholecystectectomy     CHOLECYSTECTOMY     ESOPHAGEAL DILATION     stretching of the esophagus   ESOPHAGEAL MANOMETRY N/A 07/01/2022   Procedure: ESOPHAGEAL MANOMETRY (EM);  Surgeon: Shellia Cleverly, DO;  Location: [facility];  Service: Gastroenterology;  Laterality: N/A;   NASAL SINUS SURGERY     NASAL SINUS SURGERY     thumb joint replacement Left 2003   TONSILLECTOMY      MEDICATIONS:  ALPRAZolam (XANAX) 0.5 MG tablet   Calcium-Vitamin D 600-200 MG-UNIT tablet   FLUoxetine (PROZAC) 40 MG capsule   hydrOXYzine (VISTARIL) 25 MG capsule   lovastatin (MEVACOR) 10 MG tablet   metoprolol tartrate (LOPRESSOR) 25 MG tablet   nitroGLYCERIN (NITROSTAT) 0.4 MG SL tablet   pantoprazole (PROTONIX) 40 MG tablet   No current facility-administered  medications for this encounter.    Shonna Chock, PA-C Surgical Short Stay/Anesthesiology [facility] Phone [phone] [facility] Phone [phone] 03/03/2023 7:21 PM

## 2023-03-05 ENCOUNTER — Other Ambulatory Visit: Payer: Self-pay | Admitting: Family Medicine

## 2023-03-16 ENCOUNTER — Inpatient Hospital Stay (HOSPITAL_BASED_OUTPATIENT_CLINIC_OR_DEPARTMENT_OTHER): Payer: Medicare Other | Admitting: Certified Registered Nurse Anesthetist

## 2023-03-16 ENCOUNTER — Encounter (HOSPITAL_COMMUNITY)
Admission: RE | Disposition: A | Discharge status: Home Health | Payer: Self-pay | Source: Home / Self Care | Attending: Neurological Surgery

## 2023-03-16 ENCOUNTER — Other Ambulatory Visit: Payer: Self-pay

## 2023-03-16 ENCOUNTER — Inpatient Hospital Stay (HOSPITAL_COMMUNITY): Payer: Medicare Other | Admitting: Vascular Surgery

## 2023-03-16 ENCOUNTER — Observation Stay (HOSPITAL_COMMUNITY)
Admission: RE | Admit: 2023-03-16 | Discharge: 2023-03-17 | Disposition: A | Discharge status: Home Health | Payer: Medicare Other | Attending: Neurological Surgery | Admitting: Neurological Surgery

## 2023-03-16 ENCOUNTER — Inpatient Hospital Stay (HOSPITAL_COMMUNITY): Payer: Medicare Other

## 2023-03-16 DIAGNOSIS — I2510 Atherosclerotic heart disease of native coronary artery without angina pectoris: Secondary | ICD-10-CM | POA: Diagnosis not present

## 2023-03-16 DIAGNOSIS — M4802 Spinal stenosis, cervical region: Secondary | ICD-10-CM | POA: Diagnosis not present

## 2023-03-16 DIAGNOSIS — Z91040 Latex allergy status: Secondary | ICD-10-CM | POA: Diagnosis not present

## 2023-03-16 DIAGNOSIS — Z79899 Other long term (current) drug therapy: Secondary | ICD-10-CM | POA: Diagnosis not present

## 2023-03-16 DIAGNOSIS — E785 Hyperlipidemia, unspecified: Secondary | ICD-10-CM

## 2023-03-16 DIAGNOSIS — M4712 Other spondylosis with myelopathy, cervical region: Secondary | ICD-10-CM | POA: Diagnosis not present

## 2023-03-16 DIAGNOSIS — G959 Disease of spinal cord, unspecified: Secondary | ICD-10-CM

## 2023-03-16 DIAGNOSIS — F411 Generalized anxiety disorder: Secondary | ICD-10-CM | POA: Diagnosis not present

## 2023-03-16 HISTORY — PX: ANTERIOR CERVICAL DECOMP/DISCECTOMY FUSION: SHX1161

## 2023-03-16 LAB — ABO/RH: ABO/RH(D): O POS

## 2023-03-16 SURGERY — ANTERIOR CERVICAL DECOMPRESSION/DISCECTOMY FUSION 2 LEVELS
Anesthesia: General

## 2023-03-16 MED ORDER — ACETAMINOPHEN 650 MG RE SUPP: 650.0000 mg | RECTAL | Status: DC | PRN

## 2023-03-16 MED ORDER — CHLORHEXIDINE GLUCONATE CLOTH 2 % EX PADS: 6.0000 | MEDICATED_PAD | Freq: Once | CUTANEOUS | Status: DC

## 2023-03-16 MED ORDER — PHENYLEPHRINE HCL-NACL 20-0.9 MG/250ML-% IV SOLN
INTRAVENOUS | Status: AC
  Filled 2023-03-16: qty 500

## 2023-03-16 MED ORDER — ONDANSETRON HCL 4 MG/2ML IJ SOLN: 4.0000 mg | Freq: Four times a day (QID) | INTRAMUSCULAR | Status: DC | PRN

## 2023-03-16 MED ORDER — SENNA 8.6 MG PO TABS
1.0000 | ORAL_TABLET | Freq: Two times a day (BID) | ORAL | Status: DC
  Administered 2023-03-16 – 2023-03-17 (×2): 8.6 mg via ORAL
  Filled 2023-03-16 (×2): qty 1

## 2023-03-16 MED ORDER — FENTANYL CITRATE (PF) 100 MCG/2ML IJ SOLN: 25.0000 ug | INTRAMUSCULAR | Status: DC | PRN

## 2023-03-16 MED ORDER — LACTATED RINGERS IV SOLN: INTRAVENOUS | Status: DC

## 2023-03-16 MED ORDER — ONDANSETRON HCL 4 MG/2ML IJ SOLN
INTRAMUSCULAR | Status: AC
  Filled 2023-03-16: qty 2

## 2023-03-16 MED ORDER — LIDOCAINE 2% (20 MG/ML) 5 ML SYRINGE
INTRAMUSCULAR | Status: AC
  Filled 2023-03-16: qty 5

## 2023-03-16 MED ORDER — DEXAMETHASONE SODIUM PHOSPHATE 10 MG/ML IJ SOLN
INTRAMUSCULAR | Status: DC | PRN
  Administered 2023-03-16: 10 mg via INTRAVENOUS

## 2023-03-16 MED ORDER — LIDOCAINE 2% (20 MG/ML) 5 ML SYRINGE
INTRAMUSCULAR | Status: DC | PRN
  Administered 2023-03-16: 80 mg via INTRAVENOUS

## 2023-03-16 MED ORDER — PROPOFOL 10 MG/ML IV BOLUS
INTRAVENOUS | Status: AC
  Filled 2023-03-16: qty 20

## 2023-03-16 MED ORDER — PANTOPRAZOLE SODIUM 40 MG PO TBEC
40.0000 mg | DELAYED_RELEASE_TABLET | Freq: Two times a day (BID) | ORAL | Status: DC
  Administered 2023-03-16 – 2023-03-17 (×2): 40 mg via ORAL
  Filled 2023-03-16 (×2): qty 1

## 2023-03-16 MED ORDER — LACTATED RINGERS IV SOLN: INTRAVENOUS | Status: DC | PRN

## 2023-03-16 MED ORDER — EPHEDRINE 5 MG/ML INJ
INTRAVENOUS | Status: AC
  Filled 2023-03-16: qty 5

## 2023-03-16 MED ORDER — ROCURONIUM BROMIDE 10 MG/ML (PF) SYRINGE
PREFILLED_SYRINGE | INTRAVENOUS | Status: DC | PRN
  Administered 2023-03-16: 50 mg via INTRAVENOUS

## 2023-03-16 MED ORDER — MIDAZOLAM HCL 2 MG/2ML IJ SOLN
INTRAMUSCULAR | Status: DC | PRN
  Administered 2023-03-16: 2 mg via INTRAVENOUS

## 2023-03-16 MED ORDER — NITROGLYCERIN 0.4 MG SL SUBL: 0.4000 mg | SUBLINGUAL_TABLET | SUBLINGUAL | Status: DC | PRN

## 2023-03-16 MED ORDER — BUPIVACAINE HCL (PF) 0.5 % IJ SOLN
INTRAMUSCULAR | Status: DC | PRN
  Administered 2023-03-16: 2.5 mL

## 2023-03-16 MED ORDER — PROPOFOL 10 MG/ML IV BOLUS
INTRAVENOUS | Status: DC | PRN
  Administered 2023-03-16: 130 mg via INTRAVENOUS

## 2023-03-16 MED ORDER — SUCCINYLCHOLINE CHLORIDE 200 MG/10ML IV SOSY
PREFILLED_SYRINGE | INTRAVENOUS | Status: AC
  Filled 2023-03-16: qty 10

## 2023-03-16 MED ORDER — ACETAMINOPHEN 325 MG PO TABS: 650.0000 mg | ORAL_TABLET | ORAL | Status: DC | PRN

## 2023-03-16 MED ORDER — MUPIROCIN 2 % EX OINT
TOPICAL_OINTMENT | Freq: Two times a day (BID) | CUTANEOUS | Status: DC
  Filled 2023-03-16: qty 22

## 2023-03-16 MED ORDER — HYDROMORPHONE HCL 1 MG/ML IJ SOLN: 0.5000 mg | INTRAMUSCULAR | Status: DC | PRN

## 2023-03-16 MED ORDER — BISACODYL 10 MG RE SUPP: 10.0000 mg | Freq: Every day | RECTAL | Status: DC | PRN

## 2023-03-16 MED ORDER — THROMBIN 5000 UNITS EX SOLR
OROMUCOSAL | Status: DC | PRN
  Administered 2023-03-16: 5 mL via TOPICAL

## 2023-03-16 MED ORDER — ONDANSETRON HCL 4 MG/2ML IJ SOLN
INTRAMUSCULAR | Status: DC | PRN
  Administered 2023-03-16: 4 mg via INTRAVENOUS

## 2023-03-16 MED ORDER — HYDROXYZINE HCL 25 MG PO TABS
25.0000 mg | ORAL_TABLET | Freq: Three times a day (TID) | ORAL | Status: DC | PRN
  Administered 2023-03-16 – 2023-03-17 (×2): 25 mg via ORAL
  Filled 2023-03-16 (×3): qty 1

## 2023-03-16 MED ORDER — PROPOFOL 500 MG/50ML IV EMUL
INTRAVENOUS | Status: DC | PRN
  Administered 2023-03-16: 50 ug/kg/min via INTRAVENOUS

## 2023-03-16 MED ORDER — CHLORHEXIDINE GLUCONATE 0.12 % MT SOLN
15.0000 mL | Freq: Once | OROMUCOSAL | Status: AC
  Administered 2023-03-16: 15 mL via OROMUCOSAL
  Filled 2023-03-16: qty 15

## 2023-03-16 MED ORDER — SODIUM CHLORIDE 0.9% FLUSH
3.0000 mL | Freq: Two times a day (BID) | INTRAVENOUS | Status: DC
  Administered 2023-03-16: 3 mL via INTRAVENOUS

## 2023-03-16 MED ORDER — MUPIROCIN 2 % EX OINT
TOPICAL_OINTMENT | CUTANEOUS | Status: AC
  Filled 2023-03-16: qty 22

## 2023-03-16 MED ORDER — THROMBIN 5000 UNITS EX SOLR
CUTANEOUS | Status: AC
  Filled 2023-03-16: qty 5000

## 2023-03-16 MED ORDER — METOPROLOL TARTRATE 12.5 MG HALF TABLET
12.5000 mg | ORAL_TABLET | Freq: Two times a day (BID) | ORAL | Status: DC
  Administered 2023-03-16 – 2023-03-17 (×2): 12.5 mg via ORAL
  Filled 2023-03-16 (×2): qty 1

## 2023-03-16 MED ORDER — SODIUM CHLORIDE 0.9 % IV SOLN
250.0000 mL | INTRAVENOUS | Status: DC
  Administered 2023-03-16: 250 mL via INTRAVENOUS

## 2023-03-16 MED ORDER — PHENOL 1.4 % MT LIQD: 1.0000 | OROMUCOSAL | Status: DC | PRN

## 2023-03-16 MED ORDER — POLYETHYLENE GLYCOL 3350 17 G PO PACK
17.0000 g | PACK | Freq: Every day | ORAL | Status: DC | PRN
  Administered 2023-03-16: 17 g via ORAL
  Filled 2023-03-16: qty 1

## 2023-03-16 MED ORDER — LIDOCAINE-EPINEPHRINE 1 %-1:100000 IJ SOLN
INTRAMUSCULAR | Status: AC
  Filled 2023-03-16: qty 1

## 2023-03-16 MED ORDER — EPHEDRINE SULFATE-NACL 50-0.9 MG/10ML-% IV SOSY
PREFILLED_SYRINGE | INTRAVENOUS | Status: DC | PRN
  Administered 2023-03-16: 5 mg via INTRAVENOUS
  Administered 2023-03-16: 10 mg via INTRAVENOUS
  Administered 2023-03-16: 5 mg via INTRAVENOUS

## 2023-03-16 MED ORDER — ORAL CARE MOUTH RINSE: 15.0000 mL | Freq: Once | OROMUCOSAL | Status: AC

## 2023-03-16 MED ORDER — 0.9 % SODIUM CHLORIDE (POUR BTL) OPTIME
TOPICAL | Status: DC | PRN
  Administered 2023-03-16: 1000 mL

## 2023-03-16 MED ORDER — MIDAZOLAM HCL 2 MG/2ML IJ SOLN
INTRAMUSCULAR | Status: AC
  Filled 2023-03-16: qty 2

## 2023-03-16 MED ORDER — FENTANYL CITRATE (PF) 250 MCG/5ML IJ SOLN
INTRAMUSCULAR | Status: DC | PRN
  Administered 2023-03-16: 100 ug via INTRAVENOUS
  Administered 2023-03-16 (×2): 25 ug via INTRAVENOUS

## 2023-03-16 MED ORDER — ALPRAZOLAM 0.5 MG PO TABS
0.5000 mg | ORAL_TABLET | Freq: Three times a day (TID) | ORAL | Status: DC | PRN
  Administered 2023-03-16 (×2): 0.5 mg via ORAL
  Filled 2023-03-16 (×2): qty 1

## 2023-03-16 MED ORDER — METHOCARBAMOL 500 MG PO TABS
500.0000 mg | ORAL_TABLET | Freq: Four times a day (QID) | ORAL | Status: DC | PRN
  Administered 2023-03-16 – 2023-03-17 (×4): 500 mg via ORAL
  Filled 2023-03-16 (×4): qty 1

## 2023-03-16 MED ORDER — PHENYLEPHRINE HCL-NACL 20-0.9 MG/250ML-% IV SOLN
INTRAVENOUS | Status: DC | PRN
  Administered 2023-03-16: 40 ug/min via INTRAVENOUS

## 2023-03-16 MED ORDER — CEFAZOLIN SODIUM-DEXTROSE 2-4 GM/100ML-% IV SOLN
2.0000 g | INTRAVENOUS | Status: AC
  Administered 2023-03-16: 2 g via INTRAVENOUS
  Filled 2023-03-16: qty 100

## 2023-03-16 MED ORDER — PHENYLEPHRINE 80 MCG/ML (10ML) SYRINGE FOR IV PUSH (FOR BLOOD PRESSURE SUPPORT)
PREFILLED_SYRINGE | INTRAVENOUS | Status: AC
  Filled 2023-03-16: qty 10

## 2023-03-16 MED ORDER — MENTHOL 3 MG MT LOZG: 1.0000 | LOZENGE | OROMUCOSAL | Status: DC | PRN

## 2023-03-16 MED ORDER — ONDANSETRON HCL 4 MG PO TABS: 4.0000 mg | ORAL_TABLET | Freq: Four times a day (QID) | ORAL | Status: DC | PRN

## 2023-03-16 MED ORDER — DOCUSATE SODIUM 100 MG PO CAPS
100.0000 mg | ORAL_CAPSULE | Freq: Two times a day (BID) | ORAL | Status: DC
  Administered 2023-03-16 – 2023-03-17 (×2): 100 mg via ORAL
  Filled 2023-03-16 (×2): qty 1

## 2023-03-16 MED ORDER — FLUOXETINE HCL 20 MG PO CAPS
40.0000 mg | ORAL_CAPSULE | Freq: Every day | ORAL | Status: DC
  Administered 2023-03-17: 40 mg via ORAL
  Filled 2023-03-16: qty 2

## 2023-03-16 MED ORDER — HYDROCODONE-ACETAMINOPHEN 5-325 MG PO TABS
1.0000 | ORAL_TABLET | ORAL | Status: DC | PRN
  Administered 2023-03-16 – 2023-03-17 (×4): 1 via ORAL
  Filled 2023-03-16: qty 1
  Filled 2023-03-16: qty 2
  Filled 2023-03-16 (×2): qty 1

## 2023-03-16 MED ORDER — METHOCARBAMOL 1000 MG/10ML IJ SOLN: 500.0000 mg | Freq: Four times a day (QID) | INTRAVENOUS | Status: DC | PRN

## 2023-03-16 MED ORDER — FLEET ENEMA 7-19 GM/118ML RE ENEM: 1.0000 | ENEMA | Freq: Once | RECTAL | Status: DC | PRN

## 2023-03-16 MED ORDER — ACETAMINOPHEN 500 MG PO TABS: 1000.0000 mg | ORAL_TABLET | Freq: Four times a day (QID) | ORAL | Status: DC | PRN

## 2023-03-16 MED ORDER — CEFAZOLIN SODIUM-DEXTROSE 2-4 GM/100ML-% IV SOLN
2.0000 g | Freq: Three times a day (TID) | INTRAVENOUS | Status: AC
  Administered 2023-03-16 (×2): 2 g via INTRAVENOUS
  Filled 2023-03-16 (×2): qty 100

## 2023-03-16 MED ORDER — PRAVASTATIN SODIUM 10 MG PO TABS
10.0000 mg | ORAL_TABLET | Freq: Every day | ORAL | Status: DC
  Administered 2023-03-16: 10 mg via ORAL
  Filled 2023-03-16: qty 1

## 2023-03-16 MED ORDER — ROCURONIUM BROMIDE 10 MG/ML (PF) SYRINGE
PREFILLED_SYRINGE | INTRAVENOUS | Status: AC
  Filled 2023-03-16: qty 10

## 2023-03-16 MED ORDER — DEXAMETHASONE SODIUM PHOSPHATE 10 MG/ML IJ SOLN
INTRAMUSCULAR | Status: AC
  Filled 2023-03-16: qty 1

## 2023-03-16 MED ORDER — LIDOCAINE-EPINEPHRINE 1 %-1:100000 IJ SOLN
INTRAMUSCULAR | Status: DC | PRN
  Administered 2023-03-16: 2.5 mL

## 2023-03-16 MED ORDER — SODIUM CHLORIDE 0.9% FLUSH: 3.0000 mL | INTRAVENOUS | Status: DC | PRN

## 2023-03-16 MED ORDER — FENTANYL CITRATE (PF) 250 MCG/5ML IJ SOLN
INTRAMUSCULAR | Status: AC
  Filled 2023-03-16: qty 5

## 2023-03-16 MED ORDER — BUPIVACAINE HCL (PF) 0.5 % IJ SOLN
INTRAMUSCULAR | Status: AC
  Filled 2023-03-16: qty 30

## 2023-03-16 SURGICAL SUPPLY — 56 items
ADH SKN CLS APL DERMABOND .7 (GAUZE/BANDAGES/DRESSINGS) ×1
BAG COUNTER SPONGE SURGICOUNT (BAG) ×1 IMPLANT
BAG SPNG CNTER NS LX DISP (BAG) ×1
BIT DRILL ACP 15 (DRILL) IMPLANT
BIT DRILL NEURO 2X3.1 SFT TUCH (MISCELLANEOUS) ×1 IMPLANT
BNDG GAUZE DERMACEA FLUFF 4 (GAUZE/BANDAGES/DRESSINGS) IMPLANT
BNDG GZE DERMACEA 4 6PLY (GAUZE/BANDAGES/DRESSINGS)
BUR BARREL STRAIGHT FLUTE 4.0 (BURR) IMPLANT
CAGE CERV MOD 7X17X14 7D (Cage) IMPLANT
CAGE CERV MOD 8X17X14 7D (Cage) IMPLANT
CANISTER SUCT 3000ML PPV (MISCELLANEOUS) ×1 IMPLANT
DERMABOND ADVANCED .7 DNX12 (GAUZE/BANDAGES/DRESSINGS) ×1 IMPLANT
DRAPE LAPAROTOMY 100X72 PEDS (DRAPES) ×1 IMPLANT
DRAPE MICROSCOPE SLANT 54X150 (MISCELLANEOUS) IMPLANT
DRILL ACP 15 (DRILL) ×1
DRILL NEURO 2X3.1 SOFT TOUCH (MISCELLANEOUS) ×1
DURAPREP 6ML APPLICATOR 50/CS (WOUND CARE) ×1 IMPLANT
ELECT COATED BLADE 2.86 ST (ELECTRODE) ×1 IMPLANT
ELECT REM PT RETURN 9FT ADLT (ELECTROSURGICAL) ×1
ELECTRODE REM PT RTRN 9FT ADLT (ELECTROSURGICAL) ×1 IMPLANT
GAUZE 4X4 16PLY ~~LOC~~+RFID DBL (SPONGE) IMPLANT
GAUZE SPONGE 4X4 12PLY STRL (GAUZE/BANDAGES/DRESSINGS) ×1 IMPLANT
GLOVE BIOGEL PI IND STRL 8.5 (GLOVE) ×1 IMPLANT
GLOVE ECLIPSE 8.5 STRL (GLOVE) ×1 IMPLANT
GLOVE EXAM NITRILE XL STR (GLOVE) IMPLANT
GOWN STRL REUS W/ TWL LRG LVL3 (GOWN DISPOSABLE) IMPLANT
GOWN STRL REUS W/ TWL XL LVL3 (GOWN DISPOSABLE) IMPLANT
GOWN STRL REUS W/TWL 2XL LVL3 (GOWN DISPOSABLE) ×1 IMPLANT
GOWN STRL REUS W/TWL LRG LVL3 (GOWN DISPOSABLE)
GOWN STRL REUS W/TWL XL LVL3 (GOWN DISPOSABLE)
HALTER HD/CHIN CERV TRACTION D (MISCELLANEOUS) ×1 IMPLANT
HEMOSTAT POWDER KIT SURGIFOAM (HEMOSTASIS) ×1 IMPLANT
KIT BASIN OR (CUSTOM PROCEDURE TRAY) ×1 IMPLANT
KIT TURNOVER KIT B (KITS) ×1 IMPLANT
NDL HYPO 22X1.5 SAFETY MO (MISCELLANEOUS) ×1 IMPLANT
NDL SPNL 22GX3.5 QUINCKE BK (NEEDLE) ×1 IMPLANT
NEEDLE HYPO 22X1.5 SAFETY MO (MISCELLANEOUS) ×1 IMPLANT
NEEDLE SPNL 22GX3.5 QUINCKE BK (NEEDLE) ×1 IMPLANT
NS IRRIG 1000ML POUR BTL (IV SOLUTION) ×1 IMPLANT
PACK LAMINECTOMY NEURO (CUSTOM PROCEDURE TRAY) ×1 IMPLANT
PAD ARMBOARD 7.5X6 YLW CONV (MISCELLANEOUS) ×3 IMPLANT
PATTIES SURGICAL .5 X1 (DISPOSABLE) ×1 IMPLANT
PLATE ACP 1.6X42 2LVL (Plate) IMPLANT
PUTTY DBM PROPEL SM (Putty) IMPLANT
SCREW ACP VA ST 3.5X15 (Screw) IMPLANT
SET WALTER ACTIVATION W/DRAPE (SET/KITS/TRAYS/PACK) ×1 IMPLANT
SOL ELECTROSURG ANTI STICK (MISCELLANEOUS)
SOLUTION ELECTROSURG ANTI STCK (MISCELLANEOUS) ×1 IMPLANT
SPIKE FLUID TRANSFER (MISCELLANEOUS) ×1 IMPLANT
SPONGE INTESTINAL PEANUT (DISPOSABLE) ×1 IMPLANT
SUT VIC AB 4-0 RB1 18 (SUTURE) ×3 IMPLANT
SYR BULB EAR ULCER 3OZ GRN STR (SYRINGE) IMPLANT
TOWEL GREEN STERILE (TOWEL DISPOSABLE) ×1 IMPLANT
TOWEL GREEN STERILE FF (TOWEL DISPOSABLE) ×1 IMPLANT
TUBING FEATHERFLOW (TUBING) ×1 IMPLANT
WATER STERILE IRR 1000ML POUR (IV SOLUTION) ×1 IMPLANT

## 2023-03-16 NOTE — Op Note (Signed)
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 Postoperative diagnosis: Same Procedure: Anterior cervical decompression C4-5 C5-6 arthrodesis with structural spacer and allograft anterior plate fixation [id number]. Surgeon: Barnett Abu First Assistant: Hildred Priest, NP Anesthesia: General endotracheal Indications: Cesar [name] is a 77 year old individual whose had significant difficulty with his gait deteriorating in addition he has had numbness in his hands and clumsiness in his arms that he has been more aware of lately.  An MRI of the cervical spine demonstrates the patient has multilevel spondylitic disease with the worst 2 levels being C4-5 and C5-6 where he has evidence of cord compression with some intrinsic cord change on the MRI.  He has been advised regarding the need for surgical decompression at C4-5 and C5-6 and he is now admitted for this procedure.  Procedure: Patient was brought to the operating room placed on table supine position.  After the smooth induction of general endotracheal anesthesia the neck was prepped with alcohol and ChloraPrep and draped in a sterile fashion.  A transverse incision was made in the left-sided neck after injection with 5 cc of lidocaine with epinephrine mixed 50-50 with half percent Marcaine.  The dissection was taken down through the platysma.  Plane between the sternocleidomastoid and strap muscles dissected bluntly into the prevertebral space was reached.  The first identifiable disc space was noted to be that of C4-5.  Then the Zollie Beckers arm is used to hold the retractor and expose the prevertebral space to allow placement of a self-retaining Caspar type retractor underneath the longus coli muscle at C4-5 and C5-6.  This was also attached to the [facility] arm.  During this time then we then opened the ventral aspect of the disc base at C4-C5 and a discectomy was performed using combination of curettes and rongeurs  I work from the right side while the nurse practitioner work from the left side providing further exposure beyond the retractors and also suction while the discectomy was being performed.  The lateral margins were then decompressed with large uncinate spurs being removed from each of lefts and right side.  This allowed for good decompression of the prevertebral space and gradually we dissected the posterior longitudinal ligament away from the dura.  It was noted to be quite adherent to the dura.  Care was taken not to puncture the dura and no openings into the dura were noted.  Once the prevertebral decompression was completed the interspace was sized for an appropriately sized spacer and was felt that because of the amount of undercutting an 8 mm spacer measuring 17 x 14 mm with 7 degrees lordosis was used at C4-5 this was filled with demineralized bone matrix.  This placed into the interspace retractors were then repositioned to expose C5-6 better.  At C5-6 a similar process was carried out again with the assistant providing suctioning and further retraction beyond the [facility] arm to maintain the exposure while I remove the disc and opened up the posterior longitudinal ligament.  Here there was noted to be substantially larger bone spurs that were removed and these were drilled down with a 2 mm bit.  Hemostasis was well achieved and again the dura was carefully inspected here the attachment to the dura by any scar from the posterior longitudinal ligament was considerably less.  Once the decompression was completed the interspace was sized for a 7 x 17 x 14 mm spacer with 7 degrees lordosis.  This was filled with demineralized bone matrix and placed  into the interspace.  Traction was then removed and the ventral aspect of the vertebral bodies were shaved smoothed and it was felt that a 42 mm ACP plate would fit best over the ventral aspect of the vertebral bodies.  This was then secured with six 3-1/2 x 15 mm  variable angle screws.  Final radiograph identified good position of the hardware.  Hemostasis in the soft tissues was then carefully meticulously obtained and when verified the platysma was closed with 4-0 Vicryl in interrupted fashion and 4-0 Vicryl was used in the subcuticular skin.  Dermabond was used on the skin over this 75 ml blood loss was noted.

## 2023-03-16 NOTE — Anesthesia Procedure Notes (Signed)
Procedure Name: Intubation Date/Time: 03/16/2023 7:57 AM  Performed by: Darryl Nestle, CRNAPre-anesthesia Checklist: Patient identified, Emergency Drugs available, Suction available and Patient being monitored Patient Re-evaluated:Patient Re-evaluated prior to induction Oxygen Delivery Method: Circle system utilized Preoxygenation: Pre-oxygenation with 100% oxygen Induction Type: IV induction Ventilation: Mask ventilation without difficulty Laryngoscope Size: Glidescope and 4 Grade View: Grade I Tube type: Oral Tube size: 7.5 mm Number of attempts: 1 Airway Equipment and Method: Rigid stylet and Video-laryngoscopy Placement Confirmation: ETT inserted through vocal cords under direct vision, positive ETCO2 and breath sounds checked- equal and bilateral Secured at: 23 cm Tube secured with: Tape Dental Injury: Teeth and Oropharynx as per pre-operative assessment  Comments: Performed by Justice Britain

## 2023-03-16 NOTE — H&P (Signed)
Cesar [name] is an 77 y.o. male.   Chief Complaint: Neck shoulder and arm pain with weakness HPI: The patient is a 77 year old individual whose had significant progressive weakness in his arms and his legs difficulty walking addition to chronic back pain which have addressed previously.  An MRI recently disclosed that the patient has severe cord compression at the levels of C4-5 and C5-6.  He has multilevel cervical spondylosis including some mild spinal stenosis at C3-4 and mild spinous stenosis at C6-7 however the predominance of cord compression is at the other 2 levels.  He is admitted now to undergo surgical decompression as he has been expressing the progression of significant weakness in this in his arms.  He gets numbness into his hands that is also been quite disruptive.  This is made self-care difficult for him.  Past Medical History:  Diagnosis Date   Allergy    Anxiety    Arthritis    Back pain, lumbosacral    Back pain, thoracic    Coronary artery disease    normal RCA, LCX, 50% prox/mid LAD with "normal" FFR 06/09/17 LHC, medical therapy   Depression    Eczema    GERD (gastroesophageal reflux disease)    Prostatitis    PTSD (post-traumatic stress disorder)    RSD (reflex sympathetic dystrophy)    on the right side   Vertigo     Past Surgical History:  Procedure Laterality Date   BACK SURGERY  march 1998 and december 1998   carpel tunnel Left    cholecystectectomy     CHOLECYSTECTOMY     ESOPHAGEAL DILATION     stretching of the esophagus   ESOPHAGEAL MANOMETRY N/A 07/01/2022   Procedure: ESOPHAGEAL MANOMETRY (EM);  Surgeon: Shellia Cleverly, DO;  Location: [facility];  Service: Gastroenterology;  Laterality: N/A;   NASAL SINUS SURGERY     NASAL SINUS SURGERY     thumb joint replacement Left 2003   TONSILLECTOMY      Family History  Problem Relation Age of Onset   Other Mother        had a hole in her heart   Pneumonia Father        died with pneumonia    Allergic rhinitis Neg Hx    Angioedema Neg Hx    Asthma Neg Hx    Eczema Neg Hx    Immunodeficiency Neg Hx    Urticaria Neg Hx    Colon cancer Neg Hx    Colon polyps Neg Hx    Stomach cancer Neg Hx    Pancreatic cancer Neg Hx    Rectal cancer Neg Hx    Esophageal cancer Neg Hx    Social History:  reports that he has never smoked. He has never used smokeless tobacco. He reports that he does not drink alcohol and does not use drugs.  Allergies:  Allergies  Allergen Reactions   Formaldehyde Itching and Rash    SOLN   Latex Itching   Sulfa Antibiotics Itching, Nausea And Vomiting and Rash    15 POWD SOLN   Butorphanol Other (See Comments)    Drowsiness    Celecoxib Rash    Flushing    Hydrocodone-Acetaminophen Nausea And Vomiting   Oxycodone-Acetaminophen Nausea And Vomiting   Povidone Iodine Rash   Metformin Hcl Other (See Comments)    *blurred vision, 11/06/2009   Simvastatin Other (See Comments)    Myalgias   Tamsulosin Other (See Comments)    Dyspnea  Nifedipine Nausea Only   Nitroglycerin Rash    Nitroglycerin patch developed rash and blisters from adhesive and latex   Povidone-Iodine Rash   Quaternium-15 Rash   Rofecoxib Nausea Only    Flushing   Tape Itching and Rash    Medications Prior to Admission  Medication Sig Dispense Refill   ALPRAZolam (XANAX) 0.5 MG tablet TAKE 1 TABLET BY MOUTH 3 TIMES A DAY AS NEEDED FOR ANXIETY 270 tablet 1   Calcium-Vitamin D 600-200 MG-UNIT tablet Take 1 tablet by mouth daily.     FLUoxetine (PROZAC) 40 MG capsule Take 1 capsule (40 mg total) by mouth daily. 90 capsule 3   hydrOXYzine (VISTARIL) 25 MG capsule Take 25 mg by mouth 3 (three) times daily as needed for anxiety.     lovastatin (MEVACOR) 10 MG tablet TAKE 1 TABLET BY MOUTH AT BEDTIME FOR CHOLESTEROL 30 tablet 2   metoprolol tartrate (LOPRESSOR) 25 MG tablet TAKE 1/2 TABLET BY MOUTH TWICE DAILY 90 tablet 0   pantoprazole (PROTONIX) 40 MG tablet Take 1 tablet (40 mg  total) by mouth 2 (two) times daily. (Patient taking differently: Take 40 mg by mouth daily.) 90 tablet 3   nitroGLYCERIN (NITROSTAT) 0.4 MG SL tablet Place 1 tablet (0.4 mg total) under the tongue every 5 (five) minutes as needed for chest pain. 30 tablet 2    Results for orders placed or performed during the hospital encounter of 03/16/23 (from the past 48 hour(s))  ABO/Rh     Status: None   Collection Time: 03/16/23  6:30 AM  Result Value Ref Range   ABO/RH(D)      O POS Performed at [facility], [address] [place], [place] [address]    No results found.  Review of Systems  Constitutional:  Positive for activity change.  HENT: Negative.    Eyes: Negative.   Respiratory: Negative.    Cardiovascular: Negative.   Gastrointestinal: Negative.   Genitourinary: Negative.   Musculoskeletal:  Positive for gait problem, myalgias and neck pain.  Hematological: Negative.   Psychiatric/Behavioral: Negative.      Blood pressure (!) 141/84, pulse 74, temperature 97.9 F (36.6 C), temperature source Oral, resp. rate 18, height 6' (1.829 m), weight 92.5 kg. Physical Exam Constitutional:      Appearance: Normal appearance.  HENT:     Head: Normocephalic and atraumatic.     Right Ear: Tympanic membrane, ear canal and external ear normal.     Left Ear: Tympanic membrane, ear canal and external ear normal.     Nose: Nose normal.     Mouth/Throat:     Mouth: Mucous membranes are moist.     Pharynx: Oropharynx is clear.  Eyes:     Extraocular Movements: Extraocular movements intact.     Conjunctiva/sclera: Conjunctivae normal.     Pupils: Pupils are equal, round, and reactive to light.  Neck:     Comments: Creased range of motion turning 30 degrees left and right flexion extension is intact to 50% of normal Cardiovascular:     Rate and Rhythm: Normal rate and regular rhythm.     Pulses: Normal pulses.     Heart sounds: Normal heart sounds.  Pulmonary:     Effort: Pulmonary  effort is normal.     Breath sounds: Normal breath sounds.  Abdominal:     General: Abdomen is flat. Bowel sounds are normal.     Palpations: Abdomen is soft.  Skin:    General: Skin is  warm and dry.     Capillary Refill: Capillary refill takes less than 2 seconds.  Neurological:     Mental Status: He is alert.     Comments: Fuhs weakness in the upper extremities with deltoid 4 out of 5 on the right 5 out of 5 on the left biceps 4 out of 5 on the right 5 out of 5 on the left grip strength is 4 out of 5 bilaterally wrist extensor strength is 4 out of 5 bilaterally intrinsic strength is 4 out of 5 bilaterally.  Lower extremity strength reveals 4 out of 5 strength in iliopsoas quadriceps tibialis anterior and gastrocs patient walks with a moderately wide-based gait and mild spasticity.  Hyperreflexia is noted in the patellae absent reflexes in the Achilles absent reflexes in the biceps and triceps.  Cranial nerve examination is normal.  Psychiatric:        Mood and Affect: Mood normal.        Behavior: Behavior normal.        Thought Content: Thought content normal.        Judgment: Judgment normal.      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  Plan: Anterior cervical decompression C4-5 C5-6.  Stefani Dama, MD 03/16/2023, 7:36 AM

## 2023-03-16 NOTE — Transfer of Care (Signed)
Immediate Anesthesia Transfer of Care Note  Patient: Cesar [name]  Procedure(s) Performed: CERVICAL FOUR-FIVE CERVICAL FIVE-SIX ANTERIOR CERVICAL DECOMPRESSION/DISCECTOMY FUSION  Patient Location: PACU  Anesthesia Type:General  Level of Consciousness: drowsy and patient cooperative  Airway & Oxygen Therapy: Patient Spontanous Breathing and Patient connected to face mask oxygen  Post-op Assessment: Report given to RN  Post vital signs: Reviewed and stable  Last Vitals:  Vitals Value Taken Time  BP 135/71 03/16/23 1100  Temp    Pulse 79 03/16/23 1105  Resp 17 03/16/23 1105  SpO2 100 % 03/16/23 1105  Vitals shown include unvalidated device data.  Last Pain:  Vitals:   03/16/23 0642  TempSrc:   PainSc: 0-No pain         Complications: No notable events documented.

## 2023-03-16 NOTE — Plan of Care (Signed)

## 2023-03-17 ENCOUNTER — Encounter (HOSPITAL_COMMUNITY): Payer: Self-pay | Admitting: Neurological Surgery

## 2023-03-17 DIAGNOSIS — M4712 Other spondylosis with myelopathy, cervical region: Secondary | ICD-10-CM | POA: Diagnosis not present

## 2023-03-17 MED ORDER — HYDROCODONE-ACETAMINOPHEN 5-325 MG PO TABS: 1.0000 | ORAL_TABLET | ORAL | 0 refills | Status: DC | PRN

## 2023-03-17 NOTE — Discharge Instructions (Signed)
Wound Care Leave incision open to air. You may shower. Do not scrub directly on incision.  Do not put any creams, lotions, or ointments on incision. Activity Walk each and every day, increasing distance each day. No lifting greater than 8 lbs.  Avoid bending, arching, and twisting. No driving for 2 weeks; may ride as a passenger locally.  Diet Resume your normal diet.   Call Your Doctor If Any of These Occur Redness, drainage, or swelling at the wound.  Temperature greater than 101 degrees. Severe pain not relieved by pain medication. Incision starts to come apart. Follow Up Appt Call [phone])  for problems.  If you have any hardware placed in your spine, you will need an x-ray before your appointment.

## 2023-03-17 NOTE — Progress Notes (Signed)
Patient alert and oriented, mae's well, voiding adequate amount of urine, swallowing without difficulty, no c/o pain at time of discharge. Patient discharged home with family. Script and discharged instructions given to patient. Patient and family stated understanding of instructions given. Patient has an appointment with Dr. Elsner  

## 2023-03-17 NOTE — Care Management CC44 (Signed)
Condition Code 44 Documentation Completed  Patient Details  Name: Cesar [name] MRN: [id number] Date of Birth: September 11, 1945   Condition Code 44 given:  Yes Patient signature on Condition Code 44 notice:  Yes Documentation of 2 MD's agreement:  Yes Code 44 added to claim:  Yes    Kermit Balo, RN 03/17/2023, 9:43 AM

## 2023-03-17 NOTE — Care Management Obs Status (Signed)
MEDICARE OBSERVATION STATUS NOTIFICATION   Patient Details  Name: Cesar [name] MRN: [id number] Date of Birth: 07/25/1946   Medicare Observation Status Notification Given:  Yes    Kermit Balo, RN 03/17/2023, 9:43 AM

## 2023-03-17 NOTE — Anesthesia Postprocedure Evaluation (Signed)
Anesthesia Post Note  Patient: Cesar [name]  Procedure(s) Performed: CERVICAL FOUR-FIVE CERVICAL FIVE-SIX ANTERIOR CERVICAL DECOMPRESSION/DISCECTOMY FUSION     Patient location during evaluation: PACU Anesthesia Type: General Level of consciousness: awake and alert Pain management: pain level controlled Vital Signs Assessment: post-procedure vital signs reviewed and stable Respiratory status: spontaneous breathing, nonlabored ventilation, respiratory function stable and patient connected to nasal cannula oxygen Cardiovascular status: blood pressure returned to baseline and stable Postop Assessment: no apparent nausea or vomiting Anesthetic complications: no   No notable events documented.  Last Vitals:  Vitals:   03/17/23 0335 03/17/23 0742  BP: 118/69 104/74  Pulse: 71 71  Resp: 18 17  Temp: 36.6 C 36.8 C  SpO2: 98% 99%    Last Pain:  Vitals:   03/17/23 0742  TempSrc: Oral  PainSc:                  Amal Renbarger S

## 2023-03-17 NOTE — Evaluation (Signed)
Physical Therapy Evaluation  Patient Details Name: Cesar [name] MRN: [id number] DOB: Nov 09, 1945 Today's Date: 03/17/2023  History of Present Illness  Pt is a 77 y/o male who presents s/p C4-C6 ACDF on 03/16/2023. PMH significant for PTSD, anxiety/depression, CAD, RSD, BPPV, prior back surgery 1998.   Clinical Impression  Pt admitted with above diagnosis. At the time of PT eval, pt was able to demonstrate transfers and ambulation with gross min guard assist and rollator for support. Pt was educated on precautions, brace application/wearing schedule, appropriate activity progression, and car transfer. Pt currently with functional limitations due to the deficits listed below (see PT Problem List). Pt will benefit from skilled PT to increase their independence and safety with mobility to allow discharge to the venue listed below.          Assistance Recommended at Discharge PRN  If plan is discharge home, recommend the following:  Can travel by private vehicle  A little help with walking and/or transfers;A little help with bathing/dressing/bathroom;Assistance with cooking/housework;Assist for transportation;Help with stairs or ramp for entrance        Equipment Recommendations None recommended by PT  Recommendations for Other Services       Functional Status Assessment Patient has had a recent decline in their functional status and demonstrates the ability to make significant improvements in function in a reasonable and predictable amount of time.     Precautions / Restrictions Precautions Precautions: Fall;Back Precaution Booklet Issued: Yes (comment) Precaution Comments: OT provided handout. Reviewed handout and pt was cued for precautions during functional mobility. Required Braces or Orthoses:  (No brace needed order) Restrictions Weight Bearing Restrictions: No      Mobility  Bed Mobility               General bed mobility comments: Pt declined practicing log roll  technique despite encouragement. Verbally reviewed and referenced the handout. Pt reports he typically does not do this but is able to perform without any problems.    Transfers Overall transfer level: Needs assistance Equipment used: Rollator (4 wheels) Transfers: Sit to/from Stand Sit to Stand: Min guard           General transfer comment: VC's for improved posture and hand placement on seated surface for safety. Pt able to power up to full stand without assistance.    Ambulation/Gait Ambulation/Gait assistance: Min guard Gait Distance (Feet): 175 Feet Assistive device: Rollator (4 wheels) Gait Pattern/deviations: Step-through pattern, Decreased stride length, Trunk flexed Gait velocity: Decreased Gait velocity interpretation: <1.8 ft/sec, indicate of risk for recurrent falls   General Gait Details: VC's for improved posture and maintenance of precautions. Pt ambulating well with the rollator without overt LOB or any noted knee buckling.  Stairs Stairs: Yes Stairs assistance: Min guard Stair Management: Two rails, Step to pattern, Forwards Number of Stairs: 1 (x2) General stair comments: VC's for sequencing and general safety. Pt cued to hold to railings as he typically reaches for door frame, however was not pulling himself up this date. Able to power up to next step leading with LLE.  Wheelchair Mobility     Tilt Bed    Modified Rankin (Stroke Patients Only)       Balance Overall balance assessment: Needs assistance Sitting-balance support: Feet supported, No upper extremity supported Sitting balance-Leahy Scale: Fair     Standing balance support: During functional activity, Bilateral upper extremity supported, Reliant on assistive device for balance Standing balance-Leahy Scale: Poor Standing balance comment: Statically pt can stand  without assist. For dynamic tasks, requires UE support                             Pertinent Vitals/Pain Pain  Assessment Pain Assessment: Faces Faces Pain Scale: Hurts a little bit Pain Location: incision site Pain Descriptors / Indicators: Operative site guarding, Sore Pain Intervention(s): Monitored during session, Limited activity within patient's tolerance, Repositioned    Home Living Family/patient expects to be discharged to:: Private residence Living Arrangements: Alone Available Help at Discharge: Friend(s);Neighbor;Available PRN/intermittently Type of Home: House Home Access: Stairs to enter Entrance Stairs-Rails: None (Typically pulls himself up from door frame) Entrance Stairs-Number of Steps: 1   Home Layout: One level Home Equipment: Rollator (4 wheels);Adaptive equipment (Guardian/Life Alert)      Prior Function Prior Level of Function : Needs assist;Driving             Mobility Comments: Rollator ADLs Comments: Friends/neighbors bring him coffee in the morning, meals. They help with household chores but pt reports independent with bADL's.     Hand Dominance        Extremity/Trunk Assessment   Upper Extremity Assessment Upper Extremity Assessment: Defer to OT evaluation    Lower Extremity Assessment Lower Extremity Assessment: Generalized weakness;RLE deficits/detail RLE Deficits / Details: Baseline R knee pain and reports possible knee surgery in the near future. Bilaterally, pt with poor quad control. No knee buckling noted however knees "snapping" back into extension/borderline hyperextension at times.    Cervical / Trunk Assessment Cervical / Trunk Assessment: Neck Surgery  Communication   Communication: No difficulties  Cognition Arousal/Alertness: Awake/alert Behavior During Therapy: WFL for tasks assessed/performed Overall Cognitive Status: Impaired/Different from baseline Area of Impairment: Safety/judgement, Attention, Problem solving                   Current Attention Level: Sustained     Safety/Judgement: Decreased awareness of  safety, Decreased awareness of deficits   Problem Solving: Slow processing, Requires verbal cues          General Comments      Exercises     Assessment/Plan    PT Assessment Patient needs continued PT services  PT Problem List Decreased strength;Decreased activity tolerance;Decreased mobility;Decreased balance;Decreased knowledge of use of DME;Decreased safety awareness;Decreased knowledge of precautions;Pain       PT Treatment Interventions DME instruction;Stair training;Gait training;Functional mobility training;Therapeutic activities;Therapeutic exercise;Balance training;Patient/family education    PT Goals (Current goals can be found in the Care Plan section)  Acute Rehab PT Goals Patient Stated Goal: Home at d/c PT Goal Formulation: With patient Time For Goal Achievement: 03/24/23 Potential to Achieve Goals: Good    Frequency Min 5X/week     Co-evaluation               AM-PAC PT "6 Clicks" Mobility  Outcome Measure Help needed turning from your back to your side while in a flat bed without using bedrails?: A Little Help needed moving from lying on your back to sitting on the side of a flat bed without using bedrails?: A Little Help needed moving to and from a bed to a chair (including a wheelchair)?: A Little Help needed standing up from a chair using your arms (e.g., wheelchair or bedside chair)?: A Little Help needed to walk in hospital room?: A Little Help needed climbing 3-5 steps with a railing? : A Little 6 Click Score: 18    End of Session Equipment Utilized During  Treatment: Gait belt Activity Tolerance: Patient tolerated treatment well Patient left: in chair;with call bell/phone within reach Nurse Communication: Mobility status PT Visit Diagnosis: Unsteadiness on feet (R26.81);Pain Pain - part of body:  (neck)    Time: 8657-8469 PT Time Calculation (min) (ACUTE ONLY): 20 min   Charges:   PT Evaluation $PT Eval Low Complexity: 1 Low    PT General Charges $$ ACUTE PT VISIT: 1 Visit         Conni Slipper, PT, DPT Acute Rehabilitation Services Secure Chat Preferred Office: [phone]   Marylynn Pearson 03/17/2023, 10:10 AM

## 2023-03-17 NOTE — TOC Transition Note (Signed)
Transition of Care [facility]) - CM/SW Discharge Note   Patient Details  Name: Cesar [name] MRN: [id number] Date of Birth: [birth year]  Transition of Care [name]) CM/SW Contact:  Kermit Balo, RN Phone Number: 03/17/2023, 10:53 AM   Clinical Narrative:     Pt is discharging home with home health services through [facility]. Information on the AVS.  Pt has needed DME.  Pt has transportation home.  Final next level of care: Home w Home Health Services Barriers to Discharge: No Barriers Identified   Patient Goals and CMS Choice CMS Medicare.gov Compare Post Acute Care list provided to:: Patient Choice offered to / list presented to : Patient  Discharge Placement                         Discharge Plan and Services Additional resources added to the After Visit Summary for                            [facility] Arranged: PT, OT [facility] Agency: [facility] Health Care Date [facility] Agency Contacted: 03/17/23   Representative spoke with at [facility] Agency: Kandee Keen  Social Determinants of Health (SDOH) Interventions SDOH Screenings   Food Insecurity: No Food Insecurity (10/15/2022)  Housing: Low Risk  (10/15/2022)  Transportation Needs: No Transportation Needs (10/15/2022)  Utilities: Not At Risk (10/15/2022)  Alcohol Screen: Low Risk  (10/15/2022)  Depression (PHQ2-9): Low Risk  (12/09/2022)  Financial Resource Strain: Low Risk  (10/14/2021)  Physical Activity: Sufficiently Active (10/14/2021)  Social Connections: Moderately Isolated (10/14/2021)  Stress: No Stress Concern Present (10/14/2021)  Tobacco Use: Low Risk  (03/03/2023)     Readmission Risk Interventions     No data to display

## 2023-03-17 NOTE — Discharge Summary (Signed)
Physician Discharge Summary  Patient ID: Cesar [name] MRN: [id number] DOB/AGE: October 26, 1945 77 y.o.  Admit date: 03/16/2023 Discharge date: 03/17/2023  Admission Diagnoses: Cervical myelopathy C4-5 C5-6  Discharge Diagnoses: Cervical myelopathy C4-5 C5-6 Principal Problem:   Cervical myelopathy [facility])   Discharged Condition: good  Hospital Course: Patient was admitted to undergo anterior cervical decompression arthrodesis C4-5 C5-6.  He tolerated surgery well.  Consults: None  Significant Diagnostic Studies: None  Treatments: surgery: See op note  Discharge Exam: Blood pressure 104/74, pulse 71, temperature 98.3 F (36.8 C), temperature source Oral, resp. rate 17, height 6' (1.829 m), weight 92.5 kg, SpO2 99 %. Incision is clean and dry motor function is intact in the upper extremities.  Station and gait are intact.  Disposition: Discharge disposition: 01-Home or Self Care       Discharge Instructions     Call MD for:  redness, tenderness, or signs of infection (pain, swelling, redness, odor or green/yellow discharge around incision site)   Complete by: As directed    Call MD for:  severe uncontrolled pain   Complete by: As directed    Call MD for:  temperature >100.4   Complete by: As directed    Diet - low sodium heart healthy   Complete by: As directed    Incentive spirometry RT   Complete by: As directed    Increase activity slowly   Complete by: As directed       Allergies as of 03/17/2023       Reactions   Formaldehyde Itching, Rash   SOLN   Latex Itching   Sulfa Antibiotics Itching, Nausea And Vomiting, Rash   15 POWD SOLN   Butorphanol Other (See Comments)   Drowsiness   Celecoxib Rash   Flushing   Hydrocodone-acetaminophen Nausea And Vomiting   Oxycodone-acetaminophen Nausea And Vomiting   Povidone Iodine Rash   Metformin Hcl Other (See Comments)   *blurred vision, 11/06/2009   Simvastatin Other (See Comments)   Myalgias   Tamsulosin Other  (See Comments)   Dyspnea   Nifedipine Nausea Only   Nitroglycerin Rash   Nitroglycerin patch developed rash and blisters from adhesive and latex   Povidone-iodine Rash   Quaternium-15 Rash   Rofecoxib Nausea Only   Flushing   Tape Itching, Rash        Medication List     TAKE these medications    ALPRAZolam 0.5 MG tablet Commonly known as: XANAX TAKE 1 TABLET BY MOUTH 3 TIMES A DAY AS NEEDED FOR ANXIETY   Calcium-Vitamin D 600-200 MG-UNIT tablet Take 1 tablet by mouth daily.   FLUoxetine 40 MG capsule Commonly known as: PROZAC Take 1 capsule (40 mg total) by mouth daily.   HYDROcodone-acetaminophen 5-325 MG tablet Commonly known as: NORCO/VICODIN Take 1 tablet by mouth every 4 (four) hours as needed for moderate pain or severe pain.   hydrOXYzine 25 MG capsule Commonly known as: VISTARIL Take 25 mg by mouth 3 (three) times daily as needed for anxiety.   lovastatin 10 MG tablet Commonly known as: MEVACOR TAKE 1 TABLET BY MOUTH AT BEDTIME FOR CHOLESTEROL   metoprolol tartrate 25 MG tablet Commonly known as: LOPRESSOR TAKE 1/2 TABLET BY MOUTH TWICE DAILY   nitroGLYCERIN 0.4 MG SL tablet Commonly known as: NITROSTAT Place 1 tablet (0.4 mg total) under the tongue every 5 (five) minutes as needed for chest pain.   pantoprazole 40 MG tablet Commonly known as: PROTONIX Take 1 tablet (40 mg total) by mouth  2 (two) times daily. What changed: when to take this         Signed: Stefani Dama 03/17/2023, 9:02 AM

## 2023-03-17 NOTE — Evaluation (Signed)
Occupational Therapy Evaluation Patient Details Name: Cesar [name] MRN: [id number] DOB: March 28, 1946 Today's Date: 03/17/2023   History of Present Illness Pt is a 77 y/o male who presents s/p C4-C6 ACDF on 03/16/2023. PMH significant for PTSD, anxiety/depression, CAD, RSD, BPPV, prior back surgery 1998.   Clinical Impression   Patient is s/p ACDF C3-6 surgery resulting in functional limitations due to the deficits listed below (see OT problem list). Pt lives alone using a rollator from the [place] and drives. Pt has a friend( wife / spouse Cesar [name]) that come to help patient when needed. Pt states if I ask them they will take me places. They are the patients ride home but the patient was not 100% sure the names of these kind individuals. Pt states "its in my phone their number." The friends have elderly patients that live across from this patient and they met after he asked the male friend to cut his grass. Pt reports he has no one but these kind friends to help him.  Patient will benefit from skilled OT acutely to increase independence and safety with ADLS to allow discharge HHOT.       Recommendations for follow up therapy are one component of a multi-disciplinary discharge planning process, led by the attending physician.  Recommendations may be updated based on patient status, additional functional criteria and insurance authorization.   Assistance Recommended at Discharge PRN  Patient can return home with the following A little help with bathing/dressing/bathroom;Assist for transportation    Functional Status Assessment  Patient has had a recent decline in their functional status and demonstrates the ability to make significant improvements in function in a reasonable and predictable amount of time.  Equipment Recommendations  None recommended by OT    Recommendations for Other Services       Precautions / Restrictions Precautions Precautions: Fall;Back Precaution Booklet Issued:  Yes (comment) Precaution Comments: back handout provided and reviewed in detail for adls Required Braces or Orthoses:  (No brace needed order) Restrictions Weight Bearing Restrictions: No      Mobility Bed Mobility Overal bed mobility: Modified Independent             General bed mobility comments: exiting on the R side of the bed with cues for back precautions    Transfers Overall transfer level: Needs assistance Equipment used: Rollator (4 wheels) Transfers: Sit to/from Stand Sit to Stand: Min guard                  Balance Overall balance assessment: Needs assistance Sitting-balance support: Feet supported, Bilateral upper extremity supported Sitting balance-Leahy Scale: Fair     Standing balance support: Single extremity supported, During functional activity, Reliant on assistive device for balance Standing balance-Leahy Scale: Poor Standing balance comment: pt with LOB with reaching OOB                           ADL either performed or assessed with clinical judgement   ADL Overall ADL's : Needs assistance/impaired Eating/Feeding: Modified independent   Grooming: Oral care;Brushing hair;Modified independent;Sitting           Upper Body Dressing : Min guard;Sitting Upper Body Dressing Details (indicate cue type and reason): increased time to complete buttons Lower Body Dressing: Min guard;Sit to/from stand;With adaptive equipment;Adhering to back precautions Lower Body Dressing Details (indicate cue type and reason): don pants and shoes Toilet Transfer: Minimal assistance;Ambulation;Rollator (4 wheels);Regular Toilet  Functional mobility during ADLs: Minimal assistance;Rollator (4 wheels) General ADL Comments: pt with LOB reaching out side base of support anterior with MOD (A) to correct     Vision Baseline Vision/History: 1 Wears glasses Vision Assessment?: No apparent visual deficits     Perception     Praxis       Pertinent Vitals/Pain Pain Assessment Pain Assessment: Faces Faces Pain Scale: Hurts a little bit Pain Location: incision site Pain Descriptors / Indicators: Operative site guarding, Sore Pain Intervention(s): Monitored during session, Premedicated before session, Repositioned     Hand Dominance Right   Extremity/Trunk Assessment Upper Extremity Assessment Upper Extremity Assessment: RUE deficits/detail;LUE deficits/detail RUE Deficits / Details: reports seeing MD regarding shouler and prior recommendation for shoulder surgery. pt decrease fine motor RUE Sensation: decreased light touch RUE Coordination: decreased fine motor LUE Sensation: decreased light touch LUE Coordination: decreased fine motor   Lower Extremity Assessment Lower Extremity Assessment: Defer to PT evaluation;RLE deficits/detail RLE Deficits / Details: R knee pain with pop in recent weeks with ortho MD on board following care   Cervical / Trunk Assessment Cervical / Trunk Assessment: Neck Surgery   Communication Communication Communication: No difficulties   Cognition Arousal/Alertness: Awake/alert Behavior During Therapy: WFL for tasks assessed/performed Overall Cognitive Status: Impaired/Different from baseline Area of Impairment: Safety/judgement, Attention, Problem solving                   Current Attention Level: Sustained     Safety/Judgement: Decreased awareness of safety, Decreased awareness of deficits   Problem Solving: Slow processing, Requires verbal cues General Comments: pt repeating information a few times during session.     General Comments  incision dry at this time adn open to air    Exercises     Shoulder Instructions      Home Living Family/patient expects to be discharged to:: Private residence Living Arrangements: Alone Available Help at Discharge: Friend(s);Neighbor;Available PRN/intermittently Type of Home: House Home Access: Stairs to enter Entrance  Stairs-Number of Steps: 1 Entrance Stairs-Rails: None Home Layout: One level     Bathroom Shower/Tub: Chief Strategy Officer: Standard     Home Equipment: Rollator (4 wheels);Adaptive equipment Adaptive Equipment: Reacher Additional Comments: lives alone with neighbor friends that help provide him dinner/ lunch and morning coffee. they mow his lawn and do house work that might need to be done like fixing vents etc.      Prior Functioning/Environment Prior Level of Function : Needs assist;Driving             Mobility Comments: Rollator ADLs Comments: Friends/neighbors bring him coffee in the morning, meals. They help with household chores but pt reports independent with bADL's.        OT Problem List: Decreased strength;Decreased activity tolerance;Impaired balance (sitting and/or standing);Decreased safety awareness;Decreased knowledge of use of DME or AE;Decreased knowledge of precautions;Impaired UE functional use      OT Treatment/Interventions: Self-care/ADL training;Therapeutic exercise;Neuromuscular education;Energy conservation;Manual therapy;DME and/or AE instruction;Modalities;Therapeutic activities;Cognitive remediation/compensation;Patient/family education;Balance training    OT Goals(Current goals can be found in the care plan section) Acute Rehab OT Goals Patient Stated Goal: to be able to get back home OT Goal Formulation: With patient Time For Goal Achievement: 03/31/23 Potential to Achieve Goals: Good  OT Frequency: Min 2X/week    Co-evaluation              AM-PAC OT "6 Clicks" Daily Activity     Outcome Measure Help from another person eating meals?:  A Little Help from another person taking care of personal grooming?: A Little Help from another person toileting, which includes using toliet, bedpan, or urinal?: A Little Help from another person bathing (including washing, rinsing, drying)?: A Little Help from another person to put on  and taking off regular upper body clothing?: A Little Help from another person to put on and taking off regular lower body clothing?: A Little 6 Click Score: 18   End of Session Equipment Utilized During Treatment: Gait belt;Rollator (4 wheels) Nurse Communication: Mobility status;Precautions  Activity Tolerance: Patient tolerated treatment well Patient left: in chair;with call bell/phone within reach;Other (comment) (recommending PT see patient due to LOB with reaching)  OT Visit Diagnosis: Unsteadiness on feet (R26.81);Muscle weakness (generalized) (M62.81)                Time: 1610-9604 OT Time Calculation (min): 31 min Charges:  OT General Charges $OT Visit: 1 Visit OT Evaluation $OT Eval Moderate Complexity: 1 Mod OT Treatments $Self Care/Home Management : 8-22 mins   Brynn, OTR/L  Acute Rehabilitation Services Office: [phone] .   Mateo Flow 03/17/2023, 10:51 AM

## 2023-03-29 ENCOUNTER — Ambulatory Visit: Payer: Self-pay | Admitting: Licensed Clinical Social Worker

## 2023-03-29 NOTE — Patient Outreach (Signed)
  Care Coordination   Initial Visit Note   03/29/2023 Name: Cesar [name] MRN: [id number] DOB: Apr 28, 1946  Cesar [name] is a 77 y.o. year old male who sees Carmelia Roller, [name], DO for primary care. I spoke with  Cesar [name] by phone today.  What matters to the patients health and wellness today?  Patient uses a walker occasionally to help him walk    Goals Addressed             This Visit's Progress    patient uses a walker occasionally to help him walk       Interventions:  Spoke with client via phone about client needs Client said he had been using walker to help him walk. He is walking now for exercise. He said he enjoys walking.  He said he has not been having to use walker as much lately. Discussed medication procurement Discussed family support. He said he has reduced family support. His spouse died 3 years ago.  However, he said he has great support from his neighbors. His neighbors help him with meal support, and help him with transport needs Discussed program support with RN, LCSW, Pharmacist [facility] [place] [place]. Client is very pleased with support he receives from  [place] in [place], [place] For relaxation, client likes to play on line games Client said he also can cook some meals as needed.  He said he did not think he had any nursing needs at present Thanked client for phone call. Encouraged [name] to call LCSW as needed for SW support at [phone].  Client was appreciative of call from LCSW        SDOH assessments and interventions completed:  Yes  SDOH Interventions Today    Flowsheet Row Most Recent Value  SDOH Interventions   Physical Activity Interventions Other (Comments)  [walks for exercise. uses walker occasionally as needed]  Stress Interventions Other (Comment)  [has some stress occasionally in managing medical needs]        Care Coordination Interventions:  Yes, provided   Interventions Today     Flowsheet Row Most Recent Value  Chronic Disease   Chronic disease during today's visit Other  [spoke with client about client needs]  General Interventions   General Interventions Discussed/Reviewed General Interventions Discussed, [employer]  [discussed program support]  Exercise Interventions   Exercise Discussed/Reviewed Physical Activity  [client likes to walk for exercise]  Physical Activity Discussed/Reviewed Physical Activity Discussed  Education Interventions   Education Provided Provided Education  Provided Verbal Education On Community Resources  Mental Health Interventions   Mental Health Discussed/Reviewed Coping Strategies  [no mood issues mentioned by client. He said he has strong support from his neighbors]  Nutrition Interventions   Nutrition Discussed/Reviewed Nutrition Discussed  Pharmacy Interventions   Pharmacy Dicussed/Reviewed Pharmacy Topics Discussed  Safety Interventions   Safety Discussed/Reviewed Fall Risk        Follow up plan: Follow up call scheduled for 05/17/23 at 10:30 AM    Encounter Outcome:  Pt. Visit Completed   [name].Concetta Guion MSW, LCSW Licensed Visual merchandiser [facility] Care Management [phone]

## 2023-03-29 NOTE — Patient Instructions (Signed)
Visit Information  Thank you for taking time to visit with me today. Please don't hesitate to contact me if I can be of assistance to you.   Following are the goals we discussed today:   Goals Addressed             This Visit's Progress    patient uses a walker occasionally to help him walk       Interventions:  Spoke with client via phone about client needs Client said he had been using walker to help him walk. He is walking now for exercise. He said he enjoys walking.  He said he has not been having to use walker as much lately. Discussed medication procurement Discussed family support. He said he has reduced family support. His spouse died 3 years ago.  However, he said he has great support from his neighbors. His neighbors help him with meal support, and help him with transport needs Discussed program support with RN, LCSW, Pharmacist [facility]ed [facility] [place] [place]. Client is very pleased with support he receives from  [place] in [place], [place] For relaxation, client likes to play on line games Client said he also can cook some meals as needed.  He said he did not think he had any nursing needs at present Thanked client for phone call. Encouraged [name] to call LCSW as needed for SW support at [phone].  Client was appreciative of call from LCSW        Our next appointment is by telephone on 05/17/23 at 10:30 AM   Please call the care guide team at [phone] if you need to cancel or reschedule your appointment.   If you are experiencing a Mental Health or Behavioral Health Crisis or need someone to talk to, please go to [facility] [address], [place] [phone])   The patient verbalized understanding of instructions, educational materials, and care plan provided today and DECLINED offer to receive copy of patient instructions, educational materials, and care plan.   The patient has been provided with  contact information for the care management team and has been advised to call with any health related questions or concerns.   [name].Amarri Satterly MSW, LCSW Licensed Visual merchandiser [facility] Care Management [phone]

## 2023-04-03 ENCOUNTER — Other Ambulatory Visit: Payer: Self-pay | Admitting: Family Medicine

## 2023-04-14 ENCOUNTER — Ambulatory Visit (INDEPENDENT_AMBULATORY_CARE_PROVIDER_SITE_OTHER): Payer: Medicare Other | Admitting: Family Medicine

## 2023-04-14 ENCOUNTER — Encounter: Payer: Self-pay | Admitting: Family Medicine

## 2023-04-14 VITALS — BP 131/81 | HR 66 | Temp 97.0°F | Ht 72.0 in | Wt 207.5 lb

## 2023-04-14 DIAGNOSIS — F411 Generalized anxiety disorder: Secondary | ICD-10-CM

## 2023-04-14 DIAGNOSIS — I25119 Atherosclerotic heart disease of native coronary artery with unspecified angina pectoris: Secondary | ICD-10-CM

## 2023-04-14 DIAGNOSIS — E7800 Pure hypercholesterolemia, unspecified: Secondary | ICD-10-CM

## 2023-04-14 DIAGNOSIS — F4310 Post-traumatic stress disorder, unspecified: Secondary | ICD-10-CM

## 2023-04-14 NOTE — Progress Notes (Signed)
Chief Complaint  Patient presents with   Follow-up    6 month    Subjective Cesar [name] presents for f/u anxiety/depression.  Pt is currently being treated with Prozac 40 mg/d, hydralazine 25 mg TID prn, Xanax 0.5 mg TID prn.  Reports doing well since treatment. No thoughts of harming self or others. No self-medication with alcohol, prescription drugs or illicit drugs. Pt is not following with a counselor/psychologist  Hyperlipidemia Patient presents for hyperlipidemia follow up. Currently being treated with lovastatin 10 mg/d and compliance with treatment thus far has been good. He denies myalgias. He is adhering to a healthy diet. Exercise: walking The patient is known to have coexisting coronary artery disease. Taking metoprolol tartrate 12.5 mg bid.  No Cp or SOB.   Past Medical History:  Diagnosis Date   Allergy    Anxiety    Arthritis    Back pain, lumbosacral    Back pain, thoracic    Coronary artery disease    normal RCA, LCX, 50% prox/mid LAD with "normal" FFR 06/09/17 [facility], medical therapy   Depression    Eczema    GERD (gastroesophageal reflux disease)    Prostatitis    PTSD (post-traumatic stress disorder)    RSD (reflex sympathetic dystrophy)    on the right side   Vertigo    Allergies as of 04/14/2023       Reactions   Formaldehyde Itching, Rash   SOLN   Latex Itching   Sulfa Antibiotics Itching, Nausea And Vomiting, Rash   15 POWD SOLN   Butorphanol Other (See Comments)   Drowsiness   Celecoxib Rash   Flushing   Hydrocodone-acetaminophen Nausea And Vomiting   Oxycodone-acetaminophen Nausea And Vomiting   Povidone Iodine Rash   Metformin Hcl Other (See Comments)   *blurred vision, 11/06/2009   Simvastatin Other (See Comments)   Myalgias   Tamsulosin Other (See Comments)   Dyspnea   Nifedipine Nausea Only   Nitroglycerin Rash   Nitroglycerin patch developed rash and blisters from adhesive and latex   Povidone-iodine Rash   Quaternium-15  Rash   Rofecoxib Nausea Only   Flushing   Tape Itching, Rash        Medication List        Accurate as of April 14, 2023  2:56 PM. If you have any questions, ask your nurse or doctor.          STOP taking these medications    midodrine 5 MG tablet Commonly known as: PROAMATINE Stopped by: [name] Devon Pretty       TAKE these medications    ALPRAZolam 0.5 MG tablet Commonly known as: XANAX TAKE 1 TABLET BY MOUTH 3 TIMES A DAY AS NEEDED FOR ANXIETY   Calcium-Vitamin D 600-200 MG-UNIT tablet Take 1 tablet by mouth daily.   FLUoxetine 40 MG capsule Commonly known as: PROZAC Take 1 capsule (40 mg total) by mouth daily.   HYDROcodone-acetaminophen 5-325 MG tablet Commonly known as: NORCO/VICODIN Take 1 tablet by mouth every 4 (four) hours as needed for moderate pain or severe pain.   hydrOXYzine 25 MG capsule Commonly known as: VISTARIL Take 25 mg by mouth 3 (three) times daily as needed for anxiety.   lovastatin 10 MG tablet Commonly known as: MEVACOR TAKE 1 TABLET BY MOUTH AT BEDTIME FOR CHOLESTEROL   metoprolol tartrate 25 MG tablet Commonly known as: LOPRESSOR TAKE 1/2 TABLET BY MOUTH TWICE DAILY   nitroGLYCERIN 0.4 MG SL tablet Commonly known as: NITROSTAT Place  1 tablet (0.4 mg total) under the tongue every 5 (five) minutes as needed for chest pain.   pantoprazole 40 MG tablet Commonly known as: PROTONIX Take 1 tablet (40 mg total) by mouth 2 (two) times daily. What changed: when to take this        Exam BP 131/81 (BP Location: Left Arm, Patient Position: Sitting, Cuff Size: Normal)   Pulse 66   Temp (!) 97 F (36.1 C) (Oral)   Ht 6' (1.829 m)   Wt 207 lb 8 oz (94.1 kg)   SpO2 98%   BMI 28.14 kg/m  General:  well developed, well nourished, in no apparent distress Lungs:  No respiratory distress Psych: well oriented with normal range of affect and age-appropriate judgement/insight, alert and oriented x4.  Assessment and Plan  GAD  (generalized anxiety disorder)  PTSD (post-traumatic stress disorder)  Pure hypercholesterolemia  Coronary artery disease involving native coronary artery of native heart with angina pectoris (HCC)  1/2. Chronic, stable. Cont hydroxyzine 25 mg TID prn, Xanax prn, Prozac 40 mg/d.  3. Chronic, stable. Cont lovastatin. Getting labs at [facility]. Counseled on diet/exercise.  4. Cont metoprolol 12.5 mg bid.  F/u in 6 mo. The patient voiced understanding and agreement to the plan.  [name] [place], DO 04/14/23 2:56 PM

## 2023-04-14 NOTE — Patient Instructions (Signed)
Keep the diet clean and stay active.  Let us know if you need anything. 

## 2023-04-27 ENCOUNTER — Other Ambulatory Visit: Payer: Self-pay | Admitting: Gastroenterology

## 2023-05-11 ENCOUNTER — Other Ambulatory Visit: Payer: Self-pay | Admitting: Family Medicine

## 2023-05-17 ENCOUNTER — Ambulatory Visit: Payer: Self-pay | Admitting: Licensed Clinical Social Worker

## 2023-05-17 NOTE — Patient Outreach (Signed)
  Care Coordination   Follow Up Visit Note   05/17/2023 Name: Cesar [name] MRN: [id number] DOB: 1946-02-15  Cesar [name] is a 77 y.o. year old male who sees Carmelia Roller, [name], DO for primary care. I spoke with  Cesar [name] by phone today.  What matters to the patients health and wellness today?  Patient uses a walker occasionally to help him walk    Goals Addressed             This Visit's Progress    patient uses a walker occasionally to help him walk       Interventions:  Spoke with client via phone today about his status and his current needs. Client said he had been using walker to help him walk. He is walking now for exercise. He said he enjoys walking.  He said he has not been having to use walker as much lately.  Discussed medication procurement. He said he had his prescribed medications and is taking medications as prescribed Client said he has great support from his neighbors. His neighbors help him with meal support, and help him with transport needs if needed. Discussed program support with RN, LCSW, Pharmacist [facility] [place] [place]. Client said he goes to [place] in [place] , [place] for scheduled appointments. Discussed mood issues of client. Client said he did not think he had any mood issues at present. He is taking medications as prescribed. He has social support with neighbors. He walks for exercise Regarding socializing with others, client said he enjoys talking and visiting with his friends who are [employer] client for phone call. Encouraged [name] to call LCSW as needed for SW support at [phone].  Client was appreciative of call from LCSW        SDOH assessments and interventions completed:  Yes  SDOH Interventions Today    Flowsheet Row Most Recent Value  SDOH Interventions   Depression Interventions/Treatment  Medication, Counseling  Physical Activity Interventions Other (Comments)  [enjoys walking for  exercise]  Stress Interventions Provide Counseling  [client has stress in monitoring medical needs. He has diagnoses of PTSD and GAD. He takes medications as prescribed]        Care Coordination Interventions:  Yes, provided   Interventions Today    Flowsheet Row Most Recent Value  Chronic Disease   Chronic disease during today's visit Other  [spoke with client about client needs]  General Interventions   General Interventions Discussed/Reviewed General Interventions Discussed, Community Resources  Education Interventions   Education Provided Provided Education  Provided Verbal Education On [employer]  Mental Health Interventions   Mental Health Discussed/Reviewed Coping Strategies  [client is taking medications as prescribed. He said he thought he was doing well with his mood He was in good mood.]  Nutrition Interventions   Nutrition Discussed/Reviewed Nutrition Discussed  [neighbors of client help provide food for client]  Pharmacy Interventions   Pharmacy Dicussed/Reviewed Pharmacy Topics Discussed  Safety Interventions   Safety Discussed/Reviewed Fall Risk        Follow up plan: Follow up call scheduled for 07/06/23 at 10:00 AM    Encounter Outcome:  Patient Visit Completed   [name].Tamer Baughman MSW, LCSW Licensed Visual merchandiser [facility] Care Management [phone]

## 2023-05-17 NOTE — Patient Instructions (Signed)
Visit Information  Thank you for taking time to visit with me today. Please don't hesitate to contact me if I can be of assistance to you.   Following are the goals we discussed today:   Goals Addressed             This Visit's Progress    patient uses a walker occasionally to help him walk       Interventions:  Spoke with client via phone today about his status and his current needs. Client said he had been using walker to help him walk. He is walking now for exercise. He said he enjoys walking.  He said he has not been having to use walker as much lately.  Discussed medication procurement. He said he had his prescribed medications and is taking medications as prescribed Client said he has great support from his neighbors. His neighbors help him with meal support, and help him with transport needs if needed. Discussed program support with RN, LCSW, Pharmacist [facility] [place] [place]. Client said he goes to [place] in [place] , [place] for scheduled appointments. Discussed mood issues of client. Client said he did not think he had any mood issues at present. He is taking medications as prescribed. He has social support with neighbors. He walks for exercise Regarding socializing with others, client said he enjoys talking and visiting with his friends who are [employer] client for phone call. Encouraged [name] to call LCSW as needed for SW support at [phone].  Client was appreciative of call from LCSW        Our next appointment is by telephone on 07/06/23 at 10:00 AM   Please call the care guide team at [phone] if you need to cancel or reschedule your appointment.   If you are experiencing a Mental Health or Behavioral Health Crisis or need someone to talk to, please go to [facility] [address], [place] [phone])   The patient verbalized understanding of instructions, educational materials, and  care plan provided today and DECLINED offer to receive copy of patient instructions, educational materials, and care plan.   The patient has been provided with contact information for the care management team and has been advised to call with any health related questions or concerns.   [name].Kesley Mullens MSW, LCSW Licensed Visual merchandiser [facility] Care Management [phone]

## 2023-06-05 ENCOUNTER — Other Ambulatory Visit: Payer: Self-pay | Admitting: Family Medicine

## 2023-06-30 ENCOUNTER — Ambulatory Visit (INDEPENDENT_AMBULATORY_CARE_PROVIDER_SITE_OTHER): Payer: Medicare Other | Admitting: Family Medicine

## 2023-06-30 ENCOUNTER — Encounter: Payer: Self-pay | Admitting: Family Medicine

## 2023-06-30 VITALS — BP 122/70 | HR 65 | Temp 98.2°F | Ht 72.0 in | Wt 205.5 lb

## 2023-06-30 DIAGNOSIS — R519 Headache, unspecified: Secondary | ICD-10-CM | POA: Diagnosis not present

## 2023-06-30 DIAGNOSIS — Z23 Encounter for immunization: Secondary | ICD-10-CM

## 2023-06-30 MED ORDER — DIVALPROEX SODIUM ER 500 MG PO TB24: 500.0000 mg | ORAL_TABLET | Freq: Every day | ORAL | 1 refills | Status: DC

## 2023-06-30 NOTE — Patient Instructions (Signed)
Heat (pad or rice pillow in microwave) over affected area, 10-15 minutes twice daily.   Ice/cold pack over area for 10-15 min twice daily.  OK to take Tylenol 1000 mg (2 extra strength tabs) or 975 mg (3 regular strength tabs) every 6 hours as needed.  Let us know if you need anything.

## 2023-06-30 NOTE — Progress Notes (Signed)
Chief Complaint  Patient presents with   headaches    Cesar [name] is a 77 y.o. male here for evaluation of bilateral headache.  Hx of headaches.  Treatment: Tylenol 500 mg prn is not helping Aura: none Palliation: none Provocation: none Associated symptoms: blurred vision when severe Denies: Gum chewing, jaw pain, injury, nausea, vomiting, sonophobia, photophobia, diplopia, vertigo, tinnitus, ataxia, lacrimation Currently with headache? mild Failed therapies: did well with Depakote historically  Past Medical History:  Diagnosis Date   Allergy    Anxiety    Arthritis    Back pain, lumbosacral    Back pain, thoracic    Coronary artery disease    normal RCA, LCX, 50% prox/mid LAD with "normal" FFR 06/09/17 [facility], medical therapy   Depression    Eczema    GERD (gastroesophageal reflux disease)    Prostatitis    PTSD (post-traumatic stress disorder)    RSD (reflex sympathetic dystrophy)    on the right side   Vertigo     BP 122/70 (BP Location: Left Arm, Patient Position: Sitting, Cuff Size: Normal)   Pulse 65   Temp 98.2 F (36.8 C) (Oral)   Ht 6' (1.829 m)   Wt 205 lb 8 oz (93.2 kg)   SpO2 99%   BMI 27.87 kg/m  General: awake, alert, appearing stated age Eyes: PERRLA, EOMi Mouth: MMM Lungs: no accessory muscle use Neuro: CN 2-12 intact, no cerebellar signs, DTR's equal and symmetry, no clonus MSK: 5/5 strength throughout, normal gait, no TTP over posterior cervical triangle or paraspinal cervical musculature Psych: Age appropriate judgment and insight, mood and affect normal  Chronic daily headache - Plan: divalproex (DEPAKOTE ER) 500 MG 24 hr tablet  No concerning neurologic signs.  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  He is working with physical therapy.  Will restart Depakote 500 mg daily. Follow up in 1 mo. The patient voiced understanding and agreement to the plan.  [name] [place], [place] 12:56 PM 06/30/23

## 2023-07-06 ENCOUNTER — Ambulatory Visit: Payer: Self-pay | Admitting: Licensed Clinical Social Worker

## 2023-07-06 NOTE — Patient Outreach (Signed)
  Care Coordination   Follow Up Visit Note   07/06/2023 Name: Cesar [name] MRN: [id number] DOB: 1946/03/22  Cesar [name] is a 77 y.o. year old male who sees Carmelia Roller, [name], DO for primary care. I spoke with  Cesar [name] by phone today.  What matters to the patients health and wellness today? Patient uses a walker occasionally to help him walk    Goals Addressed             This Visit's Progress    patient uses a walker occasionally to help him walk       Interventions:  Spoke with client via phone today about his status and his current needs.  Client said he was doing well. Client said he had been using walker to help him walk. He is walking now for exercise. He said he enjoys walking.  He said he has not been having to use walker as much lately.  Discussed medication procurement. He said he had his prescribed medications and is taking medications as prescribed Client said he has great support from his neighbors. His neighbors help him with meal support, and help him with transport needs if needed. He talks with his neighbors regularly Discussed program support with RN, LCSW, Pharmacist D[facility]ed [facility] with [facility] in [place], [place]. Client said he goes to [place] in [place] , [place] for scheduled appointments.  Discussed mood issues of client. Client said he did not think he had any mood issues at present. He is taking medications as prescribed. He said he has been talking with a counselor through the [place] in [place], [place] Client said he has appointment tomorrow with surgeon who had done his back surgery. He plans to talk with surgeon about feelings of numbness and coldness occasionally in his hands Used Active Listening techniques Discussed sleeping issues of client. Thanked client for phone call. Encouraged [name] to call LCSW as needed for SW support at [phone].  Client  was appreciative of call from LCSW        SDOH assessments and interventions  completed:  Yes  SDOH Interventions Today    Flowsheet Row Most Recent Value  SDOH Interventions   Depression Interventions/Treatment  Medication, Counseling  Physical Activity Interventions Other (Comments)  [likes to walk for exercise]  Stress Interventions Provide Counseling        Care Coordination Interventions:  Yes, provided   Interventions Today    Flowsheet Row Most Recent Value  Chronic Disease   Chronic disease during today's visit Other  [spoke with client about client needs]  General Interventions   General Interventions Discussed/Reviewed General Interventions Discussed, Community Resources  Education Interventions   Education Provided Provided Education  Provided Verbal Education On [employer]  Mental Health Interventions   Mental Health Discussed/Reviewed Coping Strategies  [no mood issues noted during call]  Pharmacy Interventions   Pharmacy Dicussed/Reviewed Pharmacy Topics Discussed       Follow up plan: Follow up call scheduled for 08/24/23 at 10:00 AM    Encounter Outcome:  Patient Visit Completed   [name].Tammee Thielke MSW, LCSW Licensed Visual merchandiser [facility] Care Management [phone]

## 2023-07-06 NOTE — Patient Instructions (Signed)
Visit Information  Thank you for taking time to visit with me today. Please don't hesitate to contact me if I can be of assistance to you.   Following are the goals we discussed today:   Goals Addressed             This Visit's Progress    patient uses a walker occasionally to help him walk       Interventions:  Spoke with client via phone today about his status and his current needs.  Client said he was doing well. Client said he had been using walker to help him walk. He is walking now for exercise. He said he enjoys walking.  He said he has not been having to use walker as much lately.  Discussed medication procurement. He said he had his prescribed medications and is taking medications as prescribed Client said he has great support from his neighbors. His neighbors help him with meal support, and help him with transport needs if needed. He talks with his neighbors regularly Discussed program support with RN, LCSW, Pharmacist Discussed [facility] support with [facility] in [place], [place]. Client said he goes to [place] in [place] , [place] for scheduled appointments.  Discussed mood issues of client. Client said he did not think he had any mood issues at present. He is taking medications as prescribed. He said he has been talking with a counselor through the [place] in [place], [place] Client said he has appointment tomorrow with surgeon who had done his back surgery. He plans to talk with surgeon about feelings of numbness and coldness occasionally in his hands Used Active Listening techniques Discussed sleeping issues of client. Thanked client for phone call. Encouraged [name] to call LCSW as needed for SW support at [phone].  Client  was appreciative of call from LCSW        Our next appointment is by telephone on 08/24/23 a 10:00 AM   Please call the care guide team at [phone] if you need to cancel or reschedule your appointment.   If you are experiencing a Mental Health or  Behavioral Health Crisis or need someone to talk to, please go to [facility] [address] [place] [phone])   The patient verbalized understanding of instructions, educational materials, and care plan provided today and DECLINED offer to receive copy of patient instructions, educational materials, and care plan.   The patient has been provided with contact information for the care management team and has been advised to call with any health related questions or concerns.    [name].Glendola Friedhoff MSW, LCSW Licensed Visual merchandiser [facility] Care Management [phone]

## 2023-07-30 ENCOUNTER — Ambulatory Visit (INDEPENDENT_AMBULATORY_CARE_PROVIDER_SITE_OTHER): Payer: Medicare Other | Admitting: Family Medicine

## 2023-07-30 ENCOUNTER — Encounter: Payer: Self-pay | Admitting: Family Medicine

## 2023-07-30 VITALS — BP 126/76 | HR 69 | Temp 98.0°F | Resp 16 | Ht 73.0 in | Wt 206.2 lb

## 2023-07-30 DIAGNOSIS — I951 Orthostatic hypotension: Secondary | ICD-10-CM | POA: Diagnosis not present

## 2023-07-30 DIAGNOSIS — F4310 Post-traumatic stress disorder, unspecified: Secondary | ICD-10-CM

## 2023-07-30 DIAGNOSIS — F411 Generalized anxiety disorder: Secondary | ICD-10-CM | POA: Diagnosis not present

## 2023-07-30 DIAGNOSIS — R519 Headache, unspecified: Secondary | ICD-10-CM | POA: Diagnosis not present

## 2023-07-30 MED ORDER — ALPRAZOLAM 0.5 MG PO TABS
0.5000 mg | ORAL_TABLET | Freq: Three times a day (TID) | ORAL | 1 refills | Status: AC | PRN
Start: 2023-07-30 — End: ?

## 2023-07-30 MED ORDER — MIDODRINE HCL 5 MG PO TABS
5.0000 mg | ORAL_TABLET | Freq: Two times a day (BID) | ORAL | 3 refills | Status: AC
Start: 2023-07-30 — End: ?

## 2023-07-30 MED ORDER — DIVALPROEX SODIUM ER 500 MG PO TB24
500.0000 mg | ORAL_TABLET | Freq: Every day | ORAL | 3 refills | Status: AC
Start: 2023-07-30 — End: ?

## 2023-07-30 NOTE — Progress Notes (Signed)
Chief Complaint  Patient presents with   Follow-up    Follow up    Cesar [name] is a 77 y.o. male here for evaluation of [facility].  Treatment: Depakote  Aura: N/A Reports 75% improvement.  Palliation: Tylenol Provocation: none Associated symptoms: blurred vision when severe Denies: Gum chewing, jaw pain, injury, nausea, vomiting, sonophobia, photophobia, diplopia, vertigo Currently with headache? No Failed therapies: nothing chronic  BP 126/76 (BP Location: Left Arm, Patient Position: Sitting, Cuff Size: Normal)   Pulse 69   Temp 98 F (36.7 C) (Oral)   Resp 16   Ht 6\' 1"  (1.854 m)   Wt 206 lb 3.2 oz (93.5 kg)   SpO2 97%   BMI 27.20 kg/m  General: awake, alert, appearing stated age Eyes: PERRLA, EOMi Lungs: no accessory muscle use Neuro: CN 2-12 intact, no cerebellar signs, DTR's equal and symmetry, no clonus MSK: 5/5 strength throughout, normal gait, no TTP over posterior cervical triangle or paraspinal cervical musculature Psych: Age appropriate judgment and insight, mood and affect normal  Chronic daily headache - Plan: divalproex (DEPAKOTE ER) 500 MG 24 hr tablet  GAD (generalized anxiety disorder) - Plan: ALPRAZolam (XANAX) 0.5 MG tablet  PTSD (post-traumatic stress disorder) - Plan: ALPRAZolam (XANAX) 0.5 MG tablet  Orthostatic hypotension - Plan: midodrine (PROAMATINE) 5 MG tablet  Chronic, stable.  Continue Depakote 500 mg daily.  If symptoms do worsen, could consider daily magnesium 200 to 40 mg daily.  He will cancel 74-month appointment and I will see him in 6 months or as needed.  Continue Tylenol for abortive therapy. The patient voiced understanding and agreement to the plan.  [name] [place], [place] 2:58 PM 07/30/23

## 2023-07-30 NOTE — Patient Instructions (Signed)
Consider adding magnesium 200-400 mg daily to add to the Depakote if the headaches start to worsen.   Let us know if you need anything.

## 2023-08-17 ENCOUNTER — Other Ambulatory Visit: Payer: Self-pay | Admitting: Family Medicine

## 2023-08-23 ENCOUNTER — Ambulatory Visit: Payer: Self-pay | Admitting: Licensed Clinical Social Worker

## 2023-08-23 NOTE — Patient Instructions (Signed)
Visit Information  Thank you for taking time to visit with me today. Please don't hesitate to contact me if I can be of assistance to you.   Following are the goals we discussed today:   Goals Addressed             This Visit's Progress    patient uses a walker occasionally to help him walk       Interventions:  Spoke with client via phone today about his status and his current needs.  Client said he was doing well. He is attending appointments as scheduled.  Client said he had been using walker to help him walk. He is walking now for exercise. He said he enjoys walking.  He said he has not been having to use walker as much lately.  Discussed medication procurement. He said he had his prescribed medications and is taking medications as prescribed Client said he has great support from his neighbors. His neighbors help him with meal support, and help him with transport needs if needed. He talks with his neighbors regularly. He sometimes will share a meal with his neighbors Discussed program support with RN, LCSW, Pharmacist Discussed [facility] support with [facility] in [place], [place]. Client said he goes to [place] in [place] , [place] for scheduled appointments.  Discussed mood issues of client. Client said he did not think he had any mood issues at present. He is taking medications as prescribed. He said he has been talking with a Veterinary surgeon through the [place] in [place], [place]. He said that speaking with counselor at [facility] in [place], [place] has been helpful to him. He said in January and February of 2025 he will participate in group therapy sessions through [place] in [place], [place] Used Active Listening techniques Client said he has eye appointment scheduled for September 13, 2023 Client said he drives as needed, cleans home as needed and cooks as needed [name] client for phone call. Encouraged Cesar [name] to call LCSW as needed for SW support at [phone].  Client was appreciative of call from LCSW         Our next appointment is by telephone on 10/18/23 at 4:00 PM   Please call the care guide team at [phone] if you need to cancel or reschedule your appointment.   If you are experiencing a Mental Health or Behavioral Health Crisis or need someone to talk to, please go to [facility] [address] [place] [phone])   The patient verbalized understanding of instructions, educational materials, and care plan provided today and DECLINED offer to receive copy of patient instructions, educational materials, and care plan.   The patient has been provided with contact information for the care management team and has been advised to call with any health related questions or concerns.   [name].[name] MSW, LCSW Licensed Visual merchandiser [facility] Care Management [phone]

## 2023-08-23 NOTE — Patient Outreach (Signed)
Care Coordination   Follow Up Visit Note   08/23/2023 Name: Cesar [name] MRN: [id number] DOB: Aug 03, 1946  Cesar [name] is a 77 y.o. year old male who sees Carmelia Roller, [name], DO for primary care. I spoke with  Corinne Ports by phone today.  What matters to the patients health and wellness today?  Patient uses a walker occasionally to help him walk    Goals Addressed             This Visit's Progress    patient uses a walker occasionally to help him walk       Interventions:  Spoke with client via phone today about his status and his current needs.  Client said he was doing well. He is attending appointments as scheduled.  Client said he had been using walker to help him walk. He is walking now for exercise. He said he enjoys walking.  He said he has not been having to use walker as much lately.  Discussed medication procurement. He said he had his prescribed medications and is taking medications as prescribed Client said he has great support from his neighbors. His neighbors help him with meal support, and help him with transport needs if needed. He talks with his neighbors regularly. He sometimes will share a meal with his neighbors Discussed program support with RN, LCSW, Pharmacist Discussed client support with [facility] in [place], [place]. Client said he goes to [place] in [place] , [place] for scheduled appointments.  Discussed mood issues of client. Client said he did not think he had any mood issues at present. He is taking medications as prescribed. He said he has been talking with a Veterinary surgeon through the [place] in [place], [place]. He said that speaking with counselor at [facility] in [place], [place] has been helpful to him. He said in January and February of 2025 he will participate in group therapy sessions through [place] in [place], [place] Used Active Listening techniques Client said he has eye appointment scheduled for September 13, 2023 Client said he drives as needed, cleans home as  needed and cooks as needed [name] client for phone call. Encouraged [name] to call LCSW as needed for SW support at [phone].  Client was appreciative of call from LCSW        SDOH assessments and interventions completed:  Yes  SDOH Interventions Today    Flowsheet Row Most Recent Value  SDOH Interventions   Depression Interventions/Treatment  Medication, Currently on Treatment  Physical Activity Interventions Other (Comments)  [some mobility challenges]  Stress Interventions Other (Comment)  [has stress in managing medical needs]        Care Coordination Interventions:  Yes, provided   Interventions Today    Flowsheet Row Most Recent Value  Chronic Disease   Chronic disease during today's visit Other  [spoke with client about client needs]  General Interventions   General Interventions Discussed/Reviewed General Interventions Discussed, Community Resources  Education Interventions   Education Provided Provided Education  Provided Engineer, [employer] On [employer]  Mental Health Interventions   Mental Health Discussed/Reviewed Coping Strategies  [takes medications as prescribed. [facility]]  Nutrition Interventions   Nutrition Discussed/Reviewed Nutrition Discussed  Pharmacy Interventions   Pharmacy Dicussed/Reviewed Pharmacy Topics Discussed        Follow up plan: Follow up call scheduled for 10/18/23 at 4:00 PM     Encounter Outcome:  Patient Visit Completed   [name].Venancio Chenier MSW, LCSW Licensed Clinical Social Worker [facility] Care Management  [phone]   

## 2023-08-24 ENCOUNTER — Encounter: Payer: Self-pay | Admitting: Licensed Clinical Social Worker

## 2023-09-03 ENCOUNTER — Other Ambulatory Visit: Payer: Self-pay | Admitting: Family Medicine

## 2023-10-15 ENCOUNTER — Ambulatory Visit: Payer: Medicare Other | Admitting: Family Medicine

## 2023-10-18 ENCOUNTER — Ambulatory Visit: Payer: Self-pay | Admitting: Licensed Clinical Social Worker

## 2023-10-18 ENCOUNTER — Ambulatory Visit: Payer: Medicare Other | Admitting: Family Medicine

## 2023-10-18 NOTE — Patient Outreach (Signed)
 Care Coordination   Follow Up Visit Note   10/18/2023 Name: Cesar [name] MRN: [id number] DOB: 01-Jun-1946  Cesar [name] is a 78 y.o. year old male who sees Gwenette Lennox, Shellie Dials, DO for primary care. I spoke with  Cordelia Dessert by phone today.  What matters to the patients health and wellness today?  Patient uses a walker occasionally to help him walk    Goals Addressed             This Visit's Progress    patient uses a walker occasionally to help him walk       Interventions:  Spoke with client via phone today about his status and his current needs.  Client said he was doing well. He is attending appointments as scheduled.  Discussed ambulation of client.  Client said he goes walking almost every day. He is not having to use a device to help him walk.   Discussed medication procurement. He said he had his prescribed medications and is taking medications as prescribed Client said he has great support from his neighbors. His neighbors help him with meal support, and help him with transport needs if needed. He talks with his neighbors regularly. He sometimes will share a meal with his neighbors Client said he also socializes occasionally with other Veterans and he enjoys socializing with Veterans Discussed program support with RN, LCSW, Pharmacist Discussed [facility] support with [facility] in [place], [place]. Client said he goes to [place] in [place] , [place] for scheduled appointments.  Discussed mood issues of client. Client said he did not think he had any mood issues at present. He is taking medications as prescribed. He said he has been talking with a Veterinary surgeon through the [place] in [place], [place]. He said that speaking with counselor at [facility] in [place], [place] has been helpful to him. He said in January 2025 he participated in group therapy sessions.  He also has scheduled to participate in February of 2025 in group therapy sessions Used Active Listening techniques to allow client to  share client feelings.   Client said he is sleeping well . He said he can drive his vehicle as needed to local appointments and to complete errands Client said he does get dizzy occasionally Thanked client for phone call. Encouraged Gwinda Leopard to call LCSW as needed for SW support at [phone].  Client was appreciative of call from LCSW today          SDOH assessments and interventions completed:  Yes  SDOH Interventions Today    Flowsheet Row Most Recent Value  SDOH Interventions   Depression Interventions/Treatment  Medication, Currently on Treatment  Physical Activity Interventions Other (Comments)  [client likes to walk for exercise]  Stress Interventions Other (Comment)  [client has some stress in managing medical needs]        Care Coordination Interventions:  Yes, provided    Interventions Today    Flowsheet Row Most Recent Value  Chronic Disease   Chronic disease during today's visit Other  [spoke with client about client needs]  General Interventions   General Interventions Discussed/Reviewed General Interventions Discussed, Community Resources  Education Interventions   Education Provided Provided Education  Provided Engineer, [employer] On [employer]  Mental Health Interventions   Mental Health Discussed/Reviewed Coping Strategies  [client feels that his mood is stable. he socializes  with Veterans occasionally. He socializes with his neighbors. He has counseling support as neeed. Takes meds as prescribed]  Nutrition Interventions   Nutrition Discussed/Reviewed  Nutrition Discussed  Pharmacy Interventions   Pharmacy Dicussed/Reviewed Pharmacy Topics Discussed  Safety Interventions   Safety Discussed/Reviewed Fall Risk       Follow up plan: Follow up call scheduled for 12/28/23 at 10:00 AM    Encounter Outcome:  Patient Visit Completed    Alexandria Angel  MSW, LCSW [facility] Licensed Clinical Social  Worker Direct Dial:  [phone] Fax:  [phone] Website:  [email or web address]

## 2023-10-18 NOTE — Patient Instructions (Signed)
 Visit Information  Thank you for taking time to visit with me today. Please don't hesitate to contact me if I can be of assistance to you.   Following are the goals we discussed today:   Goals Addressed             This Visit's Progress    patient uses a walker occasionally to help him walk       Interventions:  Spoke with client via phone today about his status and his current needs.  Client said he was doing well. He is attending appointments as scheduled.  Discussed ambulation of client.  Client said he goes walking almost every day. He is not having to use a device to help him walk.   Discussed medication procurement. He said he had his prescribed medications and is taking medications as prescribed Client said he has great support from his neighbors. His neighbors help him with meal support, and help him with transport needs if needed. He talks with his neighbors regularly. He sometimes will share a meal with his neighbors Client said he also socializes occasionally with other Veterans and he enjoys socializing with Veterans Discussed program support with RN, LCSW, Pharmacist [facility] [place] [place]. Client said he goes to [facility] in [place] , [place] for scheduled appointments.  Discussed mood issues of client. Client said he did not think he had any mood issues at present. He is taking medications as prescribed. He said he has been talking with a Veterinary surgeon through the [facility] in [place], [place]. He said that speaking with counselor at [facility] in [place], [place] has been helpful to him. He said in January 2025 he participated in group therapy sessions.  He also has scheduled to participate in February of 2025 in group therapy sessions Used Active Listening techniques to allow client to share client feelings.   Client said he is sleeping well . He said he can drive his vehicle as needed to local appointments and to complete errands Client said he does get dizzy  occasionally Thanked client for phone call. Encouraged Gwinda Leopard to call LCSW as needed for SW support at [phone].  Client was appreciative of call from LCSW today          Our next appointment is by telephone on 12/28/23 at 10:00 AM   Please call the care guide team at [phone] if you need to cancel or reschedule your appointment.   If you are experiencing a Mental Health or Behavioral Health Crisis or need someone to talk to, please go to [facility] [address], [place] [phone])   The patient verbalized understanding of instructions, educational materials, and care plan provided today and DECLINED offer to receive copy of patient instructions, educational materials, and care plan.   The patient has been provided with contact information for the care management team and has been advised to call with any health related questions or concerns.    Alexandria Angel  MSW, LCSW [facility] Licensed Clinical Social Worker Direct Dial:  [phone] Fax:  [phone] Website:  [email or web address]

## 2023-10-21 ENCOUNTER — Ambulatory Visit: Payer: Medicare Other

## 2023-10-21 VITALS — Ht 71.5 in | Wt 206.0 lb

## 2023-10-21 DIAGNOSIS — Z0000 Encounter for general adult medical examination without abnormal findings: Secondary | ICD-10-CM

## 2023-10-21 NOTE — Patient Instructions (Addendum)
Cesar [name] , Thank you for taking time to come for your Medicare Wellness Visit. I appreciate your ongoing commitment to your health goals. Please review the following plan we discussed and let me know if I can assist you in the future.   Referrals/Orders/Follow-Ups/Clinician Recommendations:   This is a list of the screening recommended for you and due dates:  Health Maintenance  Topic Date Due   COVID-19 Vaccine (8 - 2024-25 season) 09/16/2023   Medicare Annual Wellness Visit  10/20/2024   DTaP/Tdap/Td vaccine (3 - Td or Tdap) 08/07/2032   Pneumonia Vaccine  Completed   Flu Shot  Completed   Hepatitis C Screening  Completed   Zoster (Shingles) Vaccine  Completed   HPV Vaccine  Aged Out   Colon Cancer Screening  Discontinued    Advanced directives: (Copy Requested) Please bring a copy of your health care power of attorney and living will to the office to be added to your chart at your convenience.  Next Medicare Annual Wellness Visit scheduled for next year: Yes

## 2023-10-21 NOTE — Progress Notes (Signed)
Subjective:   Cesar [name] is a 78 y.o. male who presents for Medicare Annual/Subsequent preventive examination.  Visit Complete: Virtual I connected with  Corinne Ports on 10/21/23 by a audio enabled telemedicine application and verified that I am speaking with the correct person using two identifiers.  Patient Location: Home  Provider Location: Home Office  I discussed the limitations of evaluation and management by telemedicine. The patient expressed understanding and agreed to proceed.  Vital Signs: Because this visit was a virtual/telehealth visit, some criteria may be missing or patient reported. Any vitals not documented were not able to be obtained and vitals that have been documented are patient reported.    Cardiac Risk Factors include: advanced age (>68men, >30 women);male gender     Objective:    Today's Vitals   10/21/23 1356  Weight: 206 lb (93.4 kg)  Height: 5' 11.5" (1.816 m)   Body mass index is 28.33 kg/m.     10/21/2023    2:06 PM 03/02/2023    1:22 PM 10/15/2022    3:01 PM 10/14/2021    1:49 PM  Advanced Directives  Does Patient Have a Medical Advance Directive? Yes Yes Yes Yes  Type of Estate agent of [place];Living will  Healthcare Power of [place];Living will Healthcare Power of [place];Living will  Does patient want to make changes to medical advance directive?  No - Patient declined No - Patient declined   Copy of Healthcare Power of Attorney in Chart? No - copy requested  No - copy requested No - copy requested    Current Medications (verified) Outpatient Encounter Medications as of 10/21/2023  Medication Sig   ALPRAZolam (XANAX) 0.5 MG tablet Take 1 tablet (0.5 mg total) by mouth 3 (three) times daily as needed for anxiety.   Calcium-Vitamin D 600-200 MG-UNIT tablet Take 1 tablet by mouth daily.   divalproex (DEPAKOTE ER) 500 MG 24 hr tablet Take 1 tablet (500 mg total) by mouth daily.   FLUoxetine (PROZAC) 40 MG  capsule Take 1 capsule (40 mg total) by mouth daily.   hydrOXYzine (VISTARIL) 25 MG capsule Take 25 mg by mouth 3 (three) times daily as needed for anxiety.   lovastatin (MEVACOR) 10 MG tablet Take 1 tablet (10 mg total) by mouth at bedtime.   metoprolol tartrate (LOPRESSOR) 25 MG tablet TAKE 1/2 TABLET BY MOUTH TWICE DAILY   midodrine (PROAMATINE) 5 MG tablet Take 1 tablet (5 mg total) by mouth 2 (two) times daily with a meal.   nitroGLYCERIN (NITROSTAT) 0.4 MG SL tablet Place 1 tablet (0.4 mg total) under the tongue every 5 (five) minutes as needed for chest pain.   pantoprazole (PROTONIX) 40 MG tablet Take 1 tablet (40 mg total) by mouth daily.   No facility-administered encounter medications on file as of 10/21/2023.    Allergies (verified) Formaldehyde, Latex, Sulfa antibiotics, Butorphanol, Celecoxib, Hydrocodone-acetaminophen, Oxycodone-acetaminophen, Povidone iodine, Metformin hcl, Simvastatin, Tamsulosin, Nifedipine, Nitroglycerin, Povidone-iodine, Quaternium-15, Rofecoxib, and Tape   History: Past Medical History:  Diagnosis Date   Allergy    Anxiety    Arthritis    Back pain, lumbosacral    Back pain, thoracic    Coronary artery disease    normal RCA, LCX, 50% prox/mid LAD with "normal" FFR 06/09/17 LHC, medical therapy   Depression    Eczema    GERD (gastroesophageal reflux disease)    Prostatitis    PTSD (post-traumatic stress disorder)    RSD (reflex sympathetic dystrophy)    on  the right side   Vertigo    Past Surgical History:  Procedure Laterality Date   ANTERIOR CERVICAL DECOMP/DISCECTOMY FUSION N/A 03/16/2023   Procedure: CERVICAL FOUR-FIVE CERVICAL FIVE-SIX ANTERIOR CERVICAL DECOMPRESSION/DISCECTOMY FUSION;  Surgeon: Barnett Abu, MD;  Location: [facility] OR;  Service: Neurosurgery;  Laterality: N/A;  RM 21 TO FOLLOW   BACK SURGERY  march 1998 and december 1998   carpel tunnel Left    cholecystectectomy     CHOLECYSTECTOMY     ESOPHAGEAL DILATION     stretching of  the esophagus   ESOPHAGEAL MANOMETRY N/A 07/01/2022   Procedure: ESOPHAGEAL MANOMETRY (EM);  Surgeon: Shellia Cleverly, DO;  Location: [facility];  Service: Gastroenterology;  Laterality: N/A;   NASAL SINUS SURGERY     NASAL SINUS SURGERY     thumb joint replacement Left 2003   TONSILLECTOMY     Family History  Problem Relation Age of Onset   Other Mother        had a hole in her heart   Pneumonia Father        died with pneumonia   Allergic rhinitis Neg Hx    Angioedema Neg Hx    Asthma Neg Hx    Eczema Neg Hx    Immunodeficiency Neg Hx    Urticaria Neg Hx    Colon cancer Neg Hx    Colon polyps Neg Hx    Stomach cancer Neg Hx    Pancreatic cancer Neg Hx    Rectal cancer Neg Hx    Esophageal cancer Neg Hx    Social History   Socioeconomic History   Marital status: Widowed    Spouse name: Not on file   Number of children: 0   Years of education: Not on file   Highest education level: Not on file  Occupational History   Not on file  Tobacco Use   Smoking status: Never   Smokeless tobacco: Never  Vaping Use   Vaping status: Never Used  Substance and Sexual Activity   Alcohol use: No   Drug use: No   Sexual activity: Not on file  Other Topics Concern   Not on file  Social History Narrative   Not on file   Social Drivers of Health   Financial Resource Strain: Low Risk  (10/21/2023)   Overall Financial Resource Strain (CARDIA)    Difficulty of Paying Living Expenses: Not hard at all  Food Insecurity: No Food Insecurity (10/21/2023)   Hunger Vital Sign    Worried About Running Out of Food in the Last Year: Never true    Ran Out of Food in the Last Year: Never true  Transportation Needs: No Transportation Needs (10/21/2023)   PRAPARE - Administrator, Civil Service (Medical): No    Lack of Transportation (Non-Medical): No  Physical Activity: Sufficiently Active (10/21/2023)   Exercise Vital Sign    Days of Exercise per Week: 7 days    Minutes of  Exercise per Session: 30 min  Recent Concern: Physical Activity - Insufficiently Active (10/18/2023)   Exercise Vital Sign    Days of Exercise per Week: 5 days    Minutes of Exercise per Session: 10 min  Stress: No Stress Concern Present (10/21/2023)   [employer] of Occupational Health - Occupational Stress Questionnaire    Feeling of Stress : Not at all  Recent Concern: Stress - Stress Concern Present (10/18/2023)   [employer] of Occupational Health - Occupational Stress Questionnaire  Feeling of Stress : To some extent  Social Connections: Moderately Integrated (10/21/2023)   Social Connection and Isolation Panel [NHANES]    Frequency of Communication with Friends and Family: More than three times a week    Frequency of Social Gatherings with Friends and Family: More than three times a week    Attends Religious Services: More than 4 times per year    Active Member of [employer] or Organizations: Yes    Attends Banker Meetings: More than 4 times per year    Marital Status: Widowed    Tobacco Counseling Counseling given: Not Answered   Clinical Intake:  Pre-visit preparation completed: Yes  Pain : No/denies pain     BMI - recorded: 28.33 Nutritional Status: BMI 25 -29 Overweight Nutritional Risks: None Diabetes: No  How often do you need to have someone help you when you read instructions, pamphlets, or other written materials from your doctor or pharmacy?: 1 - Never  Interpreter Needed?: No  Information entered by :: Theresa Mulligan LPN   Activities of Daily Living    10/21/2023    2:05 PM 03/02/2023    1:28 PM  In your present state of health, do you have any difficulty performing the following activities:  Hearing? 0   Vision? 0   Difficulty concentrating or making decisions? 0   Walking or climbing stairs? 1   Comment Uses a Cane   Dressing or bathing? 0   Doing errands, shopping? 0 1  Preparing Food and eating ? N   Using the Toilet? N    In the past six months, have you accidently leaked urine? N   Do you have problems with loss of bowel control? N   Managing your Medications? N   Managing your Finances? N   Housekeeping or managing your Housekeeping? N     Patient Care Team: Sharlene Dory, DO as PCP - General (Family Medicine) Lisabeth Pick, MD as Referring Physician (Psychiatry)  Indicate any recent Medical Services you may have received from other than [facility] providers in the past year (date may be approximate).     Assessment:   This is a routine wellness examination for Blase.  Hearing/Vision screen Hearing Screening - Comments:: Denies hearing difficulties   Vision Screening - Comments:: Wears rx glasses - up to date with routine eye exams with  [facility]   Goals Addressed               This Visit's Progress     Continue to stay active (pt-stated)         Depression Screen    10/21/2023    2:02 PM 10/18/2023    3:18 PM 08/23/2023    2:26 PM 07/06/2023   10:10 AM 05/17/2023   10:26 AM 12/09/2022    2:15 PM 10/14/2022    2:21 PM  PHQ 2/9 Scores  PHQ - 2 Score 0 2 2 2 2  0 0  PHQ- 9 Score 0 5 6 6 5  0 0    Fall Risk    10/21/2023    2:05 PM 10/14/2022    2:21 PM 08/07/2022   12:01 PM 10/14/2021    1:54 PM 02/05/2021    1:49 PM  Fall Risk   Falls in the past year? 0 0 0 0 0  Number falls in past yr: 0 0 0 0 0  Injury with Fall? 0 0 0 0 0  Risk for fall due to : No  Fall Risks No Fall Risks   No Fall Risks  Follow up Falls prevention discussed;Falls evaluation completed Falls evaluation completed Falls evaluation completed Falls prevention discussed Falls evaluation completed    MEDICARE RISK AT HOME: Medicare Risk at Home Any stairs in or around the home?: No If so, are there any without handrails?: No Home free of loose throw rugs in walkways, pet beds, electrical cords, etc?: Yes Adequate lighting in your home to reduce risk of falls?: Yes Life alert?: Yes Use of a  cane, walker or w/c?: Yes Grab bars in the bathroom?: Yes Shower chair or bench in shower?: Yes Elevated toilet seat or a handicapped toilet?: Yes  TIMED UP AND GO:  Was the test performed?  No    Cognitive Function:        10/21/2023    2:07 PM 10/15/2022    3:12 PM  6CIT Screen  What Year? 0 points 0 points  What month? 0 points 0 points  What time? 0 points 0 points  Count back from 20 0 points 0 points  Months in reverse 0 points 0 points  Repeat phrase 0 points 2 points  Total Score 0 points 2 points    Immunizations Immunization History  Administered Date(s) Administered   Fluad Quad(high Dose 65+) 06/11/2021, 06/09/2022   Fluad Trivalent(High Dose 65+) 06/30/2023   Influenza, High Dose Seasonal PF 06/21/2020   PFIZER Comirnaty(Gray Top)Covid-19 Tri-Sucrose Vaccine 04/07/2021, 07/22/2023   PFIZER(Purple Top)SARS-COV-2 Vaccination 01/25/2020, 02/15/2020, 08/19/2020, 04/07/2021   Pfizer Covid-19 Vaccine Bivalent Booster 71yrs & up 06/11/2021   Pfizer(Comirnaty)Fall Seasonal Vaccine 12 years and older 07/08/2022   Pneumococcal Conjugate-13 07/04/2015   Pneumococcal Polysaccharide-23 06/28/2014   Td 08/07/2022   Tdap 06/06/2013   Zoster Recombinant(Shingrix) 11/11/2021, 04/13/2022   Zoster, Live 04/20/2008    TDAP status: Up to date  Flu Vaccine status: Up to date  Pneumococcal vaccine status: Up to date  Covid-19 vaccine status: Declined, Education has been provided regarding the importance of this vaccine but patient still declined. Advised may receive this vaccine at local pharmacy or Health Dept.or vaccine clinic. Aware to provide a copy of the vaccination record if obtained from local pharmacy or Health Dept. Verbalized acceptance and understanding.  Qualifies for Shingles Vaccine? Yes   Zostavax completed Yes   Shingrix Completed?: Yes  Screening Tests Health Maintenance  Topic Date Due   COVID-19 Vaccine (8 - 2024-25 season) 09/16/2023   Medicare  Annual Wellness (AWV)  10/20/2024   DTaP/Tdap/Td (3 - Td or Tdap) 08/07/2032   Pneumonia Vaccine 16+ Years old  Completed   INFLUENZA VACCINE  Completed   Hepatitis C Screening  Completed   Zoster Vaccines- Shingrix  Completed   HPV VACCINES  Aged Out   Colonoscopy  Discontinued    Health Maintenance  Health Maintenance Due  Topic Date Due   COVID-19 Vaccine (8 - 2024-25 season) 09/16/2023        Additional Screening:  Hepatitis C Screening: does qualify; Completed 11/12/06  Vision Screening: Recommended annual ophthalmology exams for early detection of glaucoma and other disorders of the eye. Is the patient up to date with their annual eye exam?  Yes  Who is the provider or what is the name of the office in which the patient attends annual eye exams? [facility] If pt is not established with a provider, would they like to be referred to a provider to establish care? No .   Dental Screening: Recommended annual dental exams for  proper oral hygiene    Community Resource Referral / Chronic Care Management:  CRR required this visit?  No   CCM required this visit?  No     Plan:     I have personally reviewed and noted the following in the patient's chart:   Medical and social history Use of alcohol, tobacco or illicit drugs  Current medications and supplements including opioid prescriptions. Patient is not currently taking opioid prescriptions. Functional ability and status Nutritional status Physical activity Advanced directives List of other physicians Hospitalizations, surgeries, and ER visits in previous 12 months Vitals Screenings to include cognitive, depression, and falls Referrals and appointments  In addition, I have reviewed and discussed with patient certain preventive protocols, quality metrics, and best practice recommendations. A written personalized care plan for preventive services as well as general preventive health recommendations were  provided to patient.     Tillie Rung, LPN   1/61/0960   After Visit Summary: (MyChart) Due to this being a telephonic visit, the after visit summary with patients personalized plan was offered to patient via MyChart   Nurse Notes: None

## 2023-11-05 ENCOUNTER — Ambulatory Visit: Payer: Self-pay | Admitting: Family Medicine

## 2023-11-05 ENCOUNTER — Telehealth: Payer: Self-pay | Admitting: Family Medicine

## 2023-11-05 NOTE — Telephone Encounter (Signed)
 Copied from CRM [id number]. Topic: General - Other >> Nov 05, 2023 11:53 AM Prudencio Pair wrote: Reason for CRM: Patient called back to give a message to Triage Nurse, Huntley Dec. He states he did call the EMS & they came out & checked him out. Stated everything was fine. Patient stated his vitals were good & EMS said they didn't see a reason why he needed to go to the ER/hospital. Patient declined speaking back with Triage Nurse, just wanted to leave a message for her. States he is good to go.

## 2023-11-05 NOTE — Telephone Encounter (Signed)
 Chief Complaint: chest pain Symptoms: lower chest/epigastric pain, abdominal pain Frequency: constant for 3-4 days Pertinent Negatives: Patient denies SOB, weakness, N/V/D Disposition: [x] ED /[] Urgent Care (no appt availability in office) / [] Appointment(In office/virtual)/ []  [place] [facility]/ [] Home Care/ [] Refused Recommended Disposition /[] [place] [facility]/ []  Follow-up with PCP Additional Notes: Pt reports 3-4 days of lower chest/epigastric pain and abdominal pain. Pt rates the pain 7-8/10. Pt states the pain kept him up last night. Pt endorses some nausea but no vomiting or diarrhea. Denies SOB. States he has some dizziness but endorses hx of vertigo. Still eating and drinking normally. States his abdomen may look bloated "a little." Also endorses leg pain, states he has had leg pain since last July when he had spinal surgery. Denies redness/color change, a knot under the skin, skin that is warm to the touch. Denies leg swelling but states his socks have left an indentation. Per protocol for CP, RN advised pt he needs to go to the ED. RN advised pt the best and safest way for him to get there would be with EMS. RN offered to call 911 for the pt. Pt declined at this time and states he would like to have his coffee first. Pt stated that he would call 911 for himself later once he has his coffee. RN educated pt on why EMS and the ED is the safest way to address his symptoms and he verbalized understanding. RN advised pt that if his CP worsens or becomes SOB or experiences any worsening then he needs to call 911 right away. He verbalized understanding.   Copied from CRM [id number]. Topic: Clinical - Red Word Triage >> Nov 05, 2023  9:03 AM Isabell A wrote: Red Word that prompted transfer to Nurse Triage: Abdominal pain from one side to another & lower chest pain. Reason for Disposition  [1] Chest pain lasts > 5 minutes AND [2] age > 50  Answer Assessment - Initial Assessment  Questions 1. LOCATION: "Where does it hurt?"       "Right below my boobs" and my stomach 2. RADIATION: "Does the pain go anywhere else?" (e.g., into neck, jaw, arms, back)     "Not really", just had spinal surgery in July, think it is coming from that 3. ONSET: "When did the chest pain begin?" (Minutes, hours or days)      "Been like that about 3-4 days and it's got a little bit worse and it didn't bother me much until last night and then it kept me up last night" 4. PATTERN: "Does the pain come and go, or has it been constant since it started?"  "Does it get worse with exertion?"      Constant, stays about the same with exertion 5. DURATION: "How long does it last" (e.g., seconds, minutes, hours)     Constant 6. SEVERITY: "How bad is the pain?"  (e.g., Scale 1-10; mild, moderate, or severe)    - MILD (1-3): doesn't interfere with normal activities     - MODERATE (4-7): interferes with normal activities or awakens from sleep    - SEVERE (8-10): excruciating pain, unable to do any normal activities       7-8/10, it's a little bit uncomfortable 7. CARDIAC RISK FACTORS: "Do you have any history of heart problems or risk factors for heart disease?" (e.g., angina, prior heart attack; diabetes, high blood pressure, high cholesterol, smoker, or strong family history of heart disease)     Angina at rest 8. PULMONARY  RISK FACTORS: "Do you have any history of lung disease?"  (e.g., blood clots in lung, asthma, emphysema, birth control pills)     None 9. CAUSE: "What do you think is causing the chest pain?"     Not sure  10. 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

## 2023-11-05 NOTE — Telephone Encounter (Signed)
 Called patient and scheduled him for 11/08/23 @ 12:45PM.

## 2023-11-08 ENCOUNTER — Encounter: Payer: Self-pay | Admitting: Family Medicine

## 2023-11-08 ENCOUNTER — Ambulatory Visit (INDEPENDENT_AMBULATORY_CARE_PROVIDER_SITE_OTHER): Payer: Medicare Other | Admitting: Family Medicine

## 2023-11-08 VITALS — BP 134/84 | HR 76 | Temp 97.7°F | Resp 18 | Ht 71.5 in | Wt 216.0 lb

## 2023-11-08 DIAGNOSIS — K219 Gastro-esophageal reflux disease without esophagitis: Secondary | ICD-10-CM

## 2023-11-08 MED ORDER — PANTOPRAZOLE SODIUM 40 MG PO TBEC: 40.0000 mg | DELAYED_RELEASE_TABLET | Freq: Every day | ORAL | 1 refills | Status: DC

## 2023-11-08 MED ORDER — METOPROLOL TARTRATE 25 MG PO TABS: 12.5000 mg | ORAL_TABLET | Freq: Two times a day (BID) | ORAL | 2 refills | Status: DC

## 2023-11-08 NOTE — Patient Instructions (Signed)
 The only lifestyle changes that have data behind them are weight loss for the overweight/obese and elevating the head of the bed. Finding out which foods/positions are triggers is important.  Pepcid/famotidine 20 mg 1-2 times daily can help with reflux symptoms.   Let us know if you need anything.

## 2023-11-08 NOTE — Progress Notes (Signed)
 Chief Complaint  Patient presents with   Acute Visit    Patient presents today for follow-up on acid reflux per pt    Subjective: Patient is a 78 y.o. male here for abd pain.  Started around 4 d ago he ate some [place] food that led to upper abdominal pain and burning in his chest. There were some green peppers in the dish which historically flares his reflux. He has a hx of reflux. Compliant w Protonix 40 mg/d. No AE's. No new difficulty swallowing, blood in stool or tarry stools.   Past Medical History:  Diagnosis Date   Allergy    Anxiety    Arthritis    Back pain, lumbosacral    Back pain, thoracic    Coronary artery disease    normal RCA, LCX, 50% prox/mid LAD with "normal" FFR 06/09/17 LHC, medical therapy   Depression    Eczema    GERD (gastroesophageal reflux disease)    Prostatitis    PTSD (post-traumatic stress disorder)    RSD (reflex sympathetic dystrophy)    on the right side   Vertigo     Objective: BP 134/84   Pulse 76   Temp 97.7 F (36.5 C)   Resp 18   Ht 5' 11.5" (1.816 m)   Wt 216 lb (98 kg)   SpO2 99%   BMI 29.71 kg/m  General: Awake, appears stated age Heart: RRR, no LE edema Lungs: CTAB, no rales, wheezes or rhonchi. No accessory muscle use Abd: BS+, S, NT, ND Psych: Age appropriate judgment and insight, normal affect and mood  Assessment and Plan: Gastroesophageal reflux disease without esophagitis  Chronic, now stable. Cont Protonix 40 mg/d. Pepcid 20 mg 1-2 times daily as needed. F/u as originally scheduled.  The patient voiced understanding and agreement to the plan.  [name] [place], DO 11/08/23  12:53 PM

## 2023-11-15 ENCOUNTER — Other Ambulatory Visit: Payer: Self-pay | Admitting: Family Medicine

## 2023-11-15 DIAGNOSIS — F4310 Post-traumatic stress disorder, unspecified: Secondary | ICD-10-CM

## 2023-11-15 DIAGNOSIS — F411 Generalized anxiety disorder: Secondary | ICD-10-CM

## 2023-12-28 ENCOUNTER — Ambulatory Visit: Payer: Medicare Other | Admitting: Licensed Clinical Social Worker

## 2023-12-28 NOTE — Patient Instructions (Signed)
 Visit Information  Thank you for taking time to visit with me today. Please don't hesitate to contact me if I can be of assistance to you before our next scheduled appointment.   Client to call LCSW as needed for SW support at [phone]  Please call the care guide team at [phone] if you need to cancel or reschedule your appointment.   Following is a copy of your care plan:   Goals Addressed             This Visit's Progress    VBCI Social Work Care Plan       Problems:   Some transport needs             Needs some help with meals (neighbors and friends often bring food to [employer])             Low energy level occasionally             GAD; PTSD                       CSW Clinical Goal(s):   Over the next 30    days the Patient will attend all scheduled medical appointments AEB patient report and as documented in EPIC record            Over next 30 days, patient will continue to socialize with neighbors, friends, or other Veterans to talk about client interests AEB patient report.  Interventions:  Spoke with Gwinda Leopard about medication procurement             Discussed client support from [facility] in [place] , [place]             Discussed transport needs of client. His neighbors sometimes help client with transport needs. Discussed sleeping of client. Discussed appetite of client             Discussed program support             Used Active Listening techniques to allow client to share his feelings and needs             Discussed military service of client. He served in [employer]            Encouraged client to call LCSW as needed for SW support at [phone]                              Patient Goals/Self-Care Activities:  Connect with provider for ongoing mental health treatment.   Attend scheduled medical appointments; take medications as prescribed Socialize with neighbors and friends to discuss interests Allow time for rest and relaxation Eats meals as  scheduled Attend appointments scheduled at [facility] in [place], [place] Communicate as needed with PCP about medical needs of client Call LCSW as needed for SW needs  Plan:   Client has LCSW name and phone number and was encouraged to call LCSW as needed for SW support        Please go to [facility] [address], [place] [phone]) if you are experiencing a Mental Health or Behavioral Health Crisis or need someone to talk to.  The patient verbalized understanding of instructions, educational materials, and care plan provided today and DECLINED offer to receive copy of patient instructions, educational materials, and care plan.   Patient was provided with contact information for Care Team. LCSW gave client LCSW  name and phone number for client to use for SW needs of client   Cesar [name]  MSW, LCSW [place] Licensed Clinical Social Worker Direct Dial:  [phone] Fax:  [phone] Website:  [email or web address]

## 2023-12-28 NOTE — Patient Outreach (Signed)
 Complex Care Management   Visit Note  12/28/2023  Name:  Cesar [name] MRN: [id number] DOB: 01-19-1946  Situation: Referral received for Complex Care Management related to  client's management of his health needs   I obtained verbal consent from Patient.  Visit completed with patient  on the phone  Background:   Past Medical History:  Diagnosis Date   Allergy    Anxiety    Arthritis    Back pain, lumbosacral    Back pain, thoracic    Coronary artery disease    normal RCA, LCX, 50% prox/mid LAD with "normal" FFR 06/09/17 LHC, medical therapy   Depression    Eczema    GERD (gastroesophageal reflux disease)    Prostatitis    PTSD (post-traumatic stress disorder)    RSD (reflex sympathetic dystrophy)    on the right side   Vertigo     Assessment: Patient Reported Symptoms:  Cognitive    No issues noted    Neurological    PTSD; GAD  HEENT  Dysphagia      Cardiovascular   Weight: 206 lb (93.4 kg)  Respiratory    No issues noted  Endocrine    No issues noted  Gastrointestinal    Not issues noted    Genitourinary  No problems noted    Integumentary    No problems noted  Musculoskeletal    Walks for exercise. Some weakness occasionally.      Psychosocial    Needs help with meal provision (gets some food support from his neighbors Needs some transport help occasionally   Quality of Family Relationships: supportive Do you feel physically threatened by others?: No      12/28/2023   10:40 AM  Depression screen PHQ 2/9  Decreased Interest 1  Down, Depressed, Hopeless 1  PHQ - 2 Score 2  Altered sleeping 0  Tired, decreased energy 1  Change in appetite 0  Feeling bad or failure about yourself  0  Trouble concentrating 1  Moving slowly or fidgety/restless 1  Suicidal thoughts 0  PHQ-9 Score 5  Difficult doing work/chores Somewhat difficult    Vitals:   12/28/23 1034  BP: 117/78    Medications Reviewed Today     Reviewed by Suann Elms,  LCSW (Social Worker) on 12/28/23 at 1038  Med List Status: <None>   Medication Order Taking? Sig Documenting Provider Last Dose Status Informant  ALPRAZolam  (XANAX ) 0.5 MG tablet [id number] No Take 1 tablet (0.5 mg total) by mouth 3 (three) times daily as needed for anxiety. Jobe Mulder, DO Taking Active   Calcium -Vitamin D 600-200 MG-UNIT tablet [id number] No Take 1 tablet by mouth daily. [provider] Taking Active Self           Med Note Diantha Fossa   Tue Feb 10, 2022  2:01 PM)    divalproex  (DEPAKOTE  ER) 500 MG 24 hr tablet [id number] No Take 1 tablet (500 mg total) by mouth daily. Jobe Mulder, DO Taking Active   FLUoxetine  (PROZAC ) 40 MG capsule [id number]  TAKE 1 CAPSULE BY MOUTH DAILY Jobe Mulder, DO  Active   hydrOXYzine  (VISTARIL ) 25 MG capsule [id number] No Take 25 mg by mouth 3 (three) times daily as needed for anxiety. [provider] Taking Active Self  lovastatin  (MEVACOR ) 10 MG tablet [id number] No Take 1 tablet (10 mg total) by mouth at bedtime. Jobe Mulder, DO Taking Active   metoprolol  tartrate (LOPRESSOR ) 25 MG tablet [id number]  Take 0.5 tablets (12.5 mg total) by mouth 2 (two) times daily. Jobe Mulder, DO  Active   midodrine  (PROAMATINE ) 5 MG tablet [id number] No Take 1 tablet (5 mg total) by mouth 2 (two) times daily with a meal. Jobe Mulder, DO Taking Active   nitroGLYCERIN  (NITROSTAT ) 0.4 MG SL tablet [id number] No Place 1 tablet (0.4 mg total) under the tongue every 5 (five) minutes as needed for chest pain. Jobe Mulder, DO Taking Active Self           Med Note Dennice Fishman A   Wed Feb 24, 2023 10:55 AM)    pantoprazole  (PROTONIX ) 40 MG tablet [id number]  Take 1 tablet (40 mg total) by mouth daily. Jobe Mulder, DO  Active             Recommendation:   Client to attend medical appointments Client to take medications as prescribed Client to socialize with  neighbors, friends, other Veterans to share interests Client to communicate as needed with LCSW for SW support Client to attend appointments scheduled for client at [facility] in [place], [place]  Follow Up Plan:   Client has LCSW name and phone number and can call LCSW as needed for SW support   Alexandria Angel  MSW, LCSW [place] Licensed Clinical Social Worker Direct Dial:  [phone] Fax:  [phone] Website:  [email or web address]

## 2024-01-28 ENCOUNTER — Ambulatory Visit: Payer: Self-pay | Admitting: Family Medicine

## 2024-01-28 ENCOUNTER — Ambulatory Visit (INDEPENDENT_AMBULATORY_CARE_PROVIDER_SITE_OTHER): Payer: Medicare Other | Admitting: Family Medicine

## 2024-01-28 ENCOUNTER — Encounter: Payer: Self-pay | Admitting: Family Medicine

## 2024-01-28 VITALS — BP 128/74 | HR 72 | Temp 98.0°F | Resp 16 | Ht 71.0 in | Wt 212.4 lb

## 2024-01-28 DIAGNOSIS — E7800 Pure hypercholesterolemia, unspecified: Secondary | ICD-10-CM | POA: Diagnosis not present

## 2024-01-28 DIAGNOSIS — H9201 Otalgia, right ear: Secondary | ICD-10-CM

## 2024-01-28 DIAGNOSIS — M26629 Arthralgia of temporomandibular joint, unspecified side: Secondary | ICD-10-CM

## 2024-01-28 DIAGNOSIS — R519 Headache, unspecified: Secondary | ICD-10-CM

## 2024-01-28 DIAGNOSIS — F411 Generalized anxiety disorder: Secondary | ICD-10-CM | POA: Diagnosis not present

## 2024-01-28 LAB — COMPREHENSIVE METABOLIC PANEL WITH GFR
ALT: 10 U/L (ref 0–53)
AST: 17 U/L (ref 0–37)
Albumin: 4.6 g/dL (ref 3.5–5.2)
Alkaline Phosphatase: 72 U/L (ref 39–117)
BUN: 15 mg/dL (ref 6–23)
CO2: 28 meq/L (ref 19–32)
Calcium: 9.1 mg/dL (ref 8.4–10.5)
Chloride: 103 meq/L (ref 96–112)
Creatinine, Ser: 0.84 mg/dL (ref 0.40–1.50)
GFR: 84.68 mL/min (ref >60.00)
Glucose, Bld: 88 mg/dL (ref 70–99)
Potassium: 3.9 meq/L (ref 3.5–5.1)
Sodium: 139 meq/L (ref 135–145)
Total Bilirubin: 0.9 mg/dL (ref 0.2–1.2)
Total Protein: 6.7 g/dL (ref 6.0–8.3)

## 2024-01-28 LAB — LIPID PANEL
Cholesterol: 164 mg/dL (ref 0–200)
HDL: 57.5 mg/dL (ref >39.00)
LDL Cholesterol: 83 mg/dL (ref 0–99)
NonHDL: 106.79
Total CHOL/HDL Ratio: 3
Triglycerides: 118 mg/dL (ref 0.0–149.0)
VLDL: 23.6 mg/dL (ref 0.0–40.0)

## 2024-01-28 MED ORDER — CIPROFLOXACIN HCL 0.3 % OP SOLN: OPHTHALMIC | 0 refills | Status: DC

## 2024-01-28 NOTE — Progress Notes (Signed)
 Chief Complaint  Patient presents with   Follow-up    Follow Up    Subjective Cesar [name] presents for f/u anxiety/depression.  Pt is currently being treated with Xanax  0.5 mg TID prn, hydroxyzyine 25-75 mg TID prn, Prozac  40 mg/d.  Reports doing OK/stable since treatment. No thoughts of harming self or others. No self-medication with alcohol, prescription drugs or illicit drugs. Pt is following with a counselor/psychologist.  Hyperlipidemia Patient presents for dyslipidemia follow up. Currently being treated with Mevacor  10 mg/d and compliance with treatment thus far has been good. He denies myalgias. He is usually adhering to a healthy diet. Exercise: walking No Cp or SOB.  The patient is known to have coexisting coronary artery disease.  Chronic daily headache Taking Depakote  500 mg daily. Pain is on the top of the head and going to the R side. Denies trauma. Getting more freq headaches. No obvious trigger for this. Nothing makes it better or worse.   Past Medical History:  Diagnosis Date   Allergy    Anxiety    Arthritis    Back pain, lumbosacral    Back pain, thoracic    Coronary artery disease    normal RCA, LCX, 50% prox/mid LAD with "normal" FFR 06/09/17 LHC, medical therapy   Depression    Eczema    GERD (gastroesophageal reflux disease)    Prostatitis    PTSD (post-traumatic stress disorder)    RSD (reflex sympathetic dystrophy)    on the right side   Vertigo    Allergies as of 01/28/2024       Reactions   Formaldehyde Itching, Rash   SOLN   Latex Itching   Sulfa Antibiotics Itching, Nausea And Vomiting, Rash   15 POWD SOLN   Butorphanol Other (See Comments)   Drowsiness   Celecoxib Rash   Flushing   Hydrocodone -acetaminophen  Nausea And Vomiting   Oxycodone-acetaminophen  Nausea And Vomiting   Povidone Iodine Rash   Metformin Hcl Other (See Comments)   *blurred vision, 11/06/2009   Simvastatin Other (See Comments)   Myalgias   Tamsulosin Other  (See Comments)   Dyspnea   Nifedipine Nausea Only   Nitroglycerin  Rash   Nitroglycerin  patch developed rash and blisters from adhesive and latex   Povidone-iodine Rash   Quaternium-15 Rash   Rofecoxib Nausea Only   Flushing   Tape Itching, Rash        Medication List        Accurate as of Jan 28, 2024  1:58 PM. If you have any questions, ask your nurse or doctor.          ALPRAZolam  0.5 MG tablet Commonly known as: XANAX  Take 1 tablet (0.5 mg total) by mouth 3 (three) times daily as needed for anxiety.   Calcium -Vitamin D 600-200 MG-UNIT tablet Take 1 tablet by mouth daily.   ciprofloxacin 0.3 % ophthalmic solution Commonly known as: CILOXAN Administer 4 drops twice daily in the R ear for 7 days. Started by: Jobe Mulder   divalproex  500 MG 24 hr tablet Commonly known as: Depakote  ER Take 1 tablet (500 mg total) by mouth daily.   FLUoxetine  40 MG capsule Commonly known as: PROZAC  TAKE 1 CAPSULE BY MOUTH DAILY   hydrOXYzine  25 MG capsule Commonly known as: VISTARIL  Take 25 mg by mouth 3 (three) times daily as needed for anxiety.   lovastatin  10 MG tablet Commonly known as: MEVACOR  Take 1 tablet (10 mg total) by mouth at bedtime.   metoprolol  tartrate  25 MG tablet Commonly known as: LOPRESSOR  Take 0.5 tablets (12.5 mg total) by mouth 2 (two) times daily.   midodrine  5 MG tablet Commonly known as: PROAMATINE  Take 1 tablet (5 mg total) by mouth 2 (two) times daily with a meal.   nitroGLYCERIN  0.4 MG SL tablet Commonly known as: NITROSTAT  Place 1 tablet (0.4 mg total) under the tongue every 5 (five) minutes as needed for chest pain.   pantoprazole  40 MG tablet Commonly known as: PROTONIX  Take 1 tablet (40 mg total) by mouth daily.        Exam BP 128/74 (BP Location: Left Arm, Patient Position: Sitting)   Pulse 72   Temp 98 F (36.7 C) (Oral)   Resp 16   Ht 5\' 11"  (1.803 m)   Wt 212 lb 6.4 oz (96.3 kg)   SpO2 98%   BMI 29.62 kg/m   General:  well developed, well nourished, in no apparent distress Heart: RRR, no bruits, no LE edema Lungs: CTAB. No respiratory distress Neuro: Gait cautious w assistive device (cane), DTR's equal and symmetric throughout MSK: +TTP over R TMJ; no ttp over L TMJ, temporalis, subocc triangle, cerv parasp msc Psych: well oriented with normal range of affect and age-appropriate judgement/insight, alert and oriented x4.  Assessment and Plan  GAD (generalized anxiety disorder)  Pure hypercholesterolemia - Plan: Comprehensive metabolic panel with GFR, Lipid panel  Chronic daily headache  TMJ syndrome  Right ear pain  Chronic, stable. Cont Prozac  40 mg/d, Hydroxyzine  prn, Xanax  prn. Cont w counseling.  Chronic, stable. Cont Mevacor  10 mg/d. Counseled on diet/exercise. Chronic, stable. Cont Depakote  500 mg/d.  I think this is the issue. Will give stretches/exercises, heat, ice, Tylenol .  Topical tx for AOE. Send message if no better.  F/u in 6 mo or prn. The patient voiced understanding and agreement to the plan.  Shellie Dials [place], DO 01/28/24 1:58 PM

## 2024-01-28 NOTE — Patient Instructions (Signed)
 Give [place] 2-3 business days to get the results of your labs back.   Keep the diet clean and stay active.  Heat (pad or rice pillow in microwave) over affected area, 10-15 minutes twice daily.   Ice/cold pack over area for 10-15 min twice daily.  OK to take Tylenol 1000 mg (2 extra strength tabs) or 975 mg (3 regular strength tabs) every 6 hours as needed.  Let us know if you need anything.

## 2024-02-21 ENCOUNTER — Other Ambulatory Visit: Payer: Self-pay | Admitting: Family Medicine

## 2024-02-21 DIAGNOSIS — F411 Generalized anxiety disorder: Secondary | ICD-10-CM

## 2024-02-21 DIAGNOSIS — F4310 Post-traumatic stress disorder, unspecified: Secondary | ICD-10-CM

## 2024-02-21 MED ORDER — ALPRAZOLAM 0.5 MG PO TABS
0.5000 mg | ORAL_TABLET | Freq: Three times a day (TID) | ORAL | 1 refills | Status: DC | PRN
Start: 2024-02-21 — End: 2024-07-31

## 2024-02-21 NOTE — Telephone Encounter (Signed)
 Requesting: alprazolam  0.5mg   Contract: 10/14/22 UDS: 10/14/22 Last Visit: 01/28/24 Next Visit: 07/31/24 Last Refill: 07/30/23 #270 and 1rf  Please Advise

## 2024-03-23 ENCOUNTER — Other Ambulatory Visit: Payer: Self-pay | Admitting: Family Medicine

## 2024-03-28 ENCOUNTER — Ambulatory Visit (INDEPENDENT_AMBULATORY_CARE_PROVIDER_SITE_OTHER): Admitting: Family Medicine

## 2024-03-28 ENCOUNTER — Encounter: Payer: Self-pay | Admitting: Family Medicine

## 2024-03-28 VITALS — BP 130/72 | HR 70 | Temp 98.0°F | Resp 16 | Ht 71.0 in | Wt 214.0 lb

## 2024-03-28 DIAGNOSIS — R1013 Epigastric pain: Secondary | ICD-10-CM

## 2024-03-28 DIAGNOSIS — R1030 Lower abdominal pain, unspecified: Secondary | ICD-10-CM | POA: Diagnosis not present

## 2024-03-28 MED ORDER — FAMOTIDINE 20 MG PO TABS: 20.0000 mg | ORAL_TABLET | Freq: Two times a day (BID) | ORAL | 1 refills | Status: DC | PRN

## 2024-03-28 NOTE — Progress Notes (Signed)
 Chief Complaint  Patient presents with   Abdominal Pain    Abdominal Pain    Subjective: Patient is a 78 y.o. male here for side pain.  Pain, described as burning/aching, on both sides that radiates to the center of his abd. Almost feels that he has a bowel movement, but he does not go. Having a BM does not affect his discomfort. He is still having gas to a lesser extent which does help a little. He has seen some mucus in his stool since this started 7-8 d ago. He is having some stool that is black. No fevers, N/V/D, bright red bleeding, trauma. Has not tried anything at home thus far.    Past Medical History:  Diagnosis Date   Allergy    Anxiety    Arthritis    Back pain, lumbosacral    Back pain, thoracic    Coronary artery disease    normal RCA, LCX, 50% prox/mid LAD with normal FFR 06/09/17 LHC, medical therapy   Depression    Eczema    GERD (gastroesophageal reflux disease)    Prostatitis    PTSD (post-traumatic stress disorder)    RSD (reflex sympathetic dystrophy)    on the right side   Vertigo     Objective: BP 130/72 (BP Location: Left Arm, Patient Position: Sitting)   Pulse 70   Temp 98 F (36.7 C) (Oral)   Resp 16   Ht 5' 11 (1.803 m)   Wt 214 lb (97.1 kg)   SpO2 98%   BMI 29.85 kg/m  General: Awake, appears stated age Heart: RRR, no LE edema Lungs: CTAB, no rales, wheezes or rhonchi. No accessory muscle use Abd: BS+, S, ND, mild ttp in the lower quadrants and epigastric region.  Psych: Age appropriate judgment and insight, normal affect and mood  Assessment and Plan: Lower abdominal pain  Epigastric abdominal pain - Plan: famotidine  (PEPCID ) 20 MG tablet  Suspect gas/stool burden. Mag citrate + prune juice to clear things out. No red flag s/s's minus possible melena. Intermittent. If it returns again, he will contact his GI doc.  Seems to be flaring. Cont Protonix . Add Pepcid  20 mg bid prn.  The patient voiced understanding and agreement to the  plan.  Mabel [place], DO 03/28/24  10:41 AM

## 2024-03-28 NOTE — Patient Instructions (Addendum)
 Heat (pad or rice pillow in microwave) over affected area, 10-15 minutes twice daily.   Ice/cold pack over area for 10-15 min twice daily.  OK to take Tylenol  1000 mg (2 extra strength tabs) or 975 mg (3 regular strength tabs) every 6 hours as needed.  Try 2 tablespoons of milk of mag in 4 oz of warm prune juice. Do that and wait a couple hours. If no improvement, try a Dulcolax suppository and then let me know if we are still having issues.   Call your GI doc regarding your dark stools if they return.   Let us  know if you need anything.

## 2024-05-02 ENCOUNTER — Emergency Department (HOSPITAL_BASED_OUTPATIENT_CLINIC_OR_DEPARTMENT_OTHER)
Admission: EM | Admit: 2024-05-02 | Discharge: 2024-05-02 | Disposition: A | Discharge status: Home / Self Care | Attending: Emergency Medicine | Admitting: Emergency Medicine

## 2024-05-02 ENCOUNTER — Emergency Department (HOSPITAL_BASED_OUTPATIENT_CLINIC_OR_DEPARTMENT_OTHER)

## 2024-05-02 ENCOUNTER — Encounter (HOSPITAL_BASED_OUTPATIENT_CLINIC_OR_DEPARTMENT_OTHER): Payer: Self-pay | Admitting: Emergency Medicine

## 2024-05-02 ENCOUNTER — Other Ambulatory Visit: Payer: Self-pay

## 2024-05-02 DIAGNOSIS — M79641 Pain in right hand: Secondary | ICD-10-CM | POA: Diagnosis present

## 2024-05-02 DIAGNOSIS — R1032 Left lower quadrant pain: Secondary | ICD-10-CM | POA: Diagnosis not present

## 2024-05-02 DIAGNOSIS — I2510 Atherosclerotic heart disease of native coronary artery without angina pectoris: Secondary | ICD-10-CM | POA: Insufficient documentation

## 2024-05-02 DIAGNOSIS — R195 Other fecal abnormalities: Secondary | ICD-10-CM | POA: Insufficient documentation

## 2024-05-02 DIAGNOSIS — Z91040 Latex allergy status: Secondary | ICD-10-CM | POA: Insufficient documentation

## 2024-05-02 DIAGNOSIS — R109 Unspecified abdominal pain: Secondary | ICD-10-CM

## 2024-05-02 LAB — URINALYSIS, ROUTINE W REFLEX MICROSCOPIC
Bilirubin Urine: NEGATIVE
Glucose, UA: NEGATIVE mg/dL
Hgb urine dipstick: NEGATIVE
Ketones, ur: NEGATIVE mg/dL
Leukocytes,Ua: NEGATIVE
Nitrite: NEGATIVE
Protein, ur: NEGATIVE mg/dL
Specific Gravity, Urine: 1.02 (ref 1.005–1.030)
pH: 7 (ref 5.0–8.0)

## 2024-05-02 LAB — COMPREHENSIVE METABOLIC PANEL WITH GFR
ALT: 12 U/L (ref 0–44)
AST: 23 U/L (ref 15–41)
Albumin: 4.5 g/dL (ref 3.5–5.0)
Alkaline Phosphatase: 73 U/L (ref 38–126)
Anion gap: 11 (ref 5–15)
BUN: 12 mg/dL (ref 8–23)
CO2: 27 mmol/L (ref 22–32)
Calcium: 9 mg/dL (ref 8.9–10.3)
Chloride: 102 mmol/L (ref 98–111)
Creatinine, Ser: 0.85 mg/dL (ref 0.61–1.24)
GFR, Estimated: >60 mL/min (ref >60)
Glucose, Bld: 94 mg/dL (ref 70–99)
Potassium: 3.9 mmol/L (ref 3.5–5.1)
Sodium: 140 mmol/L (ref 135–145)
Total Bilirubin: 0.5 mg/dL (ref 0.0–1.2)
Total Protein: 6.5 g/dL (ref 6.5–8.1)

## 2024-05-02 LAB — CBC
HCT: 40.6 % (ref 39.0–52.0)
Hemoglobin: 13.7 g/dL (ref 13.0–17.0)
MCH: 31.1 pg (ref 26.0–34.0)
MCHC: 33.7 g/dL (ref 30.0–36.0)
MCV: 92.1 fL (ref 80.0–100.0)
Platelets: 198 K/uL (ref 150–400)
RBC: 4.41 MIL/uL (ref 4.22–5.81)
RDW: 13.2 % (ref 11.5–15.5)
WBC: 7.3 K/uL (ref 4.0–10.5)
nRBC: 0 % (ref 0.0–0.2)

## 2024-05-02 LAB — OCCULT BLOOD X 1 CARD TO LAB, STOOL: Fecal Occult Bld: NEGATIVE

## 2024-05-02 MED ORDER — ACETAMINOPHEN 500 MG PO TABS
1000.0000 mg | ORAL_TABLET | Freq: Once | ORAL | Status: AC
  Administered 2024-05-02: 1000 mg via ORAL
  Filled 2024-05-02: qty 2

## 2024-05-02 NOTE — ED Notes (Signed)
 Discharge instructions reviewed.   Opportunity for questions and concerns provided.   Alert, oriented and ambulatory.   Displays no signs of distress.   Encouraged to return with worsening flank pain or hand issues. Follow up with PCP as indicated.

## 2024-05-02 NOTE — ED Provider Notes (Cosign Needed Addendum)
 [facility] Provider Note   CSN: [id number] Arrival date & time: 05/02/24  1651     Patient presents with: Hand Injury   Cesar [name] is a 78 y.o. male.    Hand Injury   78 year old male presents emergency department with complaints of right hand pain.  States that he was trying to go to [employer] to get a coffee when he slammed his right hand on the car door.  He is having difficulty moving his right hand secondary to pain.  Patient also reports having left flank pain.  States that he developed this when sitting getting his blood pressure checked.  States that has been persistent since then.  Does report some radiation to his left lower abdomen.  Also describing dark stools for the past 2 days.  Denies any fevers, chills, nausea, vomiting, urinary symptoms.  Denies any blood thinner use, recent Pepto-Bismol use or iron supplement.  Past medical history significant for vertigo, PTSD, GERD, depression, CAD  Prior to Admission medications   Medication Sig Start Date End Date Taking? Authorizing Provider  ALPRAZolam  (XANAX ) 0.5 MG tablet Take 1 tablet (0.5 mg total) by mouth 3 (three) times daily as needed for anxiety. 02/21/24   Frann Mabel Mt, DO  Calcium -Vitamin D 600-200 MG-UNIT tablet Take 1 tablet by mouth daily.    [provider]  ciprofloxacin  (CILOXAN ) 0.3 % ophthalmic solution Administer 4 drops twice daily in the R ear for 7 days. 01/28/24   Frann Mabel Mt, DO  divalproex  (DEPAKOTE  ER) 500 MG 24 hr tablet Take 1 tablet (500 mg total) by mouth daily. 07/30/23   Frann Mabel Mt, DO  famotidine  (PEPCID ) 20 MG tablet Take 1 tablet (20 mg total) by mouth 2 (two) times daily as needed for heartburn or indigestion. 03/28/24   Frann Mabel Mt, DO  FLUoxetine  (PROZAC ) 40 MG capsule TAKE 1 CAPSULE BY MOUTH DAILY 11/15/23   Frann Mabel Mt, DO  hydrOXYzine  (VISTARIL ) 25 MG capsule Take 25 mg by  mouth 3 (three) times daily as needed for anxiety. 03/11/22   [provider]  lovastatin  (MEVACOR ) 10 MG tablet TAKE 1 TABLET BY MOUTH AT BEDTIME FOR CHOLESTEROL 03/23/24   Wendling, Mabel Mt, DO  metoprolol  tartrate (LOPRESSOR ) 25 MG tablet Take 0.5 tablets (12.5 mg total) by mouth 2 (two) times daily. 11/08/23   Frann Mabel Mt, DO  midodrine  (PROAMATINE ) 5 MG tablet Take 1 tablet (5 mg total) by mouth 2 (two) times daily with a meal. 07/30/23   Wendling, Mabel Mt, DO  nitroGLYCERIN  (NITROSTAT ) 0.4 MG SL tablet Place 1 tablet (0.4 mg total) under the tongue every 5 (five) minutes as needed for chest pain. 02/05/21   Frann Mabel Mt, DO  pantoprazole  (PROTONIX ) 40 MG tablet TAKE 1 TABLET BY MOUTH EVERY DAY 02/21/24   Wendling, Mabel Mt, DO    Allergies: Formaldehyde, Latex, Sulfa antibiotics, Butorphanol, Celecoxib, Hydrocodone -acetaminophen , Oxycodone-acetaminophen , Povidone iodine, Metformin hcl, Simvastatin, Tamsulosin, Nifedipine, Nitroglycerin , Povidone-iodine, Quaternium-15, Rofecoxib, and Tape    Review of Systems  All other systems reviewed and are negative.   Updated Vital Signs BP 134/79 (BP Location: Left Arm)   Pulse 70   Temp 98.3 F (36.8 C) (Oral)   Resp 16   Ht 5' 11 (1.803 m)   Wt 97.1 kg   SpO2 98%   BMI 29.85 kg/m   Physical Exam Vitals and nursing note reviewed. Exam conducted with a chaperone present.  Constitutional:  General: He is not in acute distress.    Appearance: He is well-developed.  HENT:     Head: Normocephalic and atraumatic.  Eyes:     Conjunctiva/sclera: Conjunctivae normal.  Cardiovascular:     Rate and Rhythm: Normal rate and regular rhythm.  Pulmonary:     Effort: Pulmonary effort is normal. No respiratory distress.     Breath sounds: Normal breath sounds.  Abdominal:     Palpations: Abdomen is soft.     Comments: Mild tenderness left lower quadrant of abdomen.  Possible left-sided CVA tenderness.   Genitourinary:    Comments: Rectal exam performed with male nursing staff at bedside.  External exam unremarkable.  Small amount of light brown color stool sent via Hemoccult which was negative. Musculoskeletal:        General: Swelling present.     Cervical back: Neck supple.     Comments: Tender palpation dorsal aspect 2nd through 3rd metacarpals mid to distal aspect.  Able to flex and extend digits but with pain.  Radial pulses 2+ bilaterally.  Skin:    General: Skin is warm and dry.     Capillary Refill: Capillary refill takes less than 2 seconds.  Neurological:     Mental Status: He is alert.  Psychiatric:        Mood and Affect: Mood normal.     (all labs ordered are listed, but only abnormal results are displayed) Labs Reviewed - No data to display  EKG: None  Radiology: No results found.   Procedures   Medications Ordered in the ED - No data to display                                  Medical Decision Making Amount and/or Complexity of Data Reviewed Labs: ordered. Radiology: ordered.  Risk OTC drugs.   This patient presents to the ED for concern of hand pain, dark stools, flank pain, this involves an extensive number of treatment options, and is a complaint that carries with it a high risk of complications and morbidity.  The differential diagnosis includes fracture, strain/pain, dislocation, ligamentous/tendinous injury, neurovasc compromise, pyelonephritis, nephrolithiasis, peptic ulcer disease, gastritis, diverticulitis, open   Co morbidities that complicate the patient evaluation  See HPI   Additional history obtained:  Additional history obtained from EMR External records from outside source obtained and reviewed including hospital records   Lab Tests:  I Ordered, and personally interpreted labs.  The pertinent results include: No leukocytosis.  No evidence of anemia.  Platelets within normal range.  Occult negative.  Metabolic panel as well  as UA pending.   Imaging Studies ordered:  I ordered imaging studies including left hand x-ray, CT abdomen I independently visualized and interpreted imaging which showed  Left hand x-ray: No acute osseous abnormality.  Osteoarthritis. CT abdomen: Pending I agree with the radiologist interpretation   Cardiac Monitoring: / EKG:  The patient was maintained on a cardiac monitor.  I personally viewed and interpreted the cardiac monitored which showed an underlying rhythm of: Sinus rhythm   Consultations Obtained:  N/a   Problem List / ED Course / Critical interventions / Medication management  Left hand pain, left flank pain, dark stools I ordered medication including Tylenol    Reevaluation of the patient after these medicines showed that the patient improved I have reviewed the patients home medicines and have made adjustments as needed   Social Determinants of  Health:  Denies tobacco, illicit drug use.   Test / Admission - Considered:  Left hand pain, left flank pain, dark stools Vitals signs within normal range and stable throughout visit. Laboratory/imaging studies significant for: see above 78 year old male presents emergency department with complaints of right hand pain.  States that he was trying to go to [employer] to get a coffee when he slammed his right hand on the car door.  He is having difficulty moving his right hand secondary to pain.  Patient also reports having left flank pain.  States that he developed this when sitting getting his blood pressure checked.  States that has been persistent since then.  Does report some radiation to his left lower abdomen.  Also describing dark stools for the past 2 days.  Denies any fevers, chills, nausea, vomiting, urinary symptoms.  Denies any blood thinner use, recent Pepto-Bismol use or iron supplement. On exam initially presenting right hand, dorsal tenderness 2nd through 4th metacarpals.  Overlying soft tissue swelling.   X-ray obtained showed no acute osseous abnormality.  No neurovasc compromise on exam.  Patient reassured by findings.  Will recommend symptomatic therapy follow-up with primary care in the outpatient setting.  While initially was evaluated, patient was describing dark stools as well as sudden onset left flank pain.  On exam, left-sided CVA tenderness with reported pain rating to his left lower abdomen.  Awaiting CT imaging as well as UA for further assessment regarding the symptoms.  Regarding dark stools, normal CBC with Hemoccult negative; awaiting metabolic panel for possible BUN elevation for concern for possible upper GI bleed.  At time of shift change, patient carried off to [employer].  Patient stable upon shift change.      Final diagnoses:  None    ED Discharge Orders     None          Silver Wonda LABOR, [place] 05/02/24 1840    Dreama Longs, MD 05/04/24 2121

## 2024-05-02 NOTE — ED Provider Notes (Signed)
 Accepted handoff at shift change from [facility]. Please see prior provider note for more detail.   Briefly: Patient is 78 y.o.   DDX: concern for Here for slamming hand into car door. Xray fine, normal ROM on exam. After, started reporting dark stools and new flank pain. On exam, brown stool, hemoccult negative, BUN normal. 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 Pending CT stone study. Lives by himself.  Plan: I independently interpreted imaging including CT renal stone study which shows there is a 3 mm intrarenal stone, but no obstructing stone.  Overall based on his clinical presentation I have low clinical suspicion for renal colic, suspect musculoskeletal etiology of his left-sided flank pain.. I agree with the radiologist interpretation.  Encourage ibuprofen, Tylenol , ice, PCP follow-up.  Patient understands and agrees to plan, is discharged in stable condition at this time.    Rosan Sherlean VEAR DEVONNA 05/02/24 2036    Dreama Longs, MD 05/04/24 2121

## 2024-05-02 NOTE — Discharge Instructions (Addendum)
 Please use Tylenol  or ibuprofen for pain.  You may use 600 mg ibuprofen every 6 hours or 1000 mg of Tylenol  every 6 hours.  You may choose to alternate between the 2.  This would be most effective.  Not to exceed 4 g of Tylenol  within 24 hours.  Not to exceed 3200 mg ibuprofen 24 hours.  You can apply ice to the affected hand around 15 minutes at a time up to twice an hour.  The swelling should improve with time.  Please follow-up with your primary care doctor if you continue to have pain in the flank or the hand despite treatment.

## 2024-05-02 NOTE — ED Triage Notes (Addendum)
 Pt c/o right hand pain after a car door closed on it, pt reports unable to move fingers

## 2024-06-26 ENCOUNTER — Ambulatory Visit: Payer: Self-pay

## 2024-06-26 NOTE — Telephone Encounter (Signed)
 FYI Only or Action Required?: FYI only for provider.  Patient was last seen in primary care on 03/28/2024 by Frann Mabel Mt, DO.  Called Nurse Triage reporting Phlegm each morning.  Symptoms began 4 days ago.  Interventions attempted: Nothing.  Symptoms are: unchanged.  Triage Disposition: See PCP When Office is Open (Within 3 Days)  Patient/caregiver understands and will follow disposition?: Yes           Copied from CRM #[id number]. Topic: Clinical - Red Word Triage >> Jun 26, 2024 12:14 PM Charlet HERO wrote: Red Word that prompted transfer to Nurse Triage: Patient is stating that for the last 4 days he has alot of mucus that he coughs up every morning it is yellow, he is stating that it may happen thru the day and it makes his chest hurt.  [place] Reason for Disposition  Cough has been present for > 3 weeks  Answer Assessment - Initial Assessment Questions 1. ONSET: When did the cough begin?      Not really a cough - brings up phlegm in the mornings 2. SEVERITY: How bad is the cough today?      Not really a cough 3. SPUTUM: Describe the color of your sputum (e.g., none, dry cough; clear, white, yellow, green)     Clear with a tinge of green yellow 4. HEMOPTYSIS: Are you coughing up any blood? If Yes, ask: How much? (e.g., flecks, streaks, tablespoons, etc.)     no 5. DIFFICULTY BREATHING: Are you having difficulty breathing? If Yes, ask: How bad is it? (e.g., mild, moderate, severe)      no 6. FEVER: Do you have a fever? If Yes, ask: What is your temperature, how was it measured, and when did it start?     no 7. CARDIAC HISTORY: Do you have any history of heart disease? (e.g., heart attack, congestive heart failure)      no 8. LUNG HISTORY: Do you have any history of lung disease?  (e.g., pulmonary embolus, asthma, emphysema)     no  10. OTHER SYMPTOMS: Do you have any other symptoms? (e.g., runny nose, wheezing, chest  pain)       no  Protocols used: Cough - Acute Productive-A-AH

## 2024-06-26 NOTE — Telephone Encounter (Signed)
 Appt scheduled

## 2024-06-27 ENCOUNTER — Encounter: Payer: Self-pay | Admitting: Family Medicine

## 2024-06-27 ENCOUNTER — Ambulatory Visit: Admitting: Family Medicine

## 2024-06-27 ENCOUNTER — Other Ambulatory Visit: Payer: Self-pay | Admitting: Family Medicine

## 2024-06-27 VITALS — BP 120/76 | HR 71 | Temp 98.0°F | Resp 16 | Ht 71.0 in | Wt 217.0 lb

## 2024-06-27 DIAGNOSIS — M79661 Pain in right lower leg: Secondary | ICD-10-CM | POA: Diagnosis not present

## 2024-06-27 DIAGNOSIS — J069 Acute upper respiratory infection, unspecified: Secondary | ICD-10-CM

## 2024-06-27 MED ORDER — LOVASTATIN 10 MG PO TABS: 10.0000 mg | ORAL_TABLET | Freq: Every day | ORAL | 3 refills | Status: AC

## 2024-06-27 MED ORDER — BENZONATATE 200 MG PO CAPS: 200.0000 mg | ORAL_CAPSULE | Freq: Two times a day (BID) | ORAL | 0 refills | Status: DC | PRN

## 2024-06-27 NOTE — Patient Instructions (Signed)
 Continue to push fluids, practice good hand hygiene, and cover your mouth if you cough. ? ?If you start having fevers, shaking or shortness of breath, seek immediate care. ? ?OK to take Tylenol 1000 mg (2 extra strength tabs) or 975 mg (3 regular strength tabs) every 6 hours as needed. ? ?Let us know if you need anything. ?

## 2024-06-27 NOTE — Progress Notes (Signed)
 Chief Complaint  Patient presents with   Medication Problem    Discuss Medication     Cesar [name] here for URI complaints.  Duration: 3 days  Associated symptoms: sinus congestion, rhinorrhea, and coughing Denies: sinus pain, itchy watery eyes, ear pain, ear drainage, sore throat, wheezing, shortness of breath, myalgia, and fevers Treatment to date: none Sick contacts: No  Patient has had swelling in his right lower extremity over the past couple months.  He has associated pain in the calf.  Mild swelling of the left side but nothing compared to the right.  He does stay physically active around 3 days/week.  Denies any trauma.  No bruising or redness.  As iterated above, he is not having any shortness of breath.  Past Medical History:  Diagnosis Date   Allergy    Anxiety    Arthritis    Back pain, lumbosacral    Back pain, thoracic    Coronary artery disease    normal RCA, LCX, 50% prox/mid LAD with normal FFR 06/09/17 LHC, medical therapy   Depression    Eczema    GERD (gastroesophageal reflux disease)    Prostatitis    PTSD (post-traumatic stress disorder)    RSD (reflex sympathetic dystrophy)    on the right side   Vertigo     Objective BP 120/76 (BP Location: Left Arm, Patient Position: Sitting)   Pulse 71   Temp 98 F (36.7 C) (Oral)   Resp 16   Ht 5' 11 (1.803 m)   Wt 217 lb (98.4 kg)   SpO2 98%   BMI 30.27 kg/m  General: Awake, alert, appears stated age HEENT: AT, [place], ears patent b/l and TM's neg, nares patent w/o discharge, pharynx pink and without exudates, MMM, no sinus TTP Neck: No masses or asymmetry Heart: RRR, 2+ pitting right-sided lower extremity edema tapering at the knee, 1+ pitting left-sided lower extremity edema tapering at the mid tibia MSK: TTP over the medial right calf, no TTP in the left lower extremity Lungs: CTAB, no accessory muscle use Psych: Age appropriate judgment and insight, normal mood and affect  Viral URI with cough -  Plan: benzonatate (TESSALON) 200 MG capsule  Right calf pain - Plan: US  Venous Img Lower Unilateral Right  Likely viral.  Tessalon Perles as needed.  Send message in 1 week if not improving.  Continue to push fluids, practice good hand hygiene, cover mouth when coughing. F/u prn. If starting to experience fevers, shaking, or shortness of breath, seek immediate care. New diagnosis with uncertain prognosis.  Check lower extremity duplex.  If worsening symptoms, go to the ER. Pt voiced understanding and agreement to the plan.  Mabel Mt [place], DO 06/27/24 3:12 PM

## 2024-06-28 ENCOUNTER — Ambulatory Visit: Payer: Self-pay | Admitting: Family Medicine

## 2024-06-28 ENCOUNTER — Ambulatory Visit (HOSPITAL_BASED_OUTPATIENT_CLINIC_OR_DEPARTMENT_OTHER)
Admission: RE | Admit: 2024-06-28 | Discharge: 2024-06-28 | Disposition: A | Discharge status: Home / Self Care | Source: Ambulatory Visit | Attending: Family Medicine | Admitting: Family Medicine

## 2024-06-28 DIAGNOSIS — M79661 Pain in right lower leg: Secondary | ICD-10-CM | POA: Diagnosis present

## 2024-07-11 ENCOUNTER — Ambulatory Visit (HOSPITAL_BASED_OUTPATIENT_CLINIC_OR_DEPARTMENT_OTHER)
Admission: RE | Admit: 2024-07-11 | Discharge: 2024-07-11 | Disposition: A | Discharge status: Home / Self Care | Source: Ambulatory Visit | Attending: Family Medicine | Admitting: Family Medicine

## 2024-07-11 ENCOUNTER — Ambulatory Visit: Admitting: Family Medicine

## 2024-07-11 ENCOUNTER — Encounter: Payer: Self-pay | Admitting: Family Medicine

## 2024-07-11 VITALS — BP 124/80 | HR 71 | Temp 98.0°F | Resp 16 | Ht 71.0 in | Wt 222.4 lb

## 2024-07-11 DIAGNOSIS — R0601 Orthopnea: Secondary | ICD-10-CM | POA: Insufficient documentation

## 2024-07-11 DIAGNOSIS — M79662 Pain in left lower leg: Secondary | ICD-10-CM | POA: Diagnosis not present

## 2024-07-11 MED ORDER — AZITHROMYCIN 250 MG PO TABS: ORAL_TABLET | ORAL | 0 refills | Status: DC

## 2024-07-11 MED ORDER — PANTOPRAZOLE SODIUM 40 MG PO TBEC: 40.0000 mg | DELAYED_RELEASE_TABLET | Freq: Every day | ORAL | 1 refills | Status: AC

## 2024-07-11 MED ORDER — METOPROLOL TARTRATE 25 MG PO TABS: 12.5000 mg | ORAL_TABLET | Freq: Two times a day (BID) | ORAL | 2 refills | Status: AC

## 2024-07-11 NOTE — Progress Notes (Signed)
 Chief Complaint  Patient presents with   Follow-up    Follow Up Cough    Subjective: Patient is a 78 y.o. male here for coughing.  When he lays down he will start coughing. This gets better when he sits up. He has associated SOB.  Is fine when he sits up.  No fevers, runny/stuffy nose, sinus pain, ear pain/drainage, itchy/watery eyes, sore throat, chest pain.  He has gained some weight.  He does have swelling in his legs and pain in his right calf.  He had an ultrasound done showing no DVT.  Past Medical History:  Diagnosis Date   Allergy    Anxiety    Arthritis    Back pain, lumbosacral    Back pain, thoracic    Coronary artery disease    normal RCA, LCX, 50% prox/mid LAD with normal FFR 06/09/17 LHC, medical therapy   Depression    Eczema    GERD (gastroesophageal reflux disease)    Prostatitis    PTSD (post-traumatic stress disorder)    RSD (reflex sympathetic dystrophy)    on the right side   Vertigo     Objective: BP 124/80 (BP Location: Left Arm, Patient Position: Sitting)   Pulse 71   Temp 98 F (36.7 C) (Oral)   Resp 16   Ht 5' 11 (1.803 m)   Wt 222 lb 6.4 oz (100.9 kg)   SpO2 98%   BMI 31.02 kg/m  General: Awake, appears stated age Heart: RRR, 3+ pitting LE edema on R, 2+ pitting edema on L Lungs: CTAB, no rales, wheezes or rhonchi. No accessory muscle use MSK: TTP over the right medial calf Psych: Age appropriate judgment and insight, normal affect and mood  Assessment and Plan: Orthopnea - Plan: DG Chest 2 View  Pain of left calf  I do not see any obvious lesions or edema on his x-ray.  Will send in a Z-Pak to cover for bacterial bronchitis.  If the radiologist is concerned about edema, would send in a loop diuretic. Now that a DVT has been ruled out, we will have him rehab the area.  Hopefully this will help with the swelling on that side as well.  Send message if not improving and we will set him up with physical therapy. The patient voiced  understanding and agreement to the plan.  Mabel Mt [place], DO 07/11/24  4:38 PM

## 2024-07-11 NOTE — Patient Instructions (Signed)
 We will be in touch regarding your X-ray results.   Heat (pad or rice pillow in microwave) over affected area, 10-15 minutes twice daily.   Ice/cold pack over area for 10-15 min twice daily.  Let us  know if you need anything.  Stretching and range of motion exercises These exercises warm up your muscles and joints and improve the movement and flexibility of your lower leg. These exercises also help to relieve pain and stiffness.  Exercise A: Gastrocnemius stretch Sit with your left / right leg extended. Loop a belt or towel around the ball of your left / right foot. The ball of your foot is on the walking surface, right under your toes. Hold both ends of the belt or towel. Keep your left / right ankle and foot relaxed and keep your knee straight while you use the belt or towel to pull your foot and ankle toward you. Stop at the first point of resistance. Hold this position for 30 seconds. Repeat 2 times. Complete this exercise 3 times per week.  Exercise B: Ankle alphabet Sit with your left / right leg supported at the lower leg. Do not rest your foot on anything. Make sure your foot has room to move freely. Think of your left / right foot as a paintbrush, and move your foot to trace each letter of the alphabet in the air. Keep your hip and knee still while you trace. Trace every letter from A to Z. Repeat 2 times. Complete this exercise 3 times per week.  Strengthening exercises These exercises build strength and endurance in your lower leg. Endurance is the ability to use your muscles for a long time, even after they get tired.  Exercise C: Plantar flexors with band Sit with your left / right leg extended. Loop a rubber exercise band or tube around the ball of your left / right foot. The ball of your foot is on the walking surface, right under your toes. While holding both ends of the band or tube, slowly point your toes downward, pushing them away from you. Hold this position for  3 seconds. Slowly return your foot to the starting position and repeat for a total of 10 repetitions. Repeat 2 times. Complete this exercise 3 times per week.  Exercise D: Plantar flexors, standing Stand with your feet shoulder-width apart. Place your hands on a wall or table to steady yourself as needed, but try not to use it very much for support. Rise up on your toes. If this exercise is too easy, try these options: Shift your weight toward your left / right leg until you feel challenged. If told by your health care provider, stand on your left / right foot only. Hold this position for 3 seconds. Repeat for a total of 10 repetitions. Repeat 2 times. Complete this exercise 3 times per week.  Exercise E: Plantar flexors, eccentric Stand on the balls of your feet on the edge of a step. The ball of your foot is on the walking surface, right under your toes. Place your hands on a wall or railing for balance as needed, but try not to lean on it for support. Rise up on your toes, using both legs to help. Slowly shift all of your weight to your left / right foot and lift your other foot off the step. Slowly lower your left / right heel so it drops below the level of the step. You will feel a slight stretch in your left / right  calf. Put your other foot back onto the step. Repeat 2 times. Complete this exercise 3 times per week. This information is not intended to replace advice given to you by your health care provider. Make sure you discuss any questions you have with your health care provider.

## 2024-07-15 ENCOUNTER — Ambulatory Visit: Payer: Self-pay | Admitting: Family Medicine

## 2024-07-31 ENCOUNTER — Ambulatory Visit: Admitting: Family Medicine

## 2024-07-31 ENCOUNTER — Other Ambulatory Visit (HOSPITAL_BASED_OUTPATIENT_CLINIC_OR_DEPARTMENT_OTHER): Payer: Self-pay

## 2024-07-31 ENCOUNTER — Encounter: Payer: Self-pay | Admitting: Family Medicine

## 2024-07-31 VITALS — BP 122/82 | HR 78 | Temp 98.0°F | Resp 16 | Ht 71.0 in | Wt 220.4 lb

## 2024-07-31 DIAGNOSIS — F411 Generalized anxiety disorder: Secondary | ICD-10-CM

## 2024-07-31 DIAGNOSIS — M79661 Pain in right lower leg: Secondary | ICD-10-CM

## 2024-07-31 DIAGNOSIS — F4310 Post-traumatic stress disorder, unspecified: Secondary | ICD-10-CM | POA: Diagnosis not present

## 2024-07-31 MED ORDER — ALPRAZOLAM 0.5 MG PO TABS: 0.5000 mg | ORAL_TABLET | Freq: Three times a day (TID) | ORAL | 1 refills | Status: AC | PRN

## 2024-07-31 MED ORDER — COMIRNATY 30 MCG/0.3ML IM SUSY
0.3000 mL | PREFILLED_SYRINGE | Freq: Once | INTRAMUSCULAR | 0 refills | Status: AC
  Filled 2024-07-31: qty 0.3, 1d supply, fill #0

## 2024-07-31 MED ORDER — METHYLPREDNISOLONE ACETATE 80 MG/ML IJ SUSP
80.0000 mg | Freq: Once | INTRAMUSCULAR | Status: AC
  Administered 2024-07-31: 80 mg via INTRAMUSCULAR

## 2024-07-31 MED ORDER — FLUOXETINE HCL 40 MG PO CAPS: 40.0000 mg | ORAL_CAPSULE | Freq: Every day | ORAL | 3 refills | Status: AC

## 2024-07-31 NOTE — Patient Instructions (Addendum)
 Ice/cold pack over area for 10-15 min twice daily.  OK to take Tylenol  1000 mg (2 extra strength tabs) or 975 mg (3 regular strength tabs) every 6 hours as needed.  Heat (pad or rice pillow in microwave) over affected area, 10-15 minutes twice daily.   If you do not hear anything about your referral in the next 1-2 weeks, call our office and ask for an update.  Let us  know if you need anything.

## 2024-07-31 NOTE — Progress Notes (Signed)
 Chief Complaint  Patient presents with   Follow-up    Follow Up    Subjective: Patient is a 78 y.o. male here for f/u.  Patient continues to have right calf pain and swelling.  This has been going on for several months now.  He had a scan showing no clots.  He has been doing stretches and exercises without significant relief.  No bruising or redness.  No neurologic signs or symptoms.  Past Medical History:  Diagnosis Date   Allergy    Anxiety    Arthritis    Back pain, lumbosacral    Back pain, thoracic    Coronary artery disease    normal RCA, LCX, 50% prox/mid LAD with normal FFR 06/09/17 LHC, medical therapy   Depression    Eczema    GERD (gastroesophageal reflux disease)    Prostatitis    PTSD (post-traumatic stress disorder)    RSD (reflex sympathetic dystrophy)    on the right side   Vertigo     Objective: BP 122/82 (BP Location: Left Arm, Patient Position: Sitting)   Pulse 78   Temp 98 F (36.7 C) (Oral)   Resp 16   Ht 5' 11 (1.803 m)   Wt 220 lb 6.4 oz (100 kg)   BMI 30.74 kg/m  General: Awake, appears stated age MSK: TTP over the right calf region medially, lower extremity edema noted circumferentially from the ankle to the knee on the right Lungs: No accessory muscle use Psych: Age appropriate judgment and insight, normal affect and mood  Assessment and Plan: Right calf pain - Plan: Ambulatory referral to Sports Medicine, methylPREDNISolone  acetate (DEPO-MEDROL ) injection 80 mg  GAD (generalized anxiety disorder) - Plan: ALPRAZolam  (XANAX ) 0.5 MG tablet, FLUoxetine  (PROZAC ) 40 MG capsule  PTSD (post-traumatic stress disorder) - Plan: ALPRAZolam  (XANAX ) 0.5 MG tablet, FLUoxetine  (PROZAC ) 40 MG capsule  Depo-Medrol  injection today.  Refer to sports med for their opinion.  DVT result negative on imaging.  Heat, ice, Tylenol . The patient voiced understanding and agreement to the plan.  Mabel Mt [place], DO 07/31/24  1:45 PM

## 2024-08-01 ENCOUNTER — Ambulatory Visit (INDEPENDENT_AMBULATORY_CARE_PROVIDER_SITE_OTHER)

## 2024-08-01 ENCOUNTER — Other Ambulatory Visit: Payer: Self-pay

## 2024-08-01 VITALS — BP 112/64 | Ht 71.0 in | Wt 220.0 lb

## 2024-08-01 DIAGNOSIS — L97211 Non-pressure chronic ulcer of right calf limited to breakdown of skin: Secondary | ICD-10-CM

## 2024-08-01 DIAGNOSIS — M79661 Pain in right lower leg: Secondary | ICD-10-CM

## 2024-08-01 DIAGNOSIS — I890 Lymphedema, not elsewhere classified: Secondary | ICD-10-CM

## 2024-08-01 DIAGNOSIS — I83012 Varicose veins of right lower extremity with ulcer of calf: Secondary | ICD-10-CM

## 2024-08-01 MED ORDER — NAPROXEN 500 MG PO TABS: 500.0000 mg | ORAL_TABLET | Freq: Two times a day (BID) | ORAL | 0 refills | Status: DC

## 2024-08-01 NOTE — Progress Notes (Signed)
   Subjective:    Patient ID: Cesar [name], male    DOB: 78 y.o., 02/27/1946   MRN: [id number]  Chief Complaint: Right chronic calf pain and swelling  Discussed the use of AI scribe software for clinical note transcription with the patient, who gave verbal consent to proceed.  History of Present Illness 78 year old male with past medical history significant for angina, cervical myelopathy, GERD, PTSD, chronic low back pain presenting with a several month history of atraumatic  right calf pain.  He was last evaluated for this by Dr. Frann on 07/31/2024 --DVT ultrasound negative --Refractory to heat, ice, Tylenol  --Provided with Depo-Medrol  injection on 07/31/2024  He reports to me that he has historically been a frequent walker with up to 2 to 3 miles of walking per day. Then reports a deep joint ache that seems to be present for the majority of this past year with some associated swelling in his right calf He has noted some increased swelling in his left calf and lower extremity as well but this is to a lesser extent  Review of pertinent imaging: 06/28/2024 lower extremity DVT duplex ultrasound Normal direction of venous flow, no filling defects, normal compressibility of all veins examined. Impression: Normal     Objective:   There were no vitals filed for this visit.  Right calf Erythema present over the right lower extremity Increased caliber of the right lower extremity compared to the left 2+ pitting edema present (trace to 1+ pitting edema in the contralateral lower extremity)   Limited ultrasound of the Right Calf: Significant swelling throughout the soft tissues and cobblestoning present in the calf. Large, tortuous, distended appearing intramuscular veins with near absent Doppler flow Small hyperechogenic disorganized tissue at the myotendinous junction of the medial gastrocnemius Impression: Small partial Tear with residual scar tissue  Significant  subcutaneous edema Large intramuscular varicose veins with little to no slow intraluminal     Assessment & Plan:   Assessment & Plan Cesar [name] is a pleasant 78 year old with going on 1 year of swelling and pain in his right lower leg.  At this point, the etiology of his pain is not altogether clear to me.  He has impressively large intramuscular tortuous varicose veins with almost no flow raising concern for a deep thrombophlebitis without thrombosis.  His subcutaneous edema could either be resultant from that or could be related to lymphedematous change and he does have some evidence of skin ulceration though no overt weeping present.  The erythematous change noted in his skin in this area could also be related to lymphedematous change.  Very much doubt cellulitis or deep soft tissue infection based on exam and imaging today.  Could consider echo to r/o CHF.  Overall no clear, obvious etiology but starting with conservative measures like compression stockings, awaiting effect of Depo-Medrol  IM injection to take hold, and starting oral anti-inflammatory medication approximately 4 days hence I believe is a reasonable place to start. Follow up in 2-3 weeks to re-assess.    Lab Results  Component Value Date   CREATININE 0.85 05/02/2024

## 2024-08-15 ENCOUNTER — Other Ambulatory Visit: Payer: Self-pay | Admitting: Family Medicine

## 2024-08-15 ENCOUNTER — Ambulatory Visit

## 2024-08-15 VITALS — BP 120/86 | Ht 71.0 in | Wt 220.0 lb

## 2024-08-15 DIAGNOSIS — M79661 Pain in right lower leg: Secondary | ICD-10-CM

## 2024-08-15 DIAGNOSIS — I890 Lymphedema, not elsewhere classified: Secondary | ICD-10-CM

## 2024-08-15 DIAGNOSIS — R1013 Epigastric pain: Secondary | ICD-10-CM

## 2024-08-15 DIAGNOSIS — I83012 Varicose veins of right lower extremity with ulcer of calf: Secondary | ICD-10-CM

## 2024-08-15 NOTE — Progress Notes (Signed)
   Subjective:    Patient ID: Cesar [name], male    DOB: 78 y.o., [birth year]   MRN: [id number]  Chief Complaint: Right lower extremity lymphedema, varicose veins  Discussed the use of AI scribe software for clinical note transcription with the patient, who gave verbal consent to proceed.  History of Present Illness Cesar [name] is a 78 year old male presenting for follow-up on swelling, pain in his right lower extremity and calf.  He endorses that he was unable to pick up an appropriately sized stocking for his right lower extremity for compression after last visit.  He was able to call pharmacy and confirm that 1 is in stock and he will pick it up on his way home from this visit.  Does feel that the anti-inflammatory prescribed by myself after last visit has provided some pain relief though it is still sore.  Swelling is similar to prior.  Redness also similar.  No drainage.  No new numbness or tingling.     Objective:   Vitals:   08/15/24 1002  BP: 120/86   Right lower extremity: Mild erythema present.  No areas of skin breakdown visible. There continues to be pitting edema present in the right lower extremity and slightly increased caliber in the right calf compared to the left.  Modest tenderness to palpation near the myotendinous junction of the gastrocnemius on the medial portion of the calf.  Left lower extremity: Mild erythema present.  No areas of skin breakdown visible. Mild pitting edema.    Assessment & Plan:   Assessment & Plan Cesar [name] continues to experience right lower extremity pain, redness, swelling but given that he has been unable to initiate compression therapy I do not believe that a change in plan is warranted at this time.  I am okay with him continuing the anti-inflammatory that I prescribed him if this is providing some pain relief, but will have a low threshold to discontinue given that the benefit seems small.  I recommended that he message me in approximately 7-10  days to notify me of the progress he has made with the swelling and pain with compression.  Depending on the pain relief he has obtained with regular compressive therapy, we will likely move to obtaining an echocardiogram to assess for a cardiogenic source for the swelling.  He also commented to me that he is planning to stop by biscuit [employer] after this visit and I do believe that high sodium intake is likely also contributing to his current edematous picture in his lower extremity.  Will discuss this on follow-up after receiving message from patient.

## 2024-09-04 ENCOUNTER — Ambulatory Visit

## 2024-09-04 DIAGNOSIS — M79661 Pain in right lower leg: Secondary | ICD-10-CM | POA: Diagnosis not present

## 2024-09-04 DIAGNOSIS — I890 Lymphedema, not elsewhere classified: Secondary | ICD-10-CM | POA: Diagnosis present

## 2024-09-04 DIAGNOSIS — I2089 Other forms of angina pectoris: Secondary | ICD-10-CM | POA: Diagnosis not present

## 2024-09-04 MED ORDER — NAPROXEN 500 MG PO TABS: 500.0000 mg | ORAL_TABLET | Freq: Two times a day (BID) | ORAL | 0 refills | Status: DC

## 2024-09-04 NOTE — Progress Notes (Signed)
" ° °  Subjective:    Patient ID: Cesar [name], male    DOB: 78 y.o., March 01, 1946   MRN: [id number]  Chief Complaint: Right lower extremity swelling/pain, lymphedema  History of Present Illness Cesar [name] reports continued pain since his last visit with myself.  It appears that the swelling has improved slightly with regular use of compression stockings.  Naproxen  use has helped to some degree though he has taken this intermittently and not consistently.  No weeping of legs.     Objective:   Vitals:   09/04/24 1359  BP: 120/78    Const: appears well, non-toxic, well groomed Psych: affect bright, interactive, smiling EENT: EOMI intact, conjunctiva appear normal Neck: no obvious masses, appears symmetric Resp: non-labored, appears symmetric Neuro: muscle bulk appears normal Skin: no obvious rashes noted  Cesar [name]'s right leg appears slightly less edematous and slightly less tender to palpation than previously.     Assessment & Plan:   Assessment & Plan I feel that ruling out right sided heart failure would be appropriate at this time given further evaluating Cesar [name]'s right sided lower leg swelling/pain after DVT and calf Injury have been successfully out.  If echocardiogram comes back normal, could consider laboratory evaluation for myopathy or consider switching from lovastatin .  Provided short refill on naproxen .   "

## 2024-09-08 ENCOUNTER — Ambulatory Visit (HOSPITAL_COMMUNITY)
Admission: RE | Admit: 2024-09-08 | Discharge: 2024-09-08 | Disposition: A | Discharge status: Home / Self Care | Source: Ambulatory Visit

## 2024-09-08 ENCOUNTER — Ambulatory Visit: Payer: Self-pay

## 2024-09-08 DIAGNOSIS — I351 Nonrheumatic aortic (valve) insufficiency: Secondary | ICD-10-CM | POA: Insufficient documentation

## 2024-09-08 DIAGNOSIS — I358 Other nonrheumatic aortic valve disorders: Secondary | ICD-10-CM | POA: Diagnosis not present

## 2024-09-08 DIAGNOSIS — M79661 Pain in right lower leg: Secondary | ICD-10-CM | POA: Insufficient documentation

## 2024-09-08 DIAGNOSIS — I890 Lymphedema, not elsewhere classified: Secondary | ICD-10-CM | POA: Insufficient documentation

## 2024-09-08 DIAGNOSIS — M7989 Other specified soft tissue disorders: Secondary | ICD-10-CM | POA: Insufficient documentation

## 2024-09-08 DIAGNOSIS — I2089 Other forms of angina pectoris: Secondary | ICD-10-CM | POA: Insufficient documentation

## 2024-09-08 DIAGNOSIS — E785 Hyperlipidemia, unspecified: Secondary | ICD-10-CM | POA: Diagnosis not present

## 2024-09-08 DIAGNOSIS — I77810 Thoracic aortic ectasia: Secondary | ICD-10-CM | POA: Insufficient documentation

## 2024-09-08 LAB — ECHOCARDIOGRAM COMPLETE
Area-P 1/2: 3.46 cm2
S' Lateral: 2.7 cm

## 2024-09-08 MED ORDER — PERFLUTREN LIPID MICROSPHERE
1.0000 mL | INTRAVENOUS | Status: AC | PRN
  Administered 2024-09-08: 3 mL via INTRAVENOUS

## 2024-09-22 ENCOUNTER — Ambulatory Visit: Admitting: Family Medicine

## 2024-09-22 ENCOUNTER — Encounter: Payer: Self-pay | Admitting: Family Medicine

## 2024-09-22 VITALS — BP 128/80 | HR 77 | Temp 98.0°F | Resp 16 | Ht 71.0 in | Wt 217.4 lb

## 2024-09-22 DIAGNOSIS — R0789 Other chest pain: Secondary | ICD-10-CM | POA: Diagnosis not present

## 2024-09-22 DIAGNOSIS — M546 Pain in thoracic spine: Secondary | ICD-10-CM | POA: Diagnosis not present

## 2024-09-22 MED ORDER — METHOCARBAMOL 500 MG PO TABS: 500.0000 mg | ORAL_TABLET | Freq: Three times a day (TID) | ORAL | 0 refills | Status: AC | PRN

## 2024-09-22 NOTE — Patient Instructions (Addendum)
Heat (pad or rice pillow in microwave) over affected area, 10-15 minutes twice daily.  ° °Ice/cold pack over area for 10-15 min twice daily. ° °OK to take Tylenol 1000 mg (2 extra strength tabs) or 975 mg (3 regular strength tabs) every 6 hours as needed. ° °Let us know if you need anything. ° ° °Mid-Back Strain Rehab °It is normal to feel mild stretching, pulling, tightness, or discomfort as you do these exercises, but you should stop right away if you feel sudden pain or your pain gets worse.  ° °Stretching and range of motion exercises °This exercise warms up your muscles and joints and improves the movement and flexibility of your back and shoulders. This exercise also help to relieve pain. °Exercise A: Chest and spine stretch ° °  °Lie down on your back on a firm surface. °Roll a towel or a small blanket so it is about 4 inches (10 cm) in diameter. °Put the towel lengthwise under the middle of your back so it is under your spine, but not under your shoulder blades. °To increase the stretch, you may put your hands behind your head and let your elbows fall to your sides. °Hold for 30 seconds. °Repeat exercise 2 times. Complete this exercise 3 times per week. ° °Strengthening exercises °These exercises build strength and endurance in your back and your shoulder blade muscles. Endurance is the ability to use your muscles for a long time, even after they get tired. °Exercise B: Alternating arm and leg raises ° °  °Get on your hands and knees on a firm surface. If you are on a hard floor, you may want to use padding to cushion your knees, such as an exercise mat. °Line up your arms and legs. Your hands should be below your shoulders, and your knees should be below your hips. °Lift your left leg behind you. At the same time, raise your right arm and straighten it in front of you. °Do not lift your leg higher than your hip. °Do not lift your arm higher than your shoulder. °Keep your abdominal and back muscles  tight. °Keep your hips facing the ground. °Do not arch your back. °Keep your balance carefully, and do not hold your breath. °Hold for 3 seconds. °Slowly return to the starting position and repeat with your right leg and your left arm. °Repeat 2 times. Complete this exercise 3 times per week. °Exercise C: Straight arm rows (shoulder extension)  ° °  °Stand with your feet shoulder width apart. °Secure an exercise band to a stable object in front of you so the band is at or above shoulder height. °Hold one end of the exercise band in each hand. °Straighten your elbows and lift your hands up to shoulder height. °Step back, away from the secured end of the exercise band, until the band stretches. °Squeeze your shoulder blades together and pull your hands down to the sides of your thighs. Stop when your hands are straight down by your sides. Do not let your hands go behind your body. °Hold for 3 seconds. °Slowly return to the starting position. °Repeat 2 times. Complete this exercise 3 times per week. °Exercise D: Shoulder external rotation, prone °Lie on your abdomen on a firm bed so your left / right forearm hangs over the edge of the bed and your upper arm is on the bed, straight out from your body. °Your elbow should be bent. °Your palm should be facing your feet. °If instructed, hold a   5 lb weight in your hand. °Squeeze your shoulder blade toward the middle of your back. Do not let your shoulder lift toward your ear. °Keep your elbow bent in an "L" shape (90 degrees) while you slowly move your forearm up toward the ceiling. Move your forearm up to the height of the bed, toward your head. °Your upper arm should not move. °At the top of the movement, your palm should face the floor. °Hold for 3 seconds. °Slowly return to the starting position and relax your muscles. °Repeat 3 times. Complete this exercise 3 times per week. °Exercise E: Scapular retraction and external rotation, rowing ° °  °Sit in a stable chair  without armrests, or stand. °Secure an exercise band to a stable object in front of you so it is at shoulder height. °Hold one end of the exercise band in each hand. °Bring your arms out straight in front of you. °Step back, away from the secured end of the exercise band, until the band stretches. °Pull the band backward. As you do this, bend your elbows and squeeze your shoulder blades together, but avoid letting the rest of your body move. Do not let your shoulders lift up toward your ears. °Stop when your elbows are at your sides or slightly behind your body. °Hold for 5 seconds. °Slowly straighten your arms to return to the starting position. °Repeat 2 times. Complete this exercise 3 times per week. °Posture and body mechanics ° °  °Body mechanics refers to the movements and positions of your body while you do your daily activities. Posture is part of body mechanics. Good posture and healthy body mechanics can help to relieve stress in your body's tissues and joints. Good posture means that your spine is in its natural S-curve position (your spine is neutral), your shoulders are pulled back slightly, and your head is not tipped forward. The following are general guidelines for applying improved posture and body mechanics to your everyday activities. °Standing ° °  °When standing, keep your spine neutral and your feet about hip-width apart. Keep a slight bend in your knees. Your ears, shoulders, and hips should line up. °When you do a task in which you lean forward while standing in one place for a long time, place one foot up on a stable object that is 2-4 inches (5-10 cm) high, such as a footstool. This helps keep your spine neutral. °Sitting ° °  °When sitting, keep your spine neutral and keep your feet flat on the floor. Use a footrest, if necessary, and keep your thighs parallel to the floor. Avoid rounding your shoulders, and avoid tilting your head forward. °When working at a desk or a computer, keep your  desk at a height where your hands are slightly lower than your elbows. Slide your chair under your desk so you are close enough to maintain good posture. °When working at a computer, place your monitor at a height where you are looking straight ahead and you do not have to tilt your head forward or downward to look at the screen. °Resting ° °  °When lying down and resting, avoid positions that are most painful for you. °If you have pain with activities such as sitting, bending, stooping, or squatting (flexion-based activities), lie in a position in which your body does not bend very much. For example, avoid curling up on your side with your arms and knees near your chest (fetal position). °If you have pain with activities such as standing for   a long time or reaching with your arms (extension-based activities), lie with your spine in a neutral position and bend your knees slightly. Try the following positions: °Lying on your side with a pillow between your knees. °Lying on your back with a pillow under your knees. °  °Lifting ° °  °When lifting objects, keep your feet at least shoulder-width apart and tighten your abdominal muscles. °Bend your knees and hips and keep your spine neutral. It is important to lift using the strength of your legs, not your back. Do not lock your knees straight out. °Always ask for help to lift heavy or awkward objects. °Make sure you discuss any questions you have with your health care provider. ° °

## 2024-09-22 NOTE — Progress Notes (Signed)
 Chief Complaint  Patient presents with   Abdominal Pain    Abdominal Pain    Cesar [name] is here for b/l upper abdominal pain.  Duration: 11 days Nighttime awakenings? No Bleeding? No Weight loss? No Pain is constant.  Palliation: naproxen   Provocation: none No trauma.  Has not improved. Associated symptoms: pain w movement Denies: fever, nausea, vomiting, and bowel changes, bruising, redness, swelling.  Treatment to date: Naproxen   Past Medical History:  Diagnosis Date   Allergy    Anxiety    Arthritis    Back pain, lumbosacral    Back pain, thoracic    Coronary artery disease    normal RCA, LCX, 50% prox/mid LAD with normal FFR 06/09/17 LHC, medical therapy   Depression    Eczema    GERD (gastroesophageal reflux disease)    Prostatitis    PTSD (post-traumatic stress disorder)    RSD (reflex sympathetic dystrophy)    on the right side   Vertigo     BP 128/80 (BP Location: Left Arm, Patient Position: Sitting)   Pulse 77   Temp 98 F (36.7 C) (Oral)   Resp 16   Ht 5' 11 (1.803 m)   Wt 217 lb 6.4 oz (98.6 kg)   SpO2 98%   BMI 30.32 kg/m  Gen.: Awake, alert, appears stated age HEENT: Mucous membranes moist without mucosal lesions Heart: Regular rate and rhythm  Lungs: Clear auscultation bilaterally, no rales or wheezing, normal effort without accessory muscle use. MSK: +TTP over lower rib cage circumferentially and lower thor parasp msc Abdomen: Bowel sounds are present. Abdomen is soft, nontender, nondistended, no masses or organomegaly. Negative Murphy's, Rovsing's, McBurney's, and Carnett's sign. Psych: Age appropriate judgment and insight. Normal mood and affect.  Rib pain  Acute bilateral thoracic back pain  Heat, ice, Tylenol , stretches/exercises. Tylenol , Robaxin  prn. Warned about potential drowsiness.  F/u prn Pt voiced understanding and agreement to the plan.  Mabel [place], DO 09/22/24 3:28 PM

## 2024-10-11 ENCOUNTER — Encounter: Payer: Self-pay | Admitting: Family Medicine

## 2024-10-26 ENCOUNTER — Ambulatory Visit: Payer: Medicare Other

## 2024-10-31 ENCOUNTER — Ambulatory Visit: Admitting: Family Medicine
